# Patient Record
Sex: Female | Born: 1955
Health system: Southern US, Community
[De-identification: ages and names within clinical notes are randomized; demographics above are authoritative.]

## PROBLEM LIST (undated history)

## (undated) DIAGNOSIS — F32A Depression, unspecified: Secondary | ICD-10-CM

## (undated) DIAGNOSIS — K429 Umbilical hernia without obstruction or gangrene: Secondary | ICD-10-CM

## (undated) DIAGNOSIS — K219 Gastro-esophageal reflux disease without esophagitis: Secondary | ICD-10-CM

## (undated) DIAGNOSIS — C4491 Basal cell carcinoma of skin, unspecified: Secondary | ICD-10-CM

## (undated) DIAGNOSIS — J449 Chronic obstructive pulmonary disease, unspecified: Secondary | ICD-10-CM

## (undated) DIAGNOSIS — M1990 Unspecified osteoarthritis, unspecified site: Secondary | ICD-10-CM

## (undated) DIAGNOSIS — IMO0001 Reserved for inherently not codable concepts without codable children: Secondary | ICD-10-CM

## (undated) DIAGNOSIS — F419 Anxiety disorder, unspecified: Secondary | ICD-10-CM

## (undated) DIAGNOSIS — F329 Major depressive disorder, single episode, unspecified: Secondary | ICD-10-CM

## (undated) DIAGNOSIS — C50919 Malignant neoplasm of unspecified site of unspecified female breast: Secondary | ICD-10-CM

## (undated) HISTORY — PX: CHOLECYSTECTOMY: SHX55

## (undated) HISTORY — DX: Major depressive disorder, single episode, unspecified: F32.9

## (undated) HISTORY — DX: Basal cell carcinoma of skin, unspecified: C44.91

## (undated) HISTORY — PX: TUBAL LIGATION: SHX77

## (undated) HISTORY — DX: Chronic obstructive pulmonary disease, unspecified: J44.9

## (undated) HISTORY — DX: Umbilical hernia without obstruction or gangrene: K42.9

## (undated) HISTORY — DX: Malignant neoplasm of unspecified site of unspecified female breast: C50.919

## (undated) HISTORY — DX: Depression, unspecified: F32.A

---

## 2012-05-07 ENCOUNTER — Other Ambulatory Visit (HOSPITAL_COMMUNITY): Payer: Self-pay | Admitting: *Deleted

## 2012-05-22 ENCOUNTER — Encounter (HOSPITAL_COMMUNITY): Payer: Self-pay

## 2012-06-05 ENCOUNTER — Ambulatory Visit (HOSPITAL_COMMUNITY)
Admission: RE | Admit: 2012-06-05 | Discharge: 2012-06-05 | Disposition: A | Discharge status: Home / Self Care | Payer: Self-pay | Source: Ambulatory Visit | Attending: *Deleted | Admitting: *Deleted

## 2012-06-05 ENCOUNTER — Other Ambulatory Visit (HOSPITAL_COMMUNITY): Payer: Self-pay | Admitting: *Deleted

## 2012-06-05 DIAGNOSIS — N63 Unspecified lump in unspecified breast: Secondary | ICD-10-CM | POA: Insufficient documentation

## 2012-06-06 ENCOUNTER — Other Ambulatory Visit: Payer: Self-pay | Admitting: Obstetrics and Gynecology

## 2012-06-06 DIAGNOSIS — N6310 Unspecified lump in the right breast, unspecified quadrant: Secondary | ICD-10-CM

## 2012-06-11 ENCOUNTER — Encounter (HOSPITAL_COMMUNITY): Payer: Self-pay

## 2012-06-11 ENCOUNTER — Ambulatory Visit
Admission: RE | Admit: 2012-06-11 | Discharge: 2012-06-11 | Disposition: A | Discharge status: Home / Self Care | Payer: No Typology Code available for payment source | Source: Ambulatory Visit | Attending: Obstetrics and Gynecology | Admitting: Obstetrics and Gynecology

## 2012-06-11 ENCOUNTER — Ambulatory Visit (HOSPITAL_COMMUNITY)
Admission: RE | Admit: 2012-06-11 | Discharge: 2012-06-11 | Disposition: A | Discharge status: Home / Self Care | Payer: Self-pay | Source: Ambulatory Visit | Attending: Obstetrics and Gynecology | Admitting: Obstetrics and Gynecology

## 2012-06-11 VITALS — BP 118/76 | Ht 67.5 in | Wt 246.6 lb

## 2012-06-11 DIAGNOSIS — N6310 Unspecified lump in the right breast, unspecified quadrant: Secondary | ICD-10-CM

## 2012-06-11 DIAGNOSIS — Z1239 Encounter for other screening for malignant neoplasm of breast: Secondary | ICD-10-CM

## 2012-06-11 HISTORY — DX: Chronic obstructive pulmonary disease, unspecified: J44.9

## 2012-06-11 NOTE — Patient Instructions (Signed)
Taught patient how to perform BSE. Patient did not need a Pap smear today due to last Pap smear was 04/04/2012. Let her know [facility] will cover Pap smears every 3 years unless has a history of abnormal Pap smears. Referred patient to the [facility] for right breast biopsy per recommendation. Appointment scheduled today, Jun 11, 2012 at 1400. Patient aware of appointment and will be there. Patient verbalized understanding.

## 2012-06-11 NOTE — Progress Notes (Signed)
Complaints of right breast lump since December 2013 that patient stated has increased in size. Patient states it is painful at times and she rates the pain at a 2 out of 10. Patient had a diagnostic mammogram on 06/05/2012 at [facility].  Pap Smear:    Pap smear not completed today. Last Pap smear was 04/04/2012 and normal. Per patient no history of an abnormal Pap smear. Last Pap smear result is in EPIC.  Physical exam: Breasts Breasts symmetrical. No skin abnormalities left breast. Right breast dry and patchy red areas around breast. No nipple retraction bilateral breasts. No nipple discharge bilateral breasts. No lymphadenopathy. No lumps palpated left breast. Palpated lump within right breast at 12 o'clock 6 cm from the nipple. Patient complained of tenderness when palpated lump.  Referred patient to the [facility] for right breast biopsy per recommendation. Appointment scheduled today, Jun 11, 2012 at 1400.  Pelvic/Bimanual No Pap smear completed today since last Pap smear was 04/04/2012. Pap smear not indicated per [facility]CP guidelines.

## 2012-06-12 ENCOUNTER — Other Ambulatory Visit (INDEPENDENT_AMBULATORY_CARE_PROVIDER_SITE_OTHER): Payer: Self-pay | Admitting: General Surgery

## 2012-06-12 ENCOUNTER — Other Ambulatory Visit (INDEPENDENT_AMBULATORY_CARE_PROVIDER_SITE_OTHER): Payer: Self-pay | Admitting: Surgery

## 2012-06-12 DIAGNOSIS — C50911 Malignant neoplasm of unspecified site of right female breast: Secondary | ICD-10-CM

## 2012-06-13 ENCOUNTER — Other Ambulatory Visit: Payer: Self-pay | Admitting: *Deleted

## 2012-06-18 ENCOUNTER — Telehealth (INDEPENDENT_AMBULATORY_CARE_PROVIDER_SITE_OTHER): Payer: Self-pay | Admitting: General Surgery

## 2012-06-18 ENCOUNTER — Telehealth (INDEPENDENT_AMBULATORY_CARE_PROVIDER_SITE_OTHER): Payer: Self-pay

## 2012-06-18 ENCOUNTER — Ambulatory Visit (HOSPITAL_COMMUNITY): Payer: Self-pay

## 2012-06-18 ENCOUNTER — Ambulatory Visit
Admission: RE | Admit: 2012-06-18 | Discharge: 2012-06-18 | Disposition: A | Discharge status: Home / Self Care | Payer: No Typology Code available for payment source | Source: Ambulatory Visit | Attending: Surgery | Admitting: Surgery

## 2012-06-18 DIAGNOSIS — C50911 Malignant neoplasm of unspecified site of right female breast: Secondary | ICD-10-CM

## 2012-06-18 NOTE — Telephone Encounter (Signed)
Tracy Neal with [facility] states MRI could not be done on this patient.  # [phone]

## 2012-06-18 NOTE — Telephone Encounter (Signed)
Tracy Neal from [facility] called and stated that patient could do the MRI she started to feel sick and very nerves and Tracy Neal told to see her Family Dr to see if they will give her something for her nerves and if they do, she will re-schedule her MRI

## 2012-06-19 ENCOUNTER — Other Ambulatory Visit (HOSPITAL_BASED_OUTPATIENT_CLINIC_OR_DEPARTMENT_OTHER): Payer: Medicaid Other | Admitting: Lab

## 2012-06-19 ENCOUNTER — Ambulatory Visit
Admission: RE | Admit: 2012-06-19 | Discharge: 2012-06-19 | Disposition: A | Discharge status: Home / Self Care | Payer: No Typology Code available for payment source | Source: Ambulatory Visit | Attending: Radiation Oncology | Admitting: Radiation Oncology

## 2012-06-19 ENCOUNTER — Encounter: Payer: Self-pay | Admitting: *Deleted

## 2012-06-19 ENCOUNTER — Ambulatory Visit (HOSPITAL_BASED_OUTPATIENT_CLINIC_OR_DEPARTMENT_OTHER): Payer: Medicaid Other | Admitting: Surgery

## 2012-06-19 ENCOUNTER — Ambulatory Visit: Payer: No Typology Code available for payment source

## 2012-06-19 ENCOUNTER — Encounter (INDEPENDENT_AMBULATORY_CARE_PROVIDER_SITE_OTHER): Payer: Self-pay | Admitting: Surgery

## 2012-06-19 ENCOUNTER — Encounter: Payer: Self-pay | Admitting: Oncology

## 2012-06-19 ENCOUNTER — Ambulatory Visit: Payer: Medicaid Other | Attending: Surgery | Admitting: Physical Therapy

## 2012-06-19 ENCOUNTER — Ambulatory Visit (HOSPITAL_BASED_OUTPATIENT_CLINIC_OR_DEPARTMENT_OTHER): Payer: Medicaid Other | Admitting: Oncology

## 2012-06-19 VITALS — BP 115/73 | HR 80 | Temp 97.8°F | Resp 20 | Ht 67.5 in | Wt 246.7 lb

## 2012-06-19 DIAGNOSIS — C50119 Malignant neoplasm of central portion of unspecified female breast: Secondary | ICD-10-CM | POA: Insufficient documentation

## 2012-06-19 DIAGNOSIS — Z170 Estrogen receptor positive status [ER+]: Secondary | ICD-10-CM

## 2012-06-19 DIAGNOSIS — C50111 Malignant neoplasm of central portion of right female breast: Secondary | ICD-10-CM

## 2012-06-19 DIAGNOSIS — C50919 Malignant neoplasm of unspecified site of unspecified female breast: Secondary | ICD-10-CM

## 2012-06-19 DIAGNOSIS — G8929 Other chronic pain: Secondary | ICD-10-CM

## 2012-06-19 DIAGNOSIS — C50911 Malignant neoplasm of unspecified site of right female breast: Secondary | ICD-10-CM

## 2012-06-19 DIAGNOSIS — N959 Unspecified menopausal and perimenopausal disorder: Secondary | ICD-10-CM

## 2012-06-19 DIAGNOSIS — M6281 Muscle weakness (generalized): Secondary | ICD-10-CM | POA: Insufficient documentation

## 2012-06-19 DIAGNOSIS — R293 Abnormal posture: Secondary | ICD-10-CM | POA: Insufficient documentation

## 2012-06-19 DIAGNOSIS — IMO0001 Reserved for inherently not codable concepts without codable children: Secondary | ICD-10-CM | POA: Insufficient documentation

## 2012-06-19 LAB — CBC WITH DIFFERENTIAL/PLATELET
BASO%: 0.7 % (ref 0.0–2.0)
Eosinophils Absolute: 0.2 10*3/uL (ref 0.0–0.5)
HCT: 43.6 % (ref 34.8–46.6)
LYMPH%: 29.2 % (ref 14.0–49.7)
MCHC: 33.1 g/dL (ref 31.5–36.0)
MONO#: 1.2 10*3/uL — ABNORMAL HIGH (ref 0.1–0.9)
NEUT#: 5.6 10*3/uL (ref 1.5–6.5)
NEUT%: 55.9 % (ref 38.4–76.8)
Platelets: 256 10*3/uL (ref 145–400)
RBC: 4.94 10*6/uL (ref 3.70–5.45)
WBC: 10 10*3/uL (ref 3.9–10.3)
lymph#: 2.9 10*3/uL (ref 0.9–3.3)

## 2012-06-19 LAB — COMPREHENSIVE METABOLIC PANEL (CC13)
ALT: 17 U/L (ref 0–55)
CO2: 26 mEq/L (ref 22–29)
Calcium: 9.4 mg/dL (ref 8.4–10.4)
Chloride: 106 mEq/L (ref 98–107)
Glucose: 88 mg/dl (ref 70–99)
Sodium: 140 mEq/L (ref 136–145)
Total Bilirubin: 0.35 mg/dL (ref 0.20–1.20)
Total Protein: 7.1 g/dL (ref 6.4–8.3)

## 2012-06-19 NOTE — Progress Notes (Signed)
[facility] Radiation Oncology NEW PATIENT EVALUATION  Name: Tracy Neal MRN: [id number]  Date:   06/19/2012           DOB: 1955-05-08  Status: outpatient   CC: MUSE,ROCHELLE D., PA-C  Shelly Rubenstein, MD Dr. Lance Bosch   REFERRING PHYSICIAN: Shelly Rubenstein, MD   DIAGNOSIS:  Stage IIA (T2 N0 M0) invasive ductal carcinoma of the right breast  HISTORY OF PRESENT ILLNESS:  Tracy Neal is a 57 y.o. female who is seen today at the [facility] for the courtesy of Dr. Magnus Ivan for evaluation of a possible radiation therapy following conservative surgery in the management of her T2 N0 invasive ductal carcinoma of the right breast. She noted a breast mass at 12:00 approximately 6 months ago. She was referred by the [facility] to the Breast Center for mammography on 06/05/2012. She was found to have a 2.8 x 2.8 x 2.8 cm spiculated mass within the middle third of the breast at 12:00. There were no calcifications. On ultrasound the mass measured 2.7 x 1.4 cm. She underwent ultrasound guided core biopsy on 06/11/2012 and this was diagnostic for invasive ductal carcinoma which was ER positive at 90% and PR negative. Ki-67 was 15%. She was too claustrophobic to complete a MRI scan. She is without complaints today. She is seen today with Dr. Magnus Ivan and Dr. Darnelle Catalan.  PREVIOUS RADIATION THERAPY: No   PAST MEDICAL HISTORY:  has a past medical history of COPD (chronic obstructive pulmonary disease); Breast cancer; and Depression.     PAST SURGICAL HISTORY:  Past Surgical History  Procedure Laterality Date  . Cholecystectomy       FAMILY HISTORY: family history includes Diabetes in her mother. her mother died from complications of multiple sclerosis and a stroke at age 7. Her father died from complications of alcoholism at age 61. No family history of breast cancer.   SOCIAL HISTORY:  reports that she has been smoking Cigarettes.  She has a 40 pack-year  smoking history. She has never used smokeless tobacco. She reports that she does not drink alcohol or use illicit drugs. Divorced, 2 sons ages 11 and 45. 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 Currently unemployed.   ALLERGIES: Review of patient's allergies indicates no known allergies.   MEDICATIONS:  Current Outpatient Prescriptions  Medication Sig Dispense Refill  . acetaminophen (TYLENOL) 500 MG tablet Take 500 mg by mouth every 6 (six) hours as needed for pain.      Marland Kitchen albuterol-ipratropium (COMBIVENT) 18-103 MCG/ACT inhaler Inhale 2 puffs into the lungs every 6 (six) hours as needed for wheezing.      . famotidine (PEPCID) 10 MG tablet Take 10 mg by mouth 2 (two) times daily.       No current facility-administered medications for this encounter.     REVIEW OF SYSTEMS:  Pertinent items are noted in HPI.    PHYSICAL EXAM: Alert and oriented 57 year old white female appearing her stated age.  Wt Readings from Last 3 Encounters:  06/19/12 246 lb 11.2 oz (111.902 kg)   Temp Readings from Last 3 Encounters:  06/19/12 97.8 F (36.6 C) Oral   BP Readings from Last 3 Encounters:  06/19/12 115/73   Pulse Readings from Last 3 Encounters:  06/19/12 80   Head and neck examination: Grossly unremarkable except for a 1.8 x 1.2 cm basal cell carcinoma along the medial aspect of her right eye.. Nodes: Without palpable cervical,  supraclavicular, or axillary lymphadenopathy. Chest: Lungs clear. Back: Without spinal or CVA tenderness. Breasts: There is a 2.5 cm mass palpable at approximately 12:00 along the superior aspect of the right breast. There is a small area of ecchymosis related to her biopsy. No other masses are appreciated. Left breast without masses or lesions. Abdomen: Without hepatomegaly. Extremities: Without edema. Neurologic examination: Grossly nonfocal.   LABORATORY DATA:  Lab Results  Component Value Date   WBC 10.0 06/19/2012   HGB 14.4 06/19/2012   HCT 43.6  06/19/2012   MCV 88.2 06/19/2012   PLT 256 06/19/2012   Lab Results  Component Value Date   NA 140 06/19/2012   K 3.8 06/19/2012   CL 106 06/19/2012   CO2 26 06/19/2012   Lab Results  Component Value Date   ALT 17 06/19/2012   AST 17 06/19/2012   ALKPHOS 84 06/19/2012   BILITOT 0.35 06/19/2012      IMPRESSION: Stage IIA (T2 N0 M0) invasive ductal carcinoma of the right breast. I explained to the patient that her local treatment options include mastectomy versus partial mastectomy followed by radiation therapy. She desires breast preservation. We discussed the potential acute and late toxicities of radiation therapy. She lives in [place], and conserving have her radiation therapy closer to home. She may be a candidate for hypo-fractionated radiation therapy depending on her anatomy. Dr. Darnelle Catalan will order Oncotype DX testing. I can refer her to Dr. Lance Bosch in [place] following her definitive surgery and recommendations regarding systemic therapy. With respect to her basal cell carcinoma along medial aspect of her right eye, she could be a candidate for curative radiation therapy which would potentially offer her better cosmesis. This could also be performed in [place]. She'll visit the dermatology Department at [facility] where she has an appointment that has been postponed because of her visit to the multidisciplinary breast clinic today.   PLAN: As discussed above  I spent 40 minutes minutes face to face with the patient and more than 50% of that time was spent in counseling and/or coordination of care.

## 2012-06-19 NOTE — Progress Notes (Signed)
Patient ID: Tracy Neal, female   DOB: 1955/03/04, 57 y.o.   MRN: [id number]  Chief Complaint  Patient presents with  . Other    right breast cancer    HPI Tracy Neal is a 57 y.o. female.   HPI This is a pleasant female referred by Fonnie Mu NP from the health department for evaluation of a right breast mass. She has had a stereotactic biopsy confirming an invasive breast cancer. She reports that she could feel the mass more than 6 months ago. She denies any nipple discharge. She has no previous problems regarding her breast. She is otherwise without complaints Past Medical History  Diagnosis Date  . COPD (chronic obstructive pulmonary disease)   . Breast cancer   . Depression     Past Surgical History  Procedure Laterality Date  . Cholecystectomy      Family History  Problem Relation Age of Onset  . Diabetes Mother     Social History History  Substance Use Topics  . Smoking status: Current Every Day Smoker -- 1.00 packs/day for 40 years    Types: Cigarettes  . Smokeless tobacco: Never Used  . Alcohol Use: No    No Known Allergies  Current Outpatient Prescriptions  Medication Sig Dispense Refill  . acetaminophen (TYLENOL) 500 MG tablet Take 500 mg by mouth every 6 (six) hours as needed for pain.      Marland Kitchen albuterol-ipratropium (COMBIVENT) 18-103 MCG/ACT inhaler Inhale 2 puffs into the lungs every 6 (six) hours as needed for wheezing.      . famotidine (PEPCID) 10 MG tablet Take 10 mg by mouth 2 (two) times daily.       No current facility-administered medications for this visit.    Review of Systems Review of Systems  Constitutional: Negative for fever, chills and unexpected weight change.  HENT: Negative for hearing loss, congestion, sore throat, trouble swallowing and voice change.   Eyes: Negative for visual disturbance.  Respiratory: Positive for shortness of breath. Negative for cough and wheezing.   Cardiovascular: Negative for chest pain,  palpitations and leg swelling.  Gastrointestinal: Negative for nausea, vomiting, abdominal pain, diarrhea, constipation, blood in stool, abdominal distention and anal bleeding.  Genitourinary: Negative for hematuria, vaginal bleeding and difficulty urinating.  Musculoskeletal: Positive for back pain and arthralgias.  Skin: Negative for rash and wound.  Neurological: Negative for seizures, syncope and headaches.  Hematological: Negative for adenopathy. Does not bruise/bleed easily.  Psychiatric/Behavioral: Negative for confusion.    There were no vitals taken for this visit.  Physical Exam Physical Exam  Constitutional: She is oriented to person, place, and time. She appears well-developed and well-nourished. No distress.  HENT:  Head: Normocephalic and atraumatic.  Right Ear: External ear normal.  Left Ear: External ear normal.  Nose: Nose normal.  Mouth/Throat: Oropharynx is clear and moist. No oropharyngeal exudate.  Eyes: Conjunctivae are normal. Pupils are equal, round, and reactive to light. Right eye exhibits no discharge. Left eye exhibits no discharge. No scleral icterus.  Neck: Normal range of motion. Neck supple. No tracheal deviation present. No thyromegaly present.  Cardiovascular: Normal rate, regular rhythm, normal heart sounds and intact distal pulses.   No murmur heard. Pulmonary/Chest: Effort normal and breath sounds normal. No respiratory distress. She has no wheezes. She has no rales.  Abdominal: Soft. Bowel sounds are normal. She exhibits no distension. There is no tenderness. There is no rebound.  Musculoskeletal: Normal range of motion. She exhibits no edema and no tenderness.  Lymphadenopathy:    She has no cervical adenopathy.  Neurological: She is alert and oriented to person, place, and time.  Skin: Skin is warm and dry. No rash noted. No erythema.  Psychiatric: Her behavior is normal. Judgment normal.  Breast: There is a 2 cm mass which is mobile at the  12:00 position of the right breast. It is soft. There is ecchymosis from her biopsy. There are no palpable nodes on either side. There are no other breast masses in either breast  Data Reviewed Her mammograms demonstrates a mass at the 12:00 position. It is approximately 2.8 cm on mammogram and 2.7 cm on ultrasound. The final pathology revealed an ER positive PR negative invasive breast cancer  Assessment    Invasive right breast cancer     Plan    After discussion in the multidisciplinary breast conference, we are recommending a right breast lumpectomy and sentinel biopsy. I discussed this with her in detail. I discussed the surgical procedure as well as its risks. These include but are not limited to bleeding, infection, injury to [facility] structures, need for further surgery at the margins or nodes are positive, et Karie Soda.  She understands and wishes to proceed. Surgery will be scheduled.        Wafaa Deemer A 06/19/2012, 9:54 AM

## 2012-06-19 NOTE — Progress Notes (Signed)
The patient doesn't have a living will/POA. I checked in new patient with no financial issues. She didn't have the [facility] form.

## 2012-06-19 NOTE — Progress Notes (Signed)
ID: Tracy Neal OB: Apr 29, 1955  MR#: [id number]  CSN#:[id number]  PCP: MUSE,ROCHELLE D., PA-C GYN:   SU: Abigail Miyamoto OTHER MD: Chipper Herb, Baird Lyons   HISTORY OF PRESENT ILLNESS: "Tracy Neal" tells me she herself found a lump in her right breast December of 2013. She brought it to the attention of the [facility] health department and they set her up for mammography at the [facility] 06/05/2012. This found her breasts to be almost entirely fat replaced. There was a 2.8 cm spiculated mass in the middle third of the right breast, with no microcalcifications. This mass was palpable at 12:00 6 cm from the nipple. Ultrasound confirmed a 2.7 cm irregular mass at the area in question. Otherwise ultrasound of the right axilla was benign.  Biopsy of the right breast mass 06/11/2012 ([id number]) showed an invasive ductal carcinoma, grade 2, estrogen receptor 90% positive, progesterone receptor negative, HER-2 not amplified, with an MIB-1 of 15%. The patient was set up for breast MRI, but was unable to tolerate the procedure because of claustrophobia. Her subsequent history is as detailed below.  INTERVAL HISTORY: Tracy Neal was seen at the multidisciplinary breast cancer clinic 06/19/2012.  REVIEW OF SYSTEMS: There were no specific symptoms leading to the mammogram other than the mass itself. The patient does have chronic back pain and pain in her knees. This has been evaluated locally and she is waiting on a disability determination. She has more chills and hot flashes. She gets short of breath particularly when walking up stairs. She is forgetful and depressed. She has problems with heartburn. A detailed review of systems today was otherwise noncontributory  PAST MEDICAL HISTORY: Past Medical History  Diagnosis Date  . COPD (chronic obstructive pulmonary disease)   . Breast cancer   . Depression   . Umbilical hernia   . COPD, moderate   . Basal cell carcinoma     not biopsy confirmed; Right  bridge of nose    PAST SURGICAL HISTORY: Past Surgical History  Procedure Laterality Date  . Cholecystectomy      FAMILY HISTORY Family History  Problem Relation Age of Onset  . Diabetes Mother    the patient's father committed suicide at the age of 38. The patient's mother died when the patient was age 65. The patient was adopted by her uncle, who died at age 71. Her adopted mother died at age 72 with MS. The patient has one full sister, 2 half-sisters and 4 half-brothers. No one in the family has a history of cancer to the patient's knowledge  GYNECOLOGIC HISTORY:  Menarche age 9, first live birth age 68. She is GX P2 She stopped having periods about 1988. She never took hormone replacement. She never took birth control pills.  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 She is divorced. She lives alone, with no pets in an [place] "project." Her son [name] lives in [place] and works in Aeronautical engineer. The patient's other her son was put up for adoption by the patient's former husband (they had each been granted custody of one child). She tells me that he is in touch with him, his name is [name], and he lives in [place], where he works at detailing cars. The patient has 3 granddaughters. She attends a [employer]    ADVANCED DIRECTIVES: Not in place. The patient was not able  to give me a clear answer of whom she would like to make medical decisions for her if that became necessary. She will consider this further and let us know at the next visit.   HEALTH MAINTENANCE: History  Substance Use Topics  . Smoking status: Current Every Day Smoker -- 1.00 packs/day for 40 years    Types: Cigarettes  . Smokeless tobacco: Never Used  . Alcohol Use: No     Colonoscopy: Never  PAP: April 2014  Bone density: Never  Lipid  panel:  No Known Allergies  Current Outpatient Prescriptions  Medication Sig Dispense Refill  . acetaminophen (TYLENOL) 500 MG tablet Take 500 mg by mouth every 6 (six) hours as needed for pain.      Marland Kitchen albuterol-ipratropium (COMBIVENT) 18-103 MCG/ACT inhaler Inhale 2 puffs into the lungs every 6 (six) hours as needed for wheezing.      . famotidine (PEPCID) 10 MG tablet Take 10 mg by mouth 2 (two) times daily.       No current facility-administered medications for this visit.    OBJECTIVE: Middle-aged white woman in no acute distress Filed Vitals:   06/19/12 0841  BP: 115/73  Pulse: 80  Temp: 97.8 F (36.6 C)  Resp: 20     Body mass index is 38.05 kg/(m^2).    ECOG FS: 2  Sclerae unicteric Oropharynx clear No cervical or supraclavicular adenopathy Lungs no rales or rhonchi, no wheezes, fair excursion bilaterally Heart regular rate and rhythm, no murmur appreciated Abd obese, obvious umbilical hernia, no bowel sounds in hernia MSK no focal spinal tenderness Neuro: non-focal, well-oriented, friendly affect Breasts: The right breast is status post recent biopsy. There is a palpable mass which may be related to the recent procedure. I do not palpate any axillary adenopathy. The left breast is unremarkable   LAB RESULTS:  CMP     Component Value Date/Time   NA 140 06/19/2012 0830   K 3.8 06/19/2012 0830   CL 106 06/19/2012 0830   CO2 26 06/19/2012 0830   GLUCOSE 88 06/19/2012 0830   BUN 13.9 06/19/2012 0830   CREATININE 0.8 06/19/2012 0830   CALCIUM 9.4 06/19/2012 0830   PROT 7.1 06/19/2012 0830   ALBUMIN 3.5 06/19/2012 0830   AST 17 06/19/2012 0830   ALT 17 06/19/2012 0830   ALKPHOS 84 06/19/2012 0830   BILITOT 0.35 06/19/2012 0830    I No results found for this basename: SPEP,  UPEP,   kappa and lambda light chains    Lab Results  Component Value Date   WBC 10.0 06/19/2012   NEUTROABS 5.6 06/19/2012   HGB 14.4 06/19/2012   HCT 43.6 06/19/2012   MCV 88.2 06/19/2012   PLT 256 06/19/2012       Chemistry      Component Value Date/Time   NA 140 06/19/2012 0830   K 3.8 06/19/2012 0830   CL 106 06/19/2012 0830   CO2 26 06/19/2012 0830   BUN 13.9 06/19/2012 0830   CREATININE 0.8 06/19/2012 0830      Component Value Date/Time   CALCIUM 9.4 06/19/2012 0830   ALKPHOS 84 06/19/2012 0830   AST 17 06/19/2012 0830   ALT 17 06/19/2012 0830   BILITOT 0.35 06/19/2012 0830       No results found for this basename: LABCA2    No components found with this basename: LABCA125    No results found for this basename: INR,  in the last 168 hours  Urinalysis No results  found for this basename: colorurine,  appearanceur,  labspec,  phurine,  glucoseu,  hgbur,  bilirubinur,  ketonesur,  proteinur,  urobilinogen,  nitrite,  leukocytesur    STUDIES: US Breast Right  06/05/2012   *RADIOLOGY REPORT*  Clinical Data:  Palpable right breast mass.  DIGITAL DIAGNOSTIC BILATERAL MAMMOGRAM WITH CAD AND RIGHT BREAST ULTRASOUND:  Comparison:  None.  Findings:  ACR Breast Density Category 1: The breast tissue is almost entirely fatty.  There is a 2.8 x 2.8 x 2.8 cm spiculated mass in the middle third of the 12 o'clock region of the right breast.  There is no associated malignant-type microcalcifications.  The left breast is negative.  Mammographic images were processed with CAD.  On physical exam, I palpate thickening in the right breast at 12 o'clock 6 cm from the nipple.  Ultrasound is performed, showing there is a hypoechoic, irregular mass with shadowing in the right breast at 12 o'clock 6 cm from the nipple measuring 1.4 x 2.7 cm.  Sonographic evaluation of the right axilla fails to reveal any enlarged adenopathy.  IMPRESSION: Suspicious right breast mass.  Tissue sampling is recommended.  RECOMMENDATION: Ultrasound guided core biopsy of the right breast is recommended. The patient states that she needs to fill out financial paperwork before at the biopsy can be scheduled.  I have discussed the findings and recommendations with  the patient. Results were also provided in writing at the conclusion of the visit.  If applicable, a reminder letter will be sent to the patient regarding the next appointment.  BI-RADS CATEGORY 5:  Highly suggestive of malignancy - appropriate action should be taken.   Original Report Authenticated By: Baird Lyons, M.D.   Mm Digital Diagnostic Bilat  06/05/2012   *RADIOLOGY REPORT*  Clinical Data:  Palpable right breast mass.  DIGITAL DIAGNOSTIC BILATERAL MAMMOGRAM WITH CAD AND RIGHT BREAST ULTRASOUND:  Comparison:  None.  Findings:  ACR Breast Density Category 1: The breast tissue is almost entirely fatty.  There is a 2.8 x 2.8 x 2.8 cm spiculated mass in the middle third of the 12 o'clock region of the right breast.  There is no associated malignant-type microcalcifications.  The left breast is negative.  Mammographic images were processed with CAD.  On physical exam, I palpate thickening in the right breast at 12 o'clock 6 cm from the nipple.  Ultrasound is performed, showing there is a hypoechoic, irregular mass with shadowing in the right breast at 12 o'clock 6 cm from the nipple measuring 1.4 x 2.7 cm.  Sonographic evaluation of the right axilla fails to reveal any enlarged adenopathy.  IMPRESSION: Suspicious right breast mass.  Tissue sampling is recommended.  RECOMMENDATION: Ultrasound guided core biopsy of the right breast is recommended. The patient states that she needs to fill out financial paperwork before at the biopsy can be scheduled.  I have discussed the findings and recommendations with the patient. Results were also provided in writing at the conclusion of the visit.  If applicable, a reminder letter will be sent to the patient regarding the next appointment.  BI-RADS CATEGORY 5:  Highly suggestive of malignancy - appropriate action should be taken.   Original Report Authenticated By: Baird Lyons, M.D.   Mm Digital Diagnostic Unilat R  06/11/2012   *RADIOLOGY REPORT*  Clinical Data:  Status  post ultrasound-guided core biopsy of a right breast mass  DIGITAL DIAGNOSTIC RIGHT MAMMOGRAM  Comparison:  With priors  Findings:  Films are performed following ultrasound guided biopsy of  a mass in the 12 o'clock region of the right breast. Mammographic images showed there is a spiculated mass in the 12 o'clock region of the right breast.  There is a ribbon shaped clip at the lateral margin of the mass.  IMPRESSION: Status post ultrasound-guided core biopsy of the right breast with pathology pending.   Original Report Authenticated By: Baird Lyons, M.D.   [place] Rt Breast Bx W Loc Dev 1st Lesion Img Bx Spec US Guide  06/12/2012   **ADDENDUM** CREATED: 06/12/2012 11:18:31  The pathology associated with the right breast ultrasound guided core biopsy demonstrated invasive ductal carcinoma with DCIS.  This is concordant with imaging findings.  I have discussed the findings with the patient by telephone and answered her questions.  The patient states her biopsy site is clean and dry without hematoma formation.  There is mild tenderness.  The patient is scheduled for the breast cancer multidisciplinary clinic on 06/19/2012.  Breast MRI will be scheduled.  Post biopsy wound care instructions were reviewed with the patient.  The patient was encouraged to call the Breast Center for additional questions or concerns.  **END ADDENDUM** SIGNED BY: Rolla Plate, M.D.  06/11/2012   *RADIOLOGY REPORT*  Clinical Data:  Suspicious right breast mass  ULTRASOUND GUIDED VACUUM ASSISTED CORE BIOPSY OF THE RIGHT BREAST  Comparison: Previous exams.  I met with the patient and we discussed the procedure of ultrasound- guided biopsy, including benefits and alternatives.  We discussed the high likelihood of a successful procedure. We discussed the risks of the procedure including infection, bleeding, tissue injury, clip migration, and inadequate sampling.  Informed written consent was given.  Using sterile technique, 2% lidocaine  ultrasound guidance and a 12 gauge vacuum assisted needle biopsy was performed of a mass in the 12 o'clock region of the right breast using an inferior lateral approach.  At the conclusion of the procedure, a ribbon shaped tissue marker clip was deployed into the biopsy cavity.  Follow-up 2-view mammogram was performed and dictated separately.  IMPRESSION: Ultrasound-guided biopsy of the right breast.  No apparent complications.   Original Report Authenticated By: Baird Lyons, M.D.    ASSESSMENT: 57 y.o. [place], [place] woman status post right breast biopsy 06/11/2012 for a clinical T2 N0, stage IIA invasive ductal carcinoma, grade 2, estrogen receptor 90%, progesterone receptor negative, HER-2 not amplified, with an MIB-1 of 15%  PLAN: We spent the better part of today's hour-long visit discussing the biology of breast cancer in general and the specifics of Tracy Neal's owns situation. She understands she will benefit from surgery and radiation as well as anti-estrogens. The question is whether she would benefit sufficiently from chemotherapy and to help [place] make that decision we will send an Oncotype DX test from her surgical sample. I have made her return appointment with me for approximately 3 weeks after surgery so we can make a definitive chemotherapy decision although at least at this point Tracy Neal is strongly inclined against chemotherapy.  She has what looks to me like a basal cell tumor in the right bridge of her nose. I understand radiation oncology is considering treating this. She has also had films of her spine obtained at [facility] as part of her disability evaluation and we will try to obtain those for our records.  Otherwise she will see me again in approximately 3 weeks after her surgery to make a definitive chemotherapy dissection. Note that her radiation treatments can be most easily done in [place]. Tracy Neal has a  good understanding of all these plans and is very much in agreement. She knows to  call for any problems that may develop before her next visit here.    Lowella Dell, MD   06/19/2012 11:58 AM

## 2012-06-20 ENCOUNTER — Telehealth: Payer: Self-pay | Admitting: *Deleted

## 2012-06-20 NOTE — Telephone Encounter (Signed)
Lm gv appt d/t for 07/29/12 @ 2:30pm. i made her aware that i will mail a letter/cal as well...td

## 2012-06-24 ENCOUNTER — Telehealth: Payer: Self-pay | Admitting: *Deleted

## 2012-06-24 ENCOUNTER — Encounter: Payer: Self-pay | Admitting: Oncology

## 2012-06-24 NOTE — Progress Notes (Signed)
Per Leotis Shames, check on if minutes for phones are paid?? I called the patient and she is in process of filing [employer] medicaid. Martie Lee is bringing her some paperwork on Friday to fill out. I advised would check to see if we assist with minutes on Government phones and I would call her back. I verified her address.

## 2012-06-24 NOTE — Telephone Encounter (Signed)
I called patient in f/u to [facility] on 06/19/12 and to address questions she had regarding transportation assistance and help to add minutes to her government issued phone.  Patient reports that she is doing well at this time.  I gave her information on [facility] for assistance to come to her appointments.  I had earlier consulted the Fish farm manager regarding additional minutes for her phone.  Per her note, [name], Financial Advocate is investigating resources to assist patient with additional phone minutes.  We also discussed her plan of care and patient was able to teach back her current plan of care.  Patient denied any questions or concerns at this time.  I gave her my contact information and instructed her to call for any needs.  Patient verbalized understanding.

## 2012-06-26 ENCOUNTER — Other Ambulatory Visit (INDEPENDENT_AMBULATORY_CARE_PROVIDER_SITE_OTHER): Payer: Self-pay | Admitting: Surgery

## 2012-07-04 ENCOUNTER — Encounter (HOSPITAL_COMMUNITY): Payer: Self-pay | Admitting: Pharmacy Technician

## 2012-07-09 ENCOUNTER — Ambulatory Visit (HOSPITAL_COMMUNITY)
Admission: RE | Admit: 2012-07-09 | Discharge: 2012-07-09 | Disposition: A | Discharge status: Home / Self Care | Payer: Medicaid Other | Source: Ambulatory Visit | Attending: Anesthesiology | Admitting: Anesthesiology

## 2012-07-09 ENCOUNTER — Other Ambulatory Visit (HOSPITAL_COMMUNITY): Payer: Self-pay

## 2012-07-09 ENCOUNTER — Encounter (HOSPITAL_COMMUNITY): Payer: Self-pay

## 2012-07-09 ENCOUNTER — Encounter (HOSPITAL_COMMUNITY)
Admission: RE | Admit: 2012-07-09 | Discharge: 2012-07-09 | Disposition: A | Discharge status: Home / Self Care | Payer: Medicaid Other | Source: Ambulatory Visit | Attending: Surgery | Admitting: Surgery

## 2012-07-09 VITALS — BP 142/80 | HR 82 | Temp 97.7°F | Resp 20 | Ht 67.0 in | Wt 243.8 lb

## 2012-07-09 DIAGNOSIS — Z01818 Encounter for other preprocedural examination: Secondary | ICD-10-CM | POA: Insufficient documentation

## 2012-07-09 DIAGNOSIS — C50911 Malignant neoplasm of unspecified site of right female breast: Secondary | ICD-10-CM

## 2012-07-09 DIAGNOSIS — Z01812 Encounter for preprocedural laboratory examination: Secondary | ICD-10-CM | POA: Insufficient documentation

## 2012-07-09 DIAGNOSIS — J449 Chronic obstructive pulmonary disease, unspecified: Secondary | ICD-10-CM | POA: Insufficient documentation

## 2012-07-09 DIAGNOSIS — I517 Cardiomegaly: Secondary | ICD-10-CM | POA: Insufficient documentation

## 2012-07-09 DIAGNOSIS — J4489 Other specified chronic obstructive pulmonary disease: Secondary | ICD-10-CM | POA: Insufficient documentation

## 2012-07-09 DIAGNOSIS — Z01810 Encounter for preprocedural cardiovascular examination: Secondary | ICD-10-CM | POA: Insufficient documentation

## 2012-07-09 DIAGNOSIS — F17200 Nicotine dependence, unspecified, uncomplicated: Secondary | ICD-10-CM | POA: Insufficient documentation

## 2012-07-09 LAB — BASIC METABOLIC PANEL
BUN: 13 mg/dL (ref 6–23)
CO2: 28 mEq/L (ref 19–32)
Chloride: 101 mEq/L (ref 96–112)
GFR calc non Af Amer: 60 mL/min — ABNORMAL LOW (ref >90)
Glucose, Bld: 88 mg/dL (ref 70–99)
Potassium: 4.6 mEq/L (ref 3.5–5.1)
Sodium: 138 mEq/L (ref 135–145)

## 2012-07-09 LAB — CBC
HCT: 48.1 % — ABNORMAL HIGH (ref 36.0–46.0)
Hemoglobin: 16.5 g/dL — ABNORMAL HIGH (ref 12.0–15.0)
MCH: 30.2 pg (ref 26.0–34.0)
MCHC: 34.3 g/dL (ref 30.0–36.0)
MCV: 88.1 fL (ref 78.0–100.0)
RBC: 5.46 MIL/uL — ABNORMAL HIGH (ref 3.87–5.11)

## 2012-07-09 LAB — SURGICAL PCR SCREEN: MRSA, PCR: NEGATIVE

## 2012-07-09 NOTE — Pre-Procedure Instructions (Addendum)
Tracy Neal  07/09/2012   Your procedure is scheduled on:  July 15, 2012  Report to [facility] at 8 AM.  Call this number if you have problems the morning of surgery: [phone]   Remember:  Discontinue aspirin, coumadin, effient, plavix and herbal medications 5 days prior to surgery   Do not eat food or drink liquids after midnight.   Take these medicines the morning of surgery with A SIP OF WATER: tylenol as needed, inhaler as needed, pepcid as needed   Do not wear jewelry, make-up or nail polish.  Do not wear lotions, powders, or perfumes. You may wear deodorant.  Do not shave 48 hours prior to surgery. Men may shave face and neck.  Do not bring valuables to the hospital.  [facility] is not responsible                   for any belongings or valuables.  Contacts, dentures or bridgework may not be worn into surgery.  Leave suitcase in the car. After surgery it may be brought to your room.  For patients admitted to the hospital, checkout time is 11:00 AM the day of  discharge.   Patients discharged the day of surgery will not be allowed to drive  home.  Name and phone number of your driver: family/friend  Special Instructions: Shower using CHG 2 nights before surgery and the night before surgery.  If you shower the day of surgery use CHG.  Use special wash - you have one bottle of CHG for all showers.  You should use approximately 1/3 of the bottle for each shower.   Please read over the following fact sheets that you were given: Pain Booklet, Coughing and Deep Breathing and Surgical Site Infection Prevention

## 2012-07-10 NOTE — Progress Notes (Signed)
This is a 57 year old female who is scheduled for a right breast lumpectomy and sentinal node biopsy to be performed by Dr. Abigail Miyamoto on Monday 15 July 2012.   A chart review was requested due to an abnormal EKG dated 09 July 2012 which has been reviewed/confirmed by Dr. Laqueta Carina. It demonstrates normal sinus rhythm with a ventricular rate of 77 bpm, possible left atrial enlargement, borderline EKG. There are no old tracing for comparison and no other past cardiac work-up/evaluation has been performed.  This patient's past medical history is significant for breast cancer and COPD secondary to tobacco use/abuse, otherwise, there is no known history of coronary artery disease, valvular heart disease, hypertension or other significant medical disease. She did not voice any cardiac related symptoms, issues or complaints during her pre-admission evaluation.   CXR dated 09 July 2012 and compared to previous films dated 01 January 2011: unremarkable cardiopulmonary appearance with no new focal or acute abnormality seen.   May proceed with surgery as scheduled.    Kelton Pillar. Ileene Allie, PA-C

## 2012-07-11 ENCOUNTER — Telehealth: Payer: Self-pay | Admitting: *Deleted

## 2012-07-11 NOTE — Telephone Encounter (Signed)
I called patient to f/u and check in to see if she had questions or concerns prior to her surgery scheduled for 6/30.  Patient reports that she is doing well.  She has experienced some mild pain over the past 2 days at the biopsy site.  Patient was seen for preadmission testing and instruction on 6/25.  Patient reports that she has no questions regarding her instructions.  We reviewed her upcoming schedule of appointments.  Contact information given and patient was encouraged to call for any needs.

## 2012-07-12 NOTE — Progress Notes (Signed)
Notified patient of time change, she stated she could not come at 600am, but will try 700 am.  She has a friend from out of town bringing her.  Instructed patient to call dr. Eliberto Ivory office if she will not be able to come at earlier time.

## 2012-07-14 MED ORDER — CEFAZOLIN SODIUM-DEXTROSE 2-3 GM-% IV SOLR
2.0000 g | INTRAVENOUS | Status: AC
  Administered 2012-07-15: 2 g via INTRAVENOUS
  Filled 2012-07-14: qty 50

## 2012-07-14 NOTE — H&P (Signed)
Patient ID: Tracy Neal, female DOB: 1955/04/02, 57 y.o. MRN: [id number]  Chief Complaint   Patient presents with   .  Other     right breast cancer   HPI  Tracy Neal is a 57 y.o. female.  HPI  This is a pleasant female referred by Fonnie Mu NP from the health department for evaluation of a right breast mass. She has had a stereotactic biopsy confirming an invasive breast cancer. She reports that she could feel the mass more than 6 months ago. She denies any nipple discharge. She has no previous problems regarding her breast. She is otherwise without complaints  Past Medical History   Diagnosis  Date   .  COPD (chronic obstructive pulmonary disease)    .  Breast cancer    .  Depression     Past Surgical History   Procedure  Laterality  Date   .  Cholecystectomy      Family History   Problem  Relation  Age of Onset   .  Diabetes  Mother    Social History  History   Substance Use Topics   .  Smoking status:  Current Every Day Smoker -- 1.00 packs/day for 40 years     Types:  Cigarettes   .  Smokeless tobacco:  Never Used   .  Alcohol Use:  No   No Known Allergies  Current Outpatient Prescriptions   Medication  Sig  Dispense  Refill   .  acetaminophen (TYLENOL) 500 MG tablet  Take 500 mg by mouth every 6 (six) hours as needed for pain.     Marland Kitchen  albuterol-ipratropium (COMBIVENT) 18-103 MCG/ACT inhaler  Inhale 2 puffs into the lungs every 6 (six) hours as needed for wheezing.     .  famotidine (PEPCID) 10 MG tablet  Take 10 mg by mouth 2 (two) times daily.      No current facility-administered medications for this visit.   Review of Systems  Review of Systems  Constitutional: Negative for fever, chills and unexpected weight change.  HENT: Negative for hearing loss, congestion, sore throat, trouble swallowing and voice change.  Eyes: Negative for visual disturbance.  Respiratory: Positive for shortness of breath. Negative for cough and wheezing.  Cardiovascular:  Negative for chest pain, palpitations and leg swelling.  Gastrointestinal: Negative for nausea, vomiting, abdominal pain, diarrhea, constipation, blood in stool, abdominal distention and anal bleeding.  Genitourinary: Negative for hematuria, vaginal bleeding and difficulty urinating.  Musculoskeletal: Positive for back pain and arthralgias.  Skin: Negative for rash and wound.  Neurological: Negative for seizures, syncope and headaches.  Hematological: Negative for adenopathy. Does not bruise/bleed easily.  Psychiatric/Behavioral: Negative for confusion.  There were no vitals taken for this visit.  Physical Exam  Physical Exam  Constitutional: She is oriented to person, place, and time. She appears well-developed and well-nourished. No distress.  HENT:  Head: Normocephalic and atraumatic.  Right Ear: External ear normal.  Left Ear: External ear normal.  Nose: Nose normal.  Mouth/Throat: Oropharynx is clear and moist. No oropharyngeal exudate.  Eyes: Conjunctivae are normal. Pupils are equal, round, and reactive to light. Right eye exhibits no discharge. Left eye exhibits no discharge. No scleral icterus.  Neck: Normal range of motion. Neck supple. No tracheal deviation present. No thyromegaly present.  Cardiovascular: Normal rate, regular rhythm, normal heart sounds and intact distal pulses.  No murmur heard.  Pulmonary/Chest: Effort normal and breath sounds normal. No respiratory distress. She has no wheezes. She  has no rales.  Abdominal: Soft. Bowel sounds are normal. She exhibits no distension. There is no tenderness. There is no rebound.  Musculoskeletal: Normal range of motion. She exhibits no edema and no tenderness.  Lymphadenopathy:  She has no cervical adenopathy.  Neurological: She is alert and oriented to person, place, and time.  Skin: Skin is warm and dry. No rash noted. No erythema.  Psychiatric: Her behavior is normal. Judgment normal.  Breast: There is a 2 cm mass which  is mobile at the 12:00 position of the right breast. It is soft. There is ecchymosis from her biopsy. There are no palpable nodes on either side. There are no other breast masses in either breast   Data Reviewed  Her mammograms demonstrates a mass at the 12:00 position. It is approximately 2.8 cm on mammogram and 2.7 cm on ultrasound. The final pathology revealed an ER positive PR negative invasive breast cancer   Assessment  Invasive right breast cancer   Plan  After discussion in the multidisciplinary breast conference, we are recommending a right breast lumpectomy and sentinel biopsy. I discussed this with her in detail. I discussed the surgical procedure as well as its risks. These include but are not limited to bleeding, infection, injury to [facility] structures, need for further surgery at the margins or nodes are positive, et Karie Soda. She understands and wishes to proceed. Surgery will be scheduled.

## 2012-07-15 ENCOUNTER — Ambulatory Visit (HOSPITAL_COMMUNITY): Payer: Medicaid Other | Admitting: Anesthesiology

## 2012-07-15 ENCOUNTER — Ambulatory Visit (HOSPITAL_COMMUNITY)
Admission: RE | Admit: 2012-07-15 | Discharge: 2012-07-15 | Disposition: A | Discharge status: Home / Self Care | Payer: Medicaid Other | Source: Ambulatory Visit | Attending: Surgery | Admitting: Surgery

## 2012-07-15 ENCOUNTER — Encounter (HOSPITAL_COMMUNITY): Payer: Self-pay | Admitting: *Deleted

## 2012-07-15 ENCOUNTER — Encounter (HOSPITAL_COMMUNITY)
Admission: RE | Disposition: A | Discharge status: Home / Self Care | Payer: Self-pay | Source: Ambulatory Visit | Attending: Surgery

## 2012-07-15 ENCOUNTER — Encounter (HOSPITAL_COMMUNITY): Payer: Self-pay | Admitting: Anesthesiology

## 2012-07-15 DIAGNOSIS — C50911 Malignant neoplasm of unspecified site of right female breast: Secondary | ICD-10-CM

## 2012-07-15 DIAGNOSIS — F329 Major depressive disorder, single episode, unspecified: Secondary | ICD-10-CM | POA: Insufficient documentation

## 2012-07-15 DIAGNOSIS — C50919 Malignant neoplasm of unspecified site of unspecified female breast: Secondary | ICD-10-CM | POA: Insufficient documentation

## 2012-07-15 DIAGNOSIS — F3289 Other specified depressive episodes: Secondary | ICD-10-CM | POA: Insufficient documentation

## 2012-07-15 DIAGNOSIS — Z79899 Other long term (current) drug therapy: Secondary | ICD-10-CM | POA: Insufficient documentation

## 2012-07-15 DIAGNOSIS — Z170 Estrogen receptor positive status [ER+]: Secondary | ICD-10-CM | POA: Insufficient documentation

## 2012-07-15 DIAGNOSIS — F17200 Nicotine dependence, unspecified, uncomplicated: Secondary | ICD-10-CM | POA: Insufficient documentation

## 2012-07-15 DIAGNOSIS — J4489 Other specified chronic obstructive pulmonary disease: Secondary | ICD-10-CM | POA: Insufficient documentation

## 2012-07-15 DIAGNOSIS — J449 Chronic obstructive pulmonary disease, unspecified: Secondary | ICD-10-CM | POA: Insufficient documentation

## 2012-07-15 DIAGNOSIS — M549 Dorsalgia, unspecified: Secondary | ICD-10-CM | POA: Insufficient documentation

## 2012-07-15 HISTORY — PX: BREAST LUMPECTOMY: SHX2

## 2012-07-15 SURGERY — BREAST LUMPECTOMY WITH EXCISION OF SENTINEL NODE
Anesthesia: General | Site: Chest | Laterality: Right | Wound class: Clean

## 2012-07-15 MED ORDER — FENTANYL CITRATE 0.05 MG/ML IJ SOLN: 50.0000 ug | INTRAMUSCULAR | Status: DC | PRN

## 2012-07-15 MED ORDER — LACTATED RINGERS IV SOLN
INTRAVENOUS | Status: DC | PRN
  Administered 2012-07-15: 09:00:00 via INTRAVENOUS

## 2012-07-15 MED ORDER — METHYLENE BLUE 1 % INJ SOLN
INTRAMUSCULAR | Status: AC
  Filled 2012-07-15: qty 10

## 2012-07-15 MED ORDER — PROPOFOL 10 MG/ML IV BOLUS
INTRAVENOUS | Status: DC | PRN
  Administered 2012-07-15: 20 mg via INTRAVENOUS
  Administered 2012-07-15: 180 mg via INTRAVENOUS

## 2012-07-15 MED ORDER — OXYCODONE HCL 5 MG PO TABS
5.0000 mg | ORAL_TABLET | Freq: Once | ORAL | Status: AC | PRN
  Administered 2012-07-15: 5 mg via ORAL

## 2012-07-15 MED ORDER — SODIUM CHLORIDE 0.9 % IJ SOLN
INTRAMUSCULAR | Status: DC | PRN
  Administered 2012-07-15: 3 mL

## 2012-07-15 MED ORDER — MIDAZOLAM HCL 5 MG/5ML IJ SOLN
INTRAMUSCULAR | Status: DC | PRN
  Administered 2012-07-15 (×2): 1 mg via INTRAVENOUS

## 2012-07-15 MED ORDER — ONDANSETRON HCL 4 MG/2ML IJ SOLN
INTRAMUSCULAR | Status: DC | PRN
  Administered 2012-07-15: 4 mg via INTRAVENOUS

## 2012-07-15 MED ORDER — FENTANYL CITRATE 0.05 MG/ML IJ SOLN
INTRAMUSCULAR | Status: AC
  Filled 2012-07-15: qty 2

## 2012-07-15 MED ORDER — TECHNETIUM TC 99M SULFUR COLLOID FILTERED
1.0000 | Freq: Once | INTRAVENOUS | Status: AC | PRN
  Administered 2012-07-15: 1 via INTRADERMAL

## 2012-07-15 MED ORDER — MUPIROCIN 2 % EX OINT
TOPICAL_OINTMENT | CUTANEOUS | Status: AC
  Administered 2012-07-15: 1 via NASAL
  Filled 2012-07-15: qty 22

## 2012-07-15 MED ORDER — 0.9 % SODIUM CHLORIDE (POUR BTL) OPTIME
TOPICAL | Status: DC | PRN
  Administered 2012-07-15: 1000 mL

## 2012-07-15 MED ORDER — OXYCODONE HCL 5 MG PO TABS
ORAL_TABLET | ORAL | Status: AC
  Filled 2012-07-15: qty 1

## 2012-07-15 MED ORDER — MIDAZOLAM HCL 2 MG/2ML IJ SOLN: 1.0000 mg | INTRAMUSCULAR | Status: DC | PRN

## 2012-07-15 MED ORDER — BUPIVACAINE-EPINEPHRINE PF 0.25-1:200000 % IJ SOLN
INTRAMUSCULAR | Status: AC
  Filled 2012-07-15: qty 30

## 2012-07-15 MED ORDER — BUPIVACAINE-EPINEPHRINE 0.25% -1:200000 IJ SOLN
INTRAMUSCULAR | Status: DC | PRN
  Administered 2012-07-15: 20 mL

## 2012-07-15 MED ORDER — ONDANSETRON HCL 4 MG/2ML IJ SOLN
4.0000 mg | Freq: Four times a day (QID) | INTRAMUSCULAR | Status: AC | PRN
  Administered 2012-07-15: 4 mg via INTRAVENOUS

## 2012-07-15 MED ORDER — ARTIFICIAL TEARS OP OINT
TOPICAL_OINTMENT | OPHTHALMIC | Status: DC | PRN
  Administered 2012-07-15: 1 via OPHTHALMIC

## 2012-07-15 MED ORDER — OXYCODONE HCL 5 MG/5ML PO SOLN: 5.0000 mg | Freq: Once | ORAL | Status: AC | PRN

## 2012-07-15 MED ORDER — FENTANYL CITRATE 0.05 MG/ML IJ SOLN
25.0000 ug | INTRAMUSCULAR | Status: DC | PRN
  Administered 2012-07-15 (×2): 25 ug via INTRAVENOUS
  Administered 2012-07-15: 50 ug via INTRAVENOUS
  Administered 2012-07-15: 25 ug via INTRAVENOUS

## 2012-07-15 MED ORDER — LIDOCAINE HCL (CARDIAC) 20 MG/ML IV SOLN
INTRAVENOUS | Status: DC | PRN
  Administered 2012-07-15: 80 mg via INTRAVENOUS

## 2012-07-15 MED ORDER — METHYLENE BLUE 1 % INJ SOLN
INTRAMUSCULAR | Status: DC | PRN
  Administered 2012-07-15: 2 mL

## 2012-07-15 MED ORDER — HYDROCODONE-ACETAMINOPHEN 5-325 MG PO TABS: 1.0000 | ORAL_TABLET | ORAL | Status: DC | PRN

## 2012-07-15 MED ORDER — FENTANYL CITRATE 0.05 MG/ML IJ SOLN
INTRAMUSCULAR | Status: DC | PRN
  Administered 2012-07-15: 100 ug via INTRAVENOUS
  Administered 2012-07-15 (×3): 50 ug via INTRAVENOUS

## 2012-07-15 MED ORDER — ONDANSETRON HCL 4 MG/2ML IJ SOLN
INTRAMUSCULAR | Status: AC
  Filled 2012-07-15: qty 2

## 2012-07-15 SURGICAL SUPPLY — 49 items
APPLIER CLIP 9.375 MED OPEN (MISCELLANEOUS) ×2
BENZOIN TINCTURE PRP APPL 2/3 (GAUZE/BANDAGES/DRESSINGS) ×2 IMPLANT
BINDER BREAST LRG (GAUZE/BANDAGES/DRESSINGS) IMPLANT
BINDER BREAST XLRG (GAUZE/BANDAGES/DRESSINGS) IMPLANT
BLADE SURG 15 STRL LF DISP TIS (BLADE) ×1 IMPLANT
BLADE SURG 15 STRL SS (BLADE) ×1
CANISTER SUCTION 2500CC (MISCELLANEOUS) ×2 IMPLANT
CHLORAPREP W/TINT 26ML (MISCELLANEOUS) ×2 IMPLANT
CLIP APPLIE 9.375 MED OPEN (MISCELLANEOUS) ×1 IMPLANT
CLOTH BEACON ORANGE TIMEOUT ST (SAFETY) ×2 IMPLANT
CONT SPEC 4OZ CLIKSEAL STRL BL (MISCELLANEOUS) ×4 IMPLANT
COVER PROBE W GEL 5X96 (DRAPES) ×2 IMPLANT
COVER SURGICAL LIGHT HANDLE (MISCELLANEOUS) ×2 IMPLANT
DEVICE DUBIN SPECIMEN MAMMOGRA (MISCELLANEOUS) IMPLANT
DRAPE LAPAROSCOPIC ABDOMINAL (DRAPES) ×2 IMPLANT
ELECT CAUTERY BLADE 6.4 (BLADE) ×2 IMPLANT
ELECT REM PT RETURN 9FT ADLT (ELECTROSURGICAL) ×2
ELECTRODE REM PT RTRN 9FT ADLT (ELECTROSURGICAL) ×1 IMPLANT
GLOVE BIO SURGEON STRL SZ7.5 (GLOVE) ×2 IMPLANT
GLOVE BIOGEL PI IND STRL 7.5 (GLOVE) ×1 IMPLANT
GLOVE BIOGEL PI INDICATOR 7.5 (GLOVE) ×1
GLOVE ECLIPSE 7.0 STRL STRAW (GLOVE) ×2 IMPLANT
GLOVE SURG SIGNA 7.5 PF LTX (GLOVE) ×2 IMPLANT
GOWN STRL NON-REIN LRG LVL3 (GOWN DISPOSABLE) ×2 IMPLANT
GOWN STRL REIN XL XLG (GOWN DISPOSABLE) ×2 IMPLANT
KIT BASIN OR (CUSTOM PROCEDURE TRAY) ×2 IMPLANT
KIT MARKER MARGIN INK (KITS) ×2 IMPLANT
KIT ROOM TURNOVER OR (KITS) ×2 IMPLANT
NEEDLE 18GX1X1/2 (RX/OR ONLY) (NEEDLE) ×2 IMPLANT
NEEDLE HYPO 25GX1X1/2 BEV (NEEDLE) ×4 IMPLANT
NS IRRIG 1000ML POUR BTL (IV SOLUTION) ×2 IMPLANT
PACK SURGICAL SETUP 50X90 (CUSTOM PROCEDURE TRAY) ×2 IMPLANT
PAD ARMBOARD 7.5X6 YLW CONV (MISCELLANEOUS) ×2 IMPLANT
PENCIL BUTTON HOLSTER BLD 10FT (ELECTRODE) ×2 IMPLANT
SPONGE GAUZE 4X4 12PLY (GAUZE/BANDAGES/DRESSINGS) ×2 IMPLANT
SPONGE LAP 18X18 X RAY DECT (DISPOSABLE) ×2 IMPLANT
SPONGE LAP 4X18 X RAY DECT (DISPOSABLE) ×2 IMPLANT
STRIP CLOSURE SKIN 1/2X4 (GAUZE/BANDAGES/DRESSINGS) ×2 IMPLANT
SUT MON AB 4-0 PC3 18 (SUTURE) ×4 IMPLANT
SUT SILK 2 0 SH (SUTURE) ×2 IMPLANT
SUT VIC AB 3-0 SH 27 (SUTURE) ×1
SUT VIC AB 3-0 SH 27XBRD (SUTURE) ×1 IMPLANT
SYR BULB 3OZ (MISCELLANEOUS) ×2 IMPLANT
SYR CONTROL 10ML LL (SYRINGE) ×4 IMPLANT
TOWEL OR 17X24 6PK STRL BLUE (TOWEL DISPOSABLE) ×2 IMPLANT
TOWEL OR 17X26 10 PK STRL BLUE (TOWEL DISPOSABLE) ×2 IMPLANT
TUBE CONNECTING 12X1/4 (SUCTIONS) ×2 IMPLANT
WATER STERILE IRR 1000ML POUR (IV SOLUTION) IMPLANT
YANKAUER SUCT BULB TIP NO VENT (SUCTIONS) ×2 IMPLANT

## 2012-07-15 NOTE — Op Note (Signed)
BREAST LUMPECTOMY WITH AXILLARY LYMPH NODE BIOPSY  Procedure Note  Tracy Neal 07/15/2012   Pre-op Diagnosis: right breast cancer      Post-op Diagnosis: same  Procedure(s): RIGHT BREAST PARTIAL MASTECTOMY  WITH AXILLARY LYMPH NODE BIOPSY INJECTION OF BLUE DYE  Surgeon(s): Shelly Rubenstein, MD  Anesthesia: General  Staff:  Circulator: Wilhelmenia Blase, RN Relief Circulator: Tanda Rockers, RN Scrub Person: Maureen Ralphs, RN  Estimated Blood Loss: Minimal               Specimens: breast and nodes x 2          Tracy Neal   Date: 07/15/2012  Time: 10:03 AM

## 2012-07-15 NOTE — Transfer of Care (Signed)
Immediate Anesthesia Transfer of Care Note  Patient: Tracy Neal  Procedure(s) Performed: Procedure(s) with comments: BREAST LUMPECTOMY WITH AXILLARY LYMPH NODE BIOPSY (Right) - Nuclear medicine injection 9:30   Patient Location: PACU  Anesthesia Type:General  Level of Consciousness: awake, alert , oriented and sedated  Airway & Oxygen Therapy: Patient Spontanous Breathing and Patient connected to nasal cannula oxygen  Post-op Assessment: Report given to PACU RN, Post -op Vital signs reviewed and stable and Patient moving all extremities  Post vital signs: Reviewed and stable  Complications: No apparent anesthesia complications

## 2012-07-15 NOTE — Interval H&P Note (Signed)
History and Physical Interval Note: no change in H and P  07/15/2012 7:23 AM  Tracy Neal  has presented today for surgery, with the diagnosis of right breast cancer   The various methods of treatment have been discussed with the patient and family. After consideration of risks, benefits and other options for treatment, the patient has consented to  Procedure(s) with comments: BREAST LUMPECTOMY WITH AXILLARY LYMPH NODE BIOPSY (Right) - Nuclear medicine injection 9:30  as a surgical intervention .  The patient's history has been reviewed, patient examined, no change in status, stable for surgery.  I have reviewed the patient's chart and labs.  Questions were answered to the patient's satisfaction.     [name]

## 2012-07-15 NOTE — Op Note (Signed)
[name], [name]                ACCOUNT NO.:  [id number]  MEDICAL RECORD NO.:  [id number]  LOCATION:  [facility]                          FACILITY:  [facility]  PHYSICIAN:  Abigail Miyamoto, M.D. DATE OF BIRTH:  09-Aug-1955  DATE OF PROCEDURE:  07/15/2012 DATE OF DISCHARGE:                              OPERATIVE REPORT   PREOPERATIVE DIAGNOSIS:  Right breast cancer.  POSTOPERATIVE DIAGNOSIS:  Right breast cancer.  PROCEDURES: 1. Right breast partial mastectomy with right axillary sentinel lymph     node biopsy. 2. Injection of blue dye.  SURGEON:  Abigail Miyamoto, M.D.  ANESTHESIA:  General and 0.5% Marcaine.  ESTIMATED BLOOD LOSS:  Minimal.  INDICATIONS:  This is a 57 year old female who was found to have a spiculated mass in the right breast at 12 o'clock position.  Biopsy confirmed invasive cancer.  Decision was made to proceed with partial mastectomy and sentinel node biopsy.  FINDINGS:  The patient was found to have two sentinel lymph nodes identified in the right axilla.  PROCEDURE IN DETAIL:  The patient was brought to the operating room and identified as Tracy Neal.  She was placed supine on the operating room table and general anesthesia was induced.  Her right breast and axilla were then prepped and draped in usual sterile fashion.  The mass was located at 12 o'clock position of the breast.  I made a transverse incision on the breast at the 12 o'clock position with the scalpel.  I took this down to the breast tissue with electrocautery.  I then did a wide partial mastectomy incorporating all the tissue at the 12 o'clock area going all way down to the chest wall.  Wide margins around the palpable mass appeared to be achieved.  Once the mass was completely excised, I marked all quadrants with marker pen and sent the mass to Pathology for evaluation.  Hemostasis was then achieved with cautery. The Neoprobe was then brought to the field and I identified an area  of increased uptake in the axilla.  After induction of general anesthesia, I had injected blue dye underneath the areola and had massaged the breast thoroughly.  I made an incision in the axilla after anesthetizing with the lidocaine and took this down to the breast tissue with electrocautery.  With the use of the blue dye as well as the Neoprobe, I was able to identify two lymph nodes in the axilla, one had uptake of blue dye and the other did not, but both had uptake of radioisotope. Once these nodes were removed, the nodal basin showed no further increased uptake and there were no palpable lymph nodes.  I then anesthetized the wound further with Marcaine.  I placed surgical clips in the biopsy site on the chest wall.  I then irrigated the wound with saline.  Subcutaneous tissue was then closed with interrupted 3-0 Vicryl sutures and skin was closed with running 4-0 Monocryl.  Steri-Strips, gauze, and Tegaderm were then applied.  The patient tolerated the procedure well.  All the counts were correct at the end of the procedure.  The patient was then extubated in the operating room and taken in stable condition to recovery room.  Abigail Miyamoto, M.D.     DB/MEDQ  D:  07/15/2012  T:  07/15/2012  Job:  [id number]

## 2012-07-15 NOTE — Anesthesia Postprocedure Evaluation (Signed)
Anesthesia Post Note  Patient: Tracy Neal  Procedure(s) Performed: Procedure(s) (LRB): BREAST LUMPECTOMY WITH AXILLARY LYMPH NODE BIOPSY (Right)  Anesthesia type: General  Patient location: PACU  Post pain: Pain level controlled and Adequate analgesia  Post assessment: Post-op Vital signs reviewed, Patient's Cardiovascular Status Stable, Respiratory Function Stable, Patent Airway and Pain level controlled  Last Vitals:  Filed Vitals:   07/15/12 1048  BP: 116/67  Pulse: 71  Temp:   Resp: 18    Post vital signs: Reviewed and stable  Level of consciousness: awake, alert  and oriented  Complications: No apparent anesthesia complications

## 2012-07-15 NOTE — Preoperative (Signed)
Beta Blockers   Reason not to administer Beta Blockers:Not Applicable 

## 2012-07-15 NOTE — Anesthesia Preprocedure Evaluation (Signed)
Anesthesia Evaluation  Patient identified by MRN, date of birth, ID band Patient awake    Reviewed: Allergy & Precautions, H&P , NPO status , Patient's Chart, lab work & pertinent test results  Airway Mallampati: II  Neck ROM: full    Dental   Pulmonary COPDCurrent Smoker,          Cardiovascular     Neuro/Psych Depression    GI/Hepatic   Endo/Other  Morbid obesity  Renal/GU      Musculoskeletal   Abdominal   Peds  Hematology   Anesthesia Other Findings   Reproductive/Obstetrics                           Anesthesia Physical Anesthesia Plan  ASA: III  Anesthesia Plan: General   Post-op Pain Management:    Induction: Intravenous  Airway Management Planned: LMA  Additional Equipment:   Intra-op Plan:   Post-operative Plan:   Informed Consent: I have reviewed the patients History and Physical, chart, labs and discussed the procedure including the risks, benefits and alternatives for the proposed anesthesia with the patient or authorized representative who has indicated his/her understanding and acceptance.     Plan Discussed with: CRNA, Anesthesiologist and Surgeon  Anesthesia Plan Comments:         Anesthesia Quick Evaluation

## 2012-07-16 ENCOUNTER — Telehealth (INDEPENDENT_AMBULATORY_CARE_PROVIDER_SITE_OTHER): Payer: Self-pay | Admitting: General Surgery

## 2012-07-16 ENCOUNTER — Encounter: Payer: Self-pay | Admitting: Oncology

## 2012-07-16 NOTE — Telephone Encounter (Signed)
Patient is aware of po f/u  appt on 07/23/12 at  950am

## 2012-07-16 NOTE — Progress Notes (Signed)
Patient left a message about medicaid status. I called and left her a message to contact Sabrina and see if she can give her a status update.

## 2012-07-17 ENCOUNTER — Encounter (HOSPITAL_COMMUNITY): Payer: Self-pay | Admitting: Surgery

## 2012-07-17 ENCOUNTER — Telehealth (HOSPITAL_COMMUNITY): Payer: Self-pay | Admitting: *Deleted

## 2012-07-17 NOTE — Telephone Encounter (Signed)
Telephoned patient at home #. Spoke with patient. Patient's medicaid has been approved. Gave patient number for [employer] transportation [phone] to assist with transportation. [facility] will not travel to [facility]. Patient will call today to set up for July 8 and July 15 appointments.

## 2012-07-23 ENCOUNTER — Encounter (INDEPENDENT_AMBULATORY_CARE_PROVIDER_SITE_OTHER): Payer: Self-pay | Admitting: Surgery

## 2012-07-23 ENCOUNTER — Encounter: Payer: Self-pay | Admitting: *Deleted

## 2012-07-23 NOTE — Progress Notes (Signed)
Oncotype Dx order sent to pathology.  Received confirmation from Tammy fax was received.

## 2012-07-29 ENCOUNTER — Telehealth: Payer: Self-pay | Admitting: *Deleted

## 2012-07-29 ENCOUNTER — Ambulatory Visit (HOSPITAL_BASED_OUTPATIENT_CLINIC_OR_DEPARTMENT_OTHER): Payer: Medicaid Other | Admitting: Oncology

## 2012-07-29 VITALS — BP 119/84 | HR 76 | Temp 98.2°F | Resp 20 | Ht 67.0 in | Wt 243.9 lb

## 2012-07-29 DIAGNOSIS — C50111 Malignant neoplasm of central portion of right female breast: Secondary | ICD-10-CM

## 2012-07-29 DIAGNOSIS — C50911 Malignant neoplasm of unspecified site of right female breast: Secondary | ICD-10-CM

## 2012-07-29 DIAGNOSIS — C50119 Malignant neoplasm of central portion of unspecified female breast: Secondary | ICD-10-CM

## 2012-07-29 DIAGNOSIS — N63 Unspecified lump in unspecified breast: Secondary | ICD-10-CM

## 2012-07-29 DIAGNOSIS — C50919 Malignant neoplasm of unspecified site of unspecified female breast: Secondary | ICD-10-CM

## 2012-07-29 MED ORDER — CEPHALEXIN 500 MG PO CAPS: 500.0000 mg | ORAL_CAPSULE | Freq: Three times a day (TID) | ORAL | Status: DC

## 2012-07-29 MED ORDER — HYDROCODONE-ACETAMINOPHEN 5-325 MG PO TABS: 1.0000 | ORAL_TABLET | ORAL | Status: DC | PRN

## 2012-07-29 NOTE — Progress Notes (Signed)
ID: Tracy Neal OB: 03-01-1955  MR#: [id number]  CSN#:[id number]  PCP: MUSE,ROCHELLE D., PA-C GYN:   SU: Abigail Miyamoto OTHER MD: Chipper Herb, Baird Lyons   HISTORY OF PRESENT ILLNESS: "Tracy Neal" tells me she herself found a lump in her right breast December of 2013. She brought it to the attention of the [facility] health department and they set her up for mammography at the [facility] 06/05/2012. This found her breasts to be almost entirely fat replaced. There was a 2.8 cm spiculated mass in the middle third of the right breast, with no microcalcifications. This mass was palpable at 12:00 6 cm from the nipple. Ultrasound confirmed a 2.7 cm irregular mass at the area in question. Otherwise ultrasound of the right axilla was benign.  Biopsy of the right breast mass 06/11/2012 ([id number]) showed an invasive ductal carcinoma, grade 2, estrogen receptor 90% positive, progesterone receptor negative, HER-2 not amplified, with an MIB-1 of 15%. The patient was set up for breast MRI, but was unable to tolerate the procedure because of claustrophobia. Her subsequent history is as detailed below.  INTERVAL HISTORY: Tracy Neal returns for followup of her breast cancer. Since her last visit here she underwent right lumpectomy and sentinel lymph node sampling (07/15/2012). Margins were negative though close. Oncotype has been requested.  REVIEW OF SYSTEMS: Her right breast feels heavy and swollen. She denies fever or bleeding problems, but she still having pain, all of that is slightly improved. She describes herself is moderately fatigued. She has had no unusual headaches, visual changes, cough, phlegm production, or pleurisy. There've been no change in bowel or bladder habits. A detailed review of systems today was otherwise noncontributory  PAST MEDICAL HISTORY: Past Medical History  Diagnosis Date   COPD (chronic obstructive pulmonary disease)    Breast cancer    Depression    Umbilical hernia     COPD, moderate    Basal cell carcinoma     not biopsy confirmed; Right bridge of nose    PAST SURGICAL HISTORY: Past Surgical History  Procedure Laterality Date   Cholecystectomy     Tubal ligation     Breast lumpectomy Right 07/15/2012    Procedure: BREAST LUMPECTOMY WITH AXILLARY LYMPH NODE BIOPSY;  Surgeon: Shelly Rubenstein, MD;  Location: [facility] OR;  Service: General;  Laterality: Right;  Nuclear medicine injection 9:30     FAMILY HISTORY Family History  Problem Relation Age of Onset   Diabetes Mother    the patient's father committed suicide at the age of 54. The patient's mother died when the patient was age 2. The patient was adopted by her uncle, who died at age 110. Her adopted mother died at age 44 with MS. The patient has one full sister, 2 half-sisters and 4 half-brothers. No one in the family has a history of cancer to the patient's knowledge  GYNECOLOGIC HISTORY:  Menarche age 43, first live birth age 42. She is GX P2 She stopped having periods about 1988. She never took hormone replacement. She never took birth control pills.  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 She is divorced. She lives alone, with no pets in an [place] "project." Her son Tracy Moynahan. Neal lives in [place] and works in Aeronautical engineer. The patient's other her son was put up for adoption by the patient's former husband (they had each been granted custody of  one child). She tells me that he is in touch with him, his name is Tracy Neal, and he lives in [place], where he works at detailing cars. The patient has 3 granddaughters. She attends a [employer]    ADVANCED DIRECTIVES: Not in place. The patient was not able to give me a clear answer about whom she would like to make medical decisions for her if that became necessary.   HEALTH  MAINTENANCE: History  Substance Use Topics   Smoking status: Current Every Day Smoker -- 1.00 packs/day for 40 years    Types: Cigarettes   Smokeless tobacco: Never Used   Alcohol Use: No     Colonoscopy: Never  PAP: April 2014  Bone density: Never  Lipid panel:  No Known Allergies  Current Outpatient Prescriptions  Medication Sig Dispense Refill   acetaminophen (TYLENOL) 500 MG tablet Take 500 mg by mouth every 6 (six) hours as needed for pain.       albuterol-ipratropium (COMBIVENT) 18-103 MCG/ACT inhaler Inhale 2 puffs into the lungs every 6 (six) hours as needed for wheezing.       famotidine-calcium carbonate-magnesium hydroxide (PEPCID COMPLETE) 10-800-165 MG CHEW Chew 1 tablet by mouth 2 (two) times daily as needed. For heartburn       HYDROcodone-acetaminophen (NORCO) 5-325 MG per tablet Take 1-2 tablets by mouth every 4 (four) hours as needed for pain.  30 tablet  1   ibuprofen (ADVIL,MOTRIN) 200 MG tablet Take 200 mg by mouth every 6 (six) hours as needed for pain.       No current facility-administered medications for this visit.    OBJECTIVE: Middle-aged white woman in no acute distress Filed Vitals:   07/29/12 1439  BP: 119/84  Pulse: 76  Temp: 98.2 F (36.8 C)  Resp: 20     Body mass index is 38.19 kg/(m^2).    ECOG FS: 2  Sclerae unicteric Oropharynx clear No cervical or supraclavicular adenopathy Lungs no rales or rhonchi, fair excursion bilaterally Heart regular rate and rhythm, no murmur appreciated Abd obese, obvious umbilical hernia, no bowel sounds in hernia, otherwise no tenderness or other masses palpated MSK no focal spinal tenderness Neuro: non-focal, well-oriented, friendly affect Breasts: The right breast is status post lumpectomy. It is minimally erythematous but does appear swollen as compared to the left breast. There is no nipple change and the right axilla is benign. The left breast is unremarkable   LAB RESULTS:  CMP      Component Value Date/Time   NA 138 07/09/2012 1612   NA 140 06/19/2012 0830   K 4.6 07/09/2012 1612   K 3.8 06/19/2012 0830   CL 101 07/09/2012 1612   CL 106 06/19/2012 0830   CO2 28 07/09/2012 1612   CO2 26 06/19/2012 0830   GLUCOSE 88 07/09/2012 1612   GLUCOSE 88 06/19/2012 0830   BUN 13 07/09/2012 1612   BUN 13.9 06/19/2012 0830   CREATININE 1.01 07/09/2012 1612   CREATININE 0.8 06/19/2012 0830   CALCIUM 9.6 07/09/2012 1612   CALCIUM 9.4 06/19/2012 0830   PROT 7.1 06/19/2012 0830   ALBUMIN 3.5 06/19/2012 0830   AST 17 06/19/2012 0830   ALT 17 06/19/2012 0830   ALKPHOS 84 06/19/2012 0830   BILITOT 0.35 06/19/2012 0830   GFRNONAA 60* 07/09/2012 1612   GFRAA 70* 07/09/2012 1612    I No results found for this basename: SPEP,  UPEP,   kappa and lambda light chains    Lab  Results  Component Value Date   WBC 14.4* 07/09/2012   NEUTROABS 5.6 06/19/2012   HGB 16.5* 07/09/2012   HCT 48.1* 07/09/2012   MCV 88.1 07/09/2012   PLT 302 07/09/2012      Chemistry      Component Value Date/Time   NA 138 07/09/2012 1612   NA 140 06/19/2012 0830   K 4.6 07/09/2012 1612   K 3.8 06/19/2012 0830   CL 101 07/09/2012 1612   CL 106 06/19/2012 0830   CO2 28 07/09/2012 1612   CO2 26 06/19/2012 0830   BUN 13 07/09/2012 1612   BUN 13.9 06/19/2012 0830   CREATININE 1.01 07/09/2012 1612   CREATININE 0.8 06/19/2012 0830      Component Value Date/Time   CALCIUM 9.6 07/09/2012 1612   CALCIUM 9.4 06/19/2012 0830   ALKPHOS 84 06/19/2012 0830   AST 17 06/19/2012 0830   ALT 17 06/19/2012 0830   BILITOT 0.35 06/19/2012 0830       No results found for this basename: LABCA2    No components found with this basename: LABCA125    No results found for this basename: INR,  in the last 168 hours  Urinalysis No results found for this basename: colorurine,  appearanceur,  labspec,  phurine,  glucoseu,  hgbur,  bilirubinur,  ketonesur,  proteinur,  urobilinogen,  nitrite,  leukocytesur    STUDIES: Dg Chest 2 View  07/09/2012   *RADIOLOGY REPORT*   Clinical Data: Preoperative evaluation for right breast surgery. History of COPD.  Smoking history one pack per day  CHEST - 2 VIEW  Comparison: 01/01/2011  Findings: Heart and mediastinal contours are within normal limits. The lung fields appear clear with no signs of focal infiltrate or congestive failure.  No pleural fluid or significant peribronchial cuffing is identified.  Bony structures demonstrate mild degenerative change of the mid thoracic spine and are otherwise intact.  IMPRESSION: Unremarkable cardiopulmonary appearance with no new focal or acute abnormality seen.   Original Report Authenticated By: Rhodia Albright, M.D.   Nm Sentinel Node Inj-no Rpt (breast)  07/15/2012   CLINICAL DATA: right breast cancer   Sulfur colloid was injected intradermally by the nuclear medicine  technologist for breast cancer sentinel node localization.   Patient: REE, ALCALDE Collected: 07/15/2012 Client:[facility] Accession: [id number] Received: 07/15/2012 Abigail Miyamoto, MD    DOB: March 21, 1955 Age: 29 Gender: F Reported: 07/17/2012 [address] Patient Ph: [phone] MRN #: [id number] [place], [place] [address] Visit #: [id number] Chart #: Phone:  Fax: CC: Marianne Sofia, RN Baird Lyons, MD  REPORT OF SURGICAL PATHOLOGY ADDITIONAL INFORMATION: 1. CHROMOGENIC IN-SITU HYBRIDIZATION Results: HER-2/NEU BY CISH - NO AMPLIFICATION OF HER-2 DETECTED. RESULT RATIO OF HER2: CEP 17 SIGNALS 1.03 AVERAGE HER2 COPY NUMBER PER CELL 1.50 REFERENCE RANGE NEGATIVE HER2/Chr17 Ratio <2.0 and Average HER2 copy number <4.0 EQUIVOCAL HER2/Chr17 Ratio <2.0 and Average HER2 copy number 4.0 and <6.0 POSITIVE HER2/Chr17 Ratio >=2.0 and/or Average HER2 copy number >=6.0 Pecola Leisure MD Pathologist, Electronic Signature ( Signed 07/18/2012) 1. E cadherin immunohistochemistry is performed and shows a diffuse, strong positivity consistent with ductal differentiation. (JDP:caf 07/17/12) This is NOT signed  out FINAL DIAGNOSIS Diagnosis 1. Breast, lumpectomy, Right - INVASIVE DUCTAL CARCINOMA, 3.2 CM. - MARGINS NOT INVOLVED. 1 of 4 Duplicate copy FINAL for Rapaport, Jeaninne [phone]) Diagnosis(continued) - CLOSEST MARGIN SUPERIOR AT 0.1 CM. 2. Lymph node, sentinel, biopsy, Right axilla #1 - ONE BENIGN LYMPH NODE (0/1). 3. Lymph node, sentinel, biopsy, Right  axilla #2 - ONE BENIGN LYMPH NODE (0/1). Microscopic Comment 1. BREAST, INVASIVE TUMOR, WITH LYMPH NODE SAMPLING Specimen, including laterality: Right breast and sentinel lymph nodes Procedure: Lumpectomy and sentinel lymph node excision Grade: II Tubule formation: III Nuclear pleomorphism: II Mitotic: I Tumor size (gross measurement): 3.2 cm Margins: Free of tumor Invasive, distance to closest margin: 0.1 cm from superior In-situ, distance to closest margin: N/A If margin positive, focally or broadly: N/A Lymphovascular invasion: Not identified Ductal carcinoma in situ: No Grade: N/A Extensive intraductal component: N/A Lobular neoplasia: No Tumor focality: Unifocal Treatment effect: No If present, treatment effect in breast tissue, lymph nodes or both: N/A Extent of tumor: Skin: N/A Nipple: N/A Skeletal muscle: N/A Lymph nodes: # examined: 2 Lymph nodes with metastasis: 0 Isolated tumor cells (< 0.2 mm): 0 Micrometastasis: (> 0.2 mm and < 2.0 mm): 0 Macrometastasis: (> 2.0 mm): 0 Extracapsular extension: N/A Breast prognostic profile: [id number] Estrogen receptor: 100%, strong staining Progesterone receptor: 0% Her 2 neu: No amplification, ratio 0.88. Will be repeated on current specimen Ki-67: 18% Non-neoplastic breast: Fibrocystic changes TNM: pT2, pN0, pMX (JDP:caf 07/16/12) Jimmy Picket MD Pathologist, Electronic Signature (Case signed 07/17/2012) 2 of ASSESSMENT: 57 y.o. [place], [place] woman status post right breast lumpectomy and sentinel lymph node sampling 07/15/2012 for a pT2 pN0, stage IIA invasive ductal  carcinoma, grade 2, estrogen receptor 90% positive, progesterone receptor negative, HER-2 not amplified, with an MIB-1 of 15%  (1) Oncotype DX pending  PLAN: Today we discussed at length the issue whether Ralyn should receive chemotherapy. The adjuvant! Program would quote her a risk of relapse with local treatment only of 44%. This can be reduced with optimal hormonal therapy by 21%, leaving her with a residual risk of 22%. Chemotherapy would lower this risk an additional 7%. Our standard recommendation is to proceed with chemotherapy for this degree of benefit, but we did discuss the possibility of obtaining an Oncotype, though my feeling is this is likely to put her in the intermediate risk group. Gavin Pound was interested in obtaining this test, so it is being ordered and I have scheduled her to meet with me again July 30 to discuss those results.  Since most likely she will need chemotherapy and certainly afterwards will need radiation treatments, it would be much easier for her to receive these treatments in [place], since the cancer center is within walking distance of her home. Accordingly I have made her an appointment there for 08/19/2012 at noon. In the meantime I gave her an additional 30 Percocet to take care of her breast pain and explained that it is safe for her to take Advil or Aleve for the pain as needed.  She knows to call for any problems that may develop before her next visit here    Lowella Dell, MD   07/29/2012 2:51 PM

## 2012-07-29 NOTE — Telephone Encounter (Signed)
appts made and printed...td 

## 2012-07-30 ENCOUNTER — Encounter: Payer: Self-pay | Admitting: *Deleted

## 2012-07-30 NOTE — Progress Notes (Signed)
Mailed after appt letter to pt. 

## 2012-08-01 ENCOUNTER — Encounter: Payer: Self-pay | Admitting: *Deleted

## 2012-08-01 NOTE — Progress Notes (Signed)
Received Oncotype Dx results of 28.  Emailed MD w/ Results and placed a copy in his box.  Took a copy to Med Rec to scan.

## 2012-08-06 ENCOUNTER — Telehealth (INDEPENDENT_AMBULATORY_CARE_PROVIDER_SITE_OTHER): Payer: Self-pay

## 2012-08-06 ENCOUNTER — Other Ambulatory Visit: Payer: Self-pay | Admitting: Oncology

## 2012-08-06 ENCOUNTER — Telehealth (INDEPENDENT_AMBULATORY_CARE_PROVIDER_SITE_OTHER): Payer: Self-pay | Admitting: *Deleted

## 2012-08-06 NOTE — Telephone Encounter (Signed)
Error

## 2012-08-06 NOTE — Telephone Encounter (Signed)
Patient called back and unable to find transportation for tomorrow but Alben Spittle can bring patient on Thursday so patient's appt changed to Thursday.

## 2012-08-06 NOTE — Telephone Encounter (Signed)
Patient calls back in today to state that she has not gotten a response from Dr. Darrall Dears office.  Patient is concerned due to continued swelling in her right breast along with redness, warmth, and itching at her incision site.  Patient had a right lumpectomy performed on 07/15/12 and is due to come into see Dr. Magnus Ivan for her 1st PO 08/12/12.  Patient has finished the Keflex prescription and Norco prescription that she was given by Dr. Darnelle Catalan for these same symptoms on 07/29/12.  Patient states she lives in [place] and has no transportation to come into urgent office, she states she uses [employer] transportation and they require a 48 hour notice.  Dr. Magnus Ivan paged at this time to get advice as to what to do.  Dr. Magnus Ivan just called back and stated to reorder the Keflex 500mg  for 7 days and for Norco #30.  Dr. Magnus Ivan also wants patient to be seen in urgent office this week.  Appt made for tomorrow 08/07/12 @ 315p.  Patient is trying to find a ride and will call back if she is unable too.  Prescriptions: Keflex 500mg  TID #21 for 7 days no refill and Norco 5/325mg  1 tablet every 4-6 hours as needed for pain #30 no refill called into [facility] [phone].

## 2012-08-06 NOTE — Telephone Encounter (Signed)
Pt called stating she is almost out of her antibiotic and pain medication that Dr Darnelle Catalan placed her on. Pt wanted to know if Dr Darnelle Catalan is who she should call for refills or our office. Pt advised since she missed appt here and Dr Darnelle Catalan has seen wound and placed her on these meds she should call his office. Pt encouraged to make and deep appt with Dr Magnus Ivan. Pt states she will keep appt for 08-12-12 with Dr Magnus Ivan and call Dr Darnelle Catalan re: refill of pain med.

## 2012-08-07 ENCOUNTER — Encounter (INDEPENDENT_AMBULATORY_CARE_PROVIDER_SITE_OTHER): Payer: Medicaid Other | Admitting: General Surgery

## 2012-08-08 ENCOUNTER — Ambulatory Visit (INDEPENDENT_AMBULATORY_CARE_PROVIDER_SITE_OTHER): Payer: Medicaid Other | Admitting: General Surgery

## 2012-08-08 ENCOUNTER — Encounter (INDEPENDENT_AMBULATORY_CARE_PROVIDER_SITE_OTHER): Payer: Self-pay | Admitting: General Surgery

## 2012-08-08 VITALS — BP 120/80 | HR 66 | Temp 97.0°F | Resp 18 | Ht 67.5 in | Wt 242.0 lb

## 2012-08-08 DIAGNOSIS — L539 Erythematous condition, unspecified: Secondary | ICD-10-CM

## 2012-08-08 DIAGNOSIS — L538 Other specified erythematous conditions: Secondary | ICD-10-CM

## 2012-08-08 NOTE — Patient Instructions (Signed)
Keep your scheduled apt with Dr Magnus Ivan on Mon.

## 2012-08-08 NOTE — Progress Notes (Signed)
Chief Complaint  Patient presents with  . Wound Check    HISTORY:  Tracy Neal is a 57 y.o. female who presents to office approximately 1 mo after lumpectomy.   For the past couple weeks she has had some erythema and swelling of the right breast. She was placed on Keflex. She states that it has gotten somewhat better but has not completely resolved. Dr. Rayburn Ma has renewed her Keflex prescription. She states that her symptoms today has been relatively unchanged for the past week. She denies any purulence from her incision. She has some occasional sharp pain just superior to the incision that occurs when she bends over.    Past Medical History  Diagnosis Date  . COPD (chronic obstructive pulmonary disease)   . Breast cancer   . Depression   . Umbilical hernia   . COPD, moderate   . Basal cell carcinoma     not biopsy confirmed; Right bridge of nose       Past Surgical History  Procedure Laterality Date  . Cholecystectomy    . Tubal ligation    . Breast lumpectomy Right 07/15/2012    Procedure: BREAST LUMPECTOMY WITH AXILLARY LYMPH NODE BIOPSY;  Surgeon: Shelly Rubenstein, MD;  Location: [facility] OR;  Service: General;  Laterality: Right;  Nuclear medicine injection 9:30       Current Outpatient Prescriptions  Medication Sig Dispense Refill  . acetaminophen (TYLENOL) 500 MG tablet Take 500 mg by mouth every 6 (six) hours as needed for pain.      Marland Kitchen albuterol-ipratropium (COMBIVENT) 18-103 MCG/ACT inhaler Inhale 2 puffs into the lungs every 6 (six) hours as needed for wheezing.      . cephALEXin (KEFLEX) 500 MG capsule Take 1 capsule (500 mg total) by mouth 3 (three) times daily.  21 capsule  0  . famotidine-calcium carbonate-magnesium hydroxide (PEPCID COMPLETE) 10-800-165 MG CHEW Chew 1 tablet by mouth 2 (two) times daily as needed. For heartburn      . HYDROcodone-acetaminophen (NORCO/VICODIN) 5-325 MG per tablet TAKE ONE TABLET BY MOUTH EVERY 4 HOURS AS NEEDED FOR PAIN  30 tablet  0  .  ibuprofen (ADVIL,MOTRIN) 200 MG tablet Take 200 mg by mouth every 6 (six) hours as needed for pain.       No current facility-administered medications for this visit.     No Known Allergies    Family History  Problem Relation Age of Onset  . Diabetes Mother       History   Social History  . Marital Status: Single    Spouse Name: N/A    Number of Children: N/A  . Years of Education: N/A   Social History Main Topics  . Smoking status: Current Every Day Smoker -- 1.00 packs/day for 40 years    Types: Cigarettes  . Smokeless tobacco: Never Used  . Alcohol Use: No  . Drug Use: No  . Sexually Active: Yes    Birth Control/ Protection: None   Other Topics Concern  . None   Social History Narrative  . None       REVIEW OF SYSTEMS - PERTINENT POSITIVES ONLY: Review of Systems - General ROS: negative for - chills or fever Breast ROS: positive for - swelling, erythema negative for - nipple discharge or incisional discharge Respiratory ROS: no cough, shortness of breath, or wheezing Cardiovascular ROS: no chest pain or dyspnea on exertion  EXAM: Filed Vitals:   08/08/12 1511  BP: 120/80  Pulse: 66  Temp: 97  F (36.1 C)  Resp: 18    General appearance: alert and cooperative Resp: clear to auscultation bilaterally Cardio: regular rate and rhythm GI: soft, non-tender; bowel sounds normal; no masses,  no organomegaly R breast with erythema, no fluctuant masses, resection cavity palpated.     ASSESSMENT AND PLAN: Tracy Neal is a 57 year old female who is approximately 4 weeks status post a right breast lumpectomy.  On exam her right breast is erythematous. I can palpate no fluctuant masses except for the lumpectomy cavity. It is unclear to me whether this is cellulitis or infection of a seroma. She has an appointment to see Dr. Magnus Ivan on Monday. I've asked her to continue her Keflex for now since things are not getting any worse. Then she and Dr. Magnus Ivan can decide  the next course of action.   [name], MD Colon and Rectal Surgery / General Surgery [facility]      Visit Diagnoses: 1. Erythema of breast     Primary Care Physician: MUSE,ROCHELLE D., PA-C

## 2012-08-12 ENCOUNTER — Ambulatory Visit (INDEPENDENT_AMBULATORY_CARE_PROVIDER_SITE_OTHER): Payer: Medicaid Other | Admitting: Surgery

## 2012-08-12 ENCOUNTER — Encounter (INDEPENDENT_AMBULATORY_CARE_PROVIDER_SITE_OTHER): Payer: Self-pay | Admitting: Surgery

## 2012-08-12 VITALS — BP 138/88 | HR 80 | Temp 98.2°F | Resp 16 | Ht 67.0 in | Wt 246.8 lb

## 2012-08-12 DIAGNOSIS — Z09 Encounter for follow-up examination after completed treatment for conditions other than malignant neoplasm: Secondary | ICD-10-CM

## 2012-08-12 NOTE — Progress Notes (Signed)
Subjective:     Patient ID: Tracy Neal, female   DOB: [birth year], 57 y.o.   MRN: [id number]  HPI She is here for another postop visit. She is still having erythema of the breast despite the antibiotics. She also has some mild discomfort. Her surgery was 4 weeks ago  Review of Systems     Objective:   Physical Exam On exam, there is still erythema mostly around the areola. There is no large seroma. The axillary incision is well-healed    Assessment:      patient stable postop     Plan:     I believe this is still erythema from the injections of the radioisotope  From the sentinel lymph node biopsy. Hopefully this will resolve. I will see her back in 3 weeks to reevaluate the area. I did give her Percocet for her discomfort

## 2012-08-13 ENCOUNTER — Telehealth (INDEPENDENT_AMBULATORY_CARE_PROVIDER_SITE_OTHER): Payer: Self-pay | Admitting: *Deleted

## 2012-08-13 NOTE — Telephone Encounter (Signed)
Patient called to state that she tried to Percocet she received yesterday at office visit last night and it caused her to break out in a sweat, feel like things were crawling on her, and GI upset.  Patient states she can not take the Percocet any more.  Patient states she only has 4 tablets of the Norco left.  Patient asking what she should do and states if she goes back to the Norco she needs a refill.  Explained to patient that a message will be sent to Dr. Magnus Ivan and we will let her know as soon as we have a plan.  Patient states understanding and agreeable at this time.

## 2012-08-13 NOTE — Telephone Encounter (Signed)
Yes, we can renew the Norco.  Actually, call in 7.5/325 of hydrocodone #40 with no refills

## 2012-08-13 NOTE — Telephone Encounter (Signed)
Prescription called in at this time for Norco 7.5/325mg  Take 1-2 every 4 hours as needed for pain #40 no refills.  [facility] [phone].  Patient updated at this time.

## 2012-08-13 NOTE — Telephone Encounter (Signed)
Robert with [facility] calling to make sure we new patient got percocet RX yesterday. I made him aware that we were switching her medication due to her negative reaction. He states he will override the insurance and call with any additional questions.

## 2012-08-14 ENCOUNTER — Encounter: Payer: Self-pay | Admitting: Physician Assistant

## 2012-08-14 ENCOUNTER — Ambulatory Visit (HOSPITAL_COMMUNITY)
Admission: RE | Admit: 2012-08-14 | Discharge: 2012-08-14 | Disposition: A | Discharge status: Home / Self Care | Payer: Medicaid Other | Source: Ambulatory Visit | Attending: Physician Assistant | Admitting: Physician Assistant

## 2012-08-14 ENCOUNTER — Ambulatory Visit: Payer: Medicaid Other | Admitting: Physician Assistant

## 2012-08-14 ENCOUNTER — Encounter: Payer: Self-pay | Admitting: Oncology

## 2012-08-14 ENCOUNTER — Other Ambulatory Visit (HOSPITAL_BASED_OUTPATIENT_CLINIC_OR_DEPARTMENT_OTHER): Payer: Medicaid Other | Admitting: Lab

## 2012-08-14 VITALS — BP 119/73 | HR 73 | Temp 97.4°F | Resp 18 | Ht 67.0 in | Wt 243.0 lb

## 2012-08-14 DIAGNOSIS — M25519 Pain in unspecified shoulder: Secondary | ICD-10-CM | POA: Insufficient documentation

## 2012-08-14 DIAGNOSIS — C50111 Malignant neoplasm of central portion of right female breast: Secondary | ICD-10-CM

## 2012-08-14 DIAGNOSIS — C44311 Basal cell carcinoma of skin of nose: Secondary | ICD-10-CM

## 2012-08-14 DIAGNOSIS — C50119 Malignant neoplasm of central portion of unspecified female breast: Secondary | ICD-10-CM | POA: Insufficient documentation

## 2012-08-14 DIAGNOSIS — M545 Low back pain, unspecified: Secondary | ICD-10-CM | POA: Insufficient documentation

## 2012-08-14 DIAGNOSIS — M25512 Pain in left shoulder: Secondary | ICD-10-CM

## 2012-08-14 DIAGNOSIS — C50919 Malignant neoplasm of unspecified site of unspecified female breast: Secondary | ICD-10-CM

## 2012-08-14 DIAGNOSIS — M51379 Other intervertebral disc degeneration, lumbosacral region without mention of lumbar back pain or lower extremity pain: Secondary | ICD-10-CM | POA: Insufficient documentation

## 2012-08-14 DIAGNOSIS — C50911 Malignant neoplasm of unspecified site of right female breast: Secondary | ICD-10-CM

## 2012-08-14 DIAGNOSIS — M5137 Other intervertebral disc degeneration, lumbosacral region: Secondary | ICD-10-CM | POA: Insufficient documentation

## 2012-08-14 DIAGNOSIS — J449 Chronic obstructive pulmonary disease, unspecified: Secondary | ICD-10-CM | POA: Insufficient documentation

## 2012-08-14 LAB — COMPREHENSIVE METABOLIC PANEL (CC13)
AST: 47 U/L — ABNORMAL HIGH (ref 5–34)
Alkaline Phosphatase: 91 U/L (ref 40–150)
BUN: 14.9 mg/dL (ref 7.0–26.0)
Creatinine: 1 mg/dL (ref 0.6–1.1)

## 2012-08-14 LAB — CBC WITH DIFFERENTIAL/PLATELET
Basophils Absolute: 0.1 10*3/uL (ref 0.0–0.1)
EOS%: 2.4 % (ref 0.0–7.0)
HCT: 45.9 % (ref 34.8–46.6)
HGB: 15.5 g/dL (ref 11.6–15.9)
LYMPH%: 35.6 % (ref 14.0–49.7)
MCH: 29.9 pg (ref 25.1–34.0)
MCV: 88.5 fL (ref 79.5–101.0)
MONO%: 9.4 % (ref 0.0–14.0)
NEUT%: 51.8 % (ref 38.4–76.8)

## 2012-08-14 MED ORDER — IPRATROPIUM-ALBUTEROL 18-103 MCG/ACT IN AERO: 2.0000 | INHALATION_SPRAY | Freq: Four times a day (QID) | RESPIRATORY_TRACT | Status: DC | PRN

## 2012-08-14 NOTE — Progress Notes (Signed)
ID: Tracy Neal OB: 06-30-1955  MR#: [id number]  CSN#:[id number]  PCP: Tracy D., PA-C GYN:   SU: Tracy Neal OTHER MD: Tracy Neal, Tracy Neal   HISTORY OF PRESENT ILLNESS: "Tracy Neal" tells me she herself found a lump in her right breast December of 2013. She brought it to the attention of the [facility] health department and they set her up for mammography at the [facility] 06/05/2012. This found her breasts to be almost entirely fat replaced. There was a 2.8 cm spiculated mass in the middle third of the right breast, with no microcalcifications. This mass was palpable at 12:00 6 cm from the nipple. Ultrasound confirmed a 2.7 cm irregular mass at the area in question. Otherwise ultrasound of the right axilla was benign.  Biopsy of the right breast mass 06/11/2012 ([id number]) showed an invasive ductal carcinoma, grade 2, estrogen receptor 90% positive, progesterone receptor negative, HER-2 not amplified, with an MIB-1 of 15%. The patient was set up for breast MRI, but was unable to tolerate the procedure because of claustrophobia. Her subsequent history is as detailed below.  INTERVAL HISTORY: Tracy Neal returns for followup of her right breast cancer. Since her last visit here, we obtained an Oncotype DX which showed her to be in the intermediate range with a score of 28. She is here today to discuss adjuvant therapy, and the likelihood of chemotherapy.  With regards to her breast cancer, Tracy Neal is healing well from her recent surgery. She did have multiple other concerns today, however. She has chronic left shoulder pain which has been bothering her. She has lower back pain on and off, and this has increased over the past week. She has a known basal cell carcinoma lesion on the right side of her nose. She was apparently scheduled to see a dermatologist at [facility], but this was scheduled for the same day as her breast surgery in the appointment had to be canceled. (She would prefer  to see a physician closer to home.)   REVIEW OF SYSTEMS: Tracy Neal has had no fevers or chills. She has occasional hot flashes. She denies any rashes or skin changes other than the lesion on her nose as noted above. She's had no signs of abnormal bleeding. She is extremely tired and fatigued, and feels weak. She has a lot of joint pain attributed to arthritis and this limits her mobility. She has an umbilical hernia which is slightly uncomfortable. She feels a little dizzy at times and worries about fallen. Fortunately, this has not yet occurred. She is eating well denies any nausea or change in bowel habits. She has a dry cough and continues to have shortness of breath associated with her COPD. This is stable. She denies any chest pain or palpitations. She's had no abnormal headaches or change in vision. She currently denies any peripheral swelling.  A detailed review of systems is otherwise stable.  PAST MEDICAL HISTORY: Past Medical History  Diagnosis Date  . COPD (chronic obstructive pulmonary disease)   . Breast cancer   . Depression   . Umbilical hernia   . COPD, moderate   . Basal cell carcinoma     not biopsy confirmed; Right bridge of nose    PAST SURGICAL HISTORY: Past Surgical History  Procedure Laterality Date  . Cholecystectomy    . Tubal ligation    . Breast lumpectomy Right 07/15/2012    Procedure: BREAST LUMPECTOMY WITH AXILLARY LYMPH NODE BIOPSY;  Surgeon: Tracy Rubenstein, MD;  Location: [facility] OR;  Service: General;  Laterality: Right;  Nuclear medicine injection 9:30     FAMILY HISTORY Family History  Problem Relation Age of Onset  . Diabetes Mother    the patient's father committed suicide at the age of 72. The patient's mother died when the patient was age 57. The patient was adopted by her uncle, who died at age 45. Her adopted mother died at age 63 with MS. The patient has one full sister, 2 half-sisters and 4 half-brothers. No one in the family has a history of  cancer to the patient's knowledge  GYNECOLOGIC HISTORY:  Menarche age 48, first live birth age 59. She is GX P2 She stopped having periods about 1988. She never took hormone replacement. She never took birth control pills.  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 She is divorced. She lives alone, with no pets in an [place] "project." Her son [name] lives in [place] and works in Aeronautical engineer. The patient's other her son was put up for adoption by the patient's former husband (they had each been granted custody of one child). She tells me that he is in touch with him, his name is [name], and he lives in [place], where he works at detailing cars. The patient has 3 granddaughters. She attends a [employer]    ADVANCED DIRECTIVES: Not in place. The patient was not able to give me a clear answer about whom she would like to make medical decisions for her if that became necessary.   HEALTH MAINTENANCE: History  Substance Use Topics  . Smoking status: Current Every Day Smoker -- 1.00 packs/day for 40 years    Types: Cigarettes  . Smokeless tobacco: Never Used  . Alcohol Use: No     Colonoscopy: Never  PAP: April 2014  Bone density: Never  Lipid panel:  Allergies  Allergen Reactions  . Percocet (Oxycodone-Acetaminophen) Itching    And nausea    Current Outpatient Prescriptions  Medication Sig Dispense Refill  . albuterol-ipratropium (COMBIVENT) 18-103 MCG/ACT inhaler Inhale 2 puffs into the lungs every 6 (six) hours as needed for wheezing.  14.7 g  0  . famotidine-calcium carbonate-magnesium hydroxide (PEPCID COMPLETE) 10-800-165 MG CHEW Chew 1 tablet by mouth 2 (two) times daily as needed. For heartburn      . HYDROcodone-acetaminophen (NORCO/VICODIN) 5-325 MG per tablet TAKE ONE TABLET BY MOUTH EVERY 4 HOURS AS NEEDED FOR  PAIN  30 tablet  0  . ibuprofen (ADVIL,MOTRIN) 200 MG tablet Take 200 mg by mouth every 6 (six) hours as needed for pain.      Marland Kitchen acetaminophen (TYLENOL) 500 MG tablet Take 500 mg by mouth every 6 (six) hours as needed for pain.      . cephALEXin (KEFLEX) 500 MG capsule Take 1 capsule (500 mg total) by mouth 3 (three) times daily.  21 capsule  0   No current facility-administered medications for this visit.    OBJECTIVE: Middle-aged white woman in no acute distress Filed Vitals:   08/14/12 1345  BP: 119/73  Pulse: 73  Temp: 97.4 F (36.3 C)  Resp: 18     Body mass index is 38.05 kg/(m^2).    ECOG FS: 2 Filed Weights   08/14/12 1345  Weight: 243 lb (110.224 kg)   There is a dry "scabby" appearing lesion measuring approximately 1 cm in diameter on  the right side of the patient's nose. Sclerae unicteric Oropharynx clear No cervical or supraclavicular adenopathy Lungs no rales or rhonchi, fair excursion bilaterally, although breath sounds are slightly diminished bilaterally in the bases Heart regular rate and rhythm, no murmur appreciated Abdomen obese, obvious umbilical hernia, no bowel sounds in hernia, otherwise no tenderness to palpation. No additional masses are palpated. Bowel sounds are otherwise normal.  MSK no focal spinal tenderness Neuro: non-focal, well-oriented, friendly affect Breasts: The right breast is status post lumpectomy. It is well healed, with no evidence of disease recurrence. Left breast is unremarkable. Axillae are benign bilaterally, no palpable adenopathy noted.   minimally erythematous but does appear swollen as compared to the left breast. There is no nipple change and the right axilla is benign. The left breast is unremarkable   LAB RESULTS:  Lab Results  Component Value Date   WBC 8.4 08/14/2012   NEUTROABS 4.4 08/14/2012   HGB 15.5 08/14/2012   HCT 45.9 08/14/2012   MCV 88.5 08/14/2012   PLT 267 08/14/2012      Chemistry      Component Value  Date/Time   NA 139 08/14/2012 1317   NA 138 07/09/2012 1612   K 3.9 08/14/2012 1317   K 4.6 07/09/2012 1612   CL 101 07/09/2012 1612   CL 106 06/19/2012 0830   CO2 24 08/14/2012 1317   CO2 28 07/09/2012 1612   BUN 14.9 08/14/2012 1317   BUN 13 07/09/2012 1612   CREATININE 1.0 08/14/2012 1317   CREATININE 1.01 07/09/2012 1612      Component Value Date/Time   CALCIUM 9.7 08/14/2012 1317   CALCIUM 9.6 07/09/2012 1612   ALKPHOS 91 08/14/2012 1317   AST 47* 08/14/2012 1317   ALT 51 08/14/2012 1317   BILITOT 0.35 08/14/2012 1317      STUDIES:   Dg Chest 2 View  07/09/2012   *RADIOLOGY REPORT*  Clinical Data: Preoperative evaluation for right breast surgery. History of COPD.  Smoking history one pack per day  CHEST - 2 VIEW  Comparison: 01/01/2011  Findings: Heart and mediastinal contours are within normal limits. The lung fields appear clear with no signs of focal infiltrate or congestive failure.  No pleural fluid or significant peribronchial cuffing is identified.  Bony structures demonstrate mild degenerative change of the mid thoracic spine and are otherwise intact.  IMPRESSION: Unremarkable cardiopulmonary appearance with no new focal or acute abnormality seen.   Original Report Authenticated By: Rhodia Albright, M.D.     ASSESSMENT: 57 y.o. [place], [place] woman   (1)  status post right breast lumpectomy and sentinel lymph node sampling 07/15/2012 for a pT2 pN0, stage IIA invasive ductal carcinoma, grade 2, estrogen receptor 90% positive, progesterone receptor negative, HER-2 not amplified, with an MIB-1 of 15%  (2) Oncotype DX with a score of 28, intermediate range  (3)  patient prefers to be treated at [facility] which is closer to her home, and she is being referred for adjuvant therapy which will include chemotherapy, radiation therapy, and antiestrogen therapy.  (4)  no basal cell carcinoma on the right side of her nose, being referred to dermatology for removal  (5)  chronic lower back pain and  left shoulder pain   PLAN: Dr. Darnelle Catalan and I both spoke to the patient together today, and over half of our 45 minute appointment today was spent reviewing her treatment options, and coordinating care.  With an Oncotype DX score of 28, and an age of 57,  Dr. Darnelle Catalan feels that Atoya would benefit from receiving chemotherapy. She lives in [place], but would like to be treated in [place] at any can, so we will try to place that referral and get her in as soon as possible to initiate adjuvant therapy. She will receive chemotherapy, followed by radiation therapy, and of course that'll be followed by antiestrogen therapy as well. Although I have not scheduled her a followup appointment here at this time, she knows that she will continue to be our patient is well, and we will likely follow up with her once her adjuvant therapy has been completed through [facility].   With regards to her other concerns, I am sending Mahli for plain film x-rays of the lower back and left shoulder today. If necessary, we will refer her for additional orthopedic evaluation. We're also placing a referral to a dermatologist in [place] for removal of the node basal cell carcinoma on the right side of her nose. I have given her an order today for a shower chair due to weakness and fall risk. I have also refilled her an inhaler. She will also need to be seen by a general surgeon for further evaluation of her umbilical hernia, but this will need to be done after she completes her chemotherapy and radiation.   This was all reviewed in detail with Gavin Pound today. She was given a copy of her Oncotype DX results. She voices understanding and agreement with our plan, and knows to call with any changes or problems prior to her next appointment.   Alcus Bradly, PA-C   08/14/2012 4:41 PM

## 2012-08-14 NOTE — Progress Notes (Signed)
Called and left the patient a message to call and let me know what should be paid as far as his [employer] bill??

## 2012-08-15 ENCOUNTER — Other Ambulatory Visit: Payer: Self-pay | Admitting: Nurse Practitioner

## 2012-08-15 DIAGNOSIS — J449 Chronic obstructive pulmonary disease, unspecified: Secondary | ICD-10-CM

## 2012-08-15 MED ORDER — IPRATROPIUM-ALBUTEROL 18-103 MCG/ACT IN AERO: 1.0000 | INHALATION_SPRAY | Freq: Four times a day (QID) | RESPIRATORY_TRACT | Status: DC

## 2012-08-17 ENCOUNTER — Encounter: Payer: Self-pay | Admitting: Oncology

## 2012-08-20 ENCOUNTER — Telehealth: Payer: Self-pay | Admitting: Oncology

## 2012-08-20 ENCOUNTER — Telehealth: Payer: Self-pay | Admitting: *Deleted

## 2012-08-20 ENCOUNTER — Telehealth (INDEPENDENT_AMBULATORY_CARE_PROVIDER_SITE_OTHER): Payer: Self-pay

## 2012-08-20 NOTE — Telephone Encounter (Signed)
The pt called requesting more Hydrocodone 7.5/325.  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  She had a right lumpectomy on 07/15/2012.  She will run out of medicine on Saturday and will be out of town.  She states she has a little bit of decreased appetite and she can't sleep good.  She uses the [facility] in [place].  I told her Dr Magnus Ivan is unavailable but I can ask another md if she can have a refill.  Dr Jamey Ripa is in the urgent clinic today and I will ask him.

## 2012-08-20 NOTE — Telephone Encounter (Signed)
I called and let the pt know that Dr Jamey Ripa wrote her a prescription.  She wants it called in because she has no ride.  I called in Norco 7.5/325 po #20 one po q 4hrs prn no refill to [facility] [phone].

## 2012-08-20 NOTE — Telephone Encounter (Signed)
Pt called to this RN with multiple concerns - She states she is unable to get radiation in [place] under Dr Margo Aye due to " he doesn't accept medicaid ". Tracy Neal will be returning to [facility] for therapy.  Discussed xray and [name] states she is wondering what else can be done for " back spasms " she is using vicodin with good relief " but I need a refill by next week ".  She also inquired about a referral letter Amy was to give to her.  Return call number given as [phone].  This note will be given to Ab/PA for review.

## 2012-08-22 ENCOUNTER — Other Ambulatory Visit: Payer: Self-pay | Admitting: Physician Assistant

## 2012-08-22 ENCOUNTER — Telehealth: Payer: Self-pay | Admitting: *Deleted

## 2012-08-22 DIAGNOSIS — C50111 Malignant neoplasm of central portion of right female breast: Secondary | ICD-10-CM

## 2012-08-22 DIAGNOSIS — M545 Low back pain: Secondary | ICD-10-CM

## 2012-08-22 DIAGNOSIS — M25512 Pain in left shoulder: Secondary | ICD-10-CM

## 2012-08-22 MED ORDER — TRAMADOL HCL 50 MG PO TABS: 50.0000 mg | ORAL_TABLET | Freq: Four times a day (QID) | ORAL | Status: DC | PRN

## 2012-08-22 NOTE — Telephone Encounter (Signed)
I called patient to clarify and answer any questions about her appointments and plan of care.  She was confused that the referral to Dr. Margo Aye, who is not accepting Medicaid patients, was for Radiation Therapy.  I explained to her that he was a dermatologist.  We discussed other dermatologists in [place] and she suggested Dr. Orvan Falconer.  We also discussed a consult with an orthopedist for her back pain and she had no preference.  We clarified that she prefers to have her Radiation treatments at [facility] rather than [place].  She also requested a prescription for back pain.  I spoke to Dr. Darrall Dears RN who will make referrals to dermatologist, orthopedist and [facility] Radiation Oncologist.  I spoke to Zollie Scale, PA who will call in prescription for Tramadol.  We also reviewed her upcoming appointment for f/u with Dr. Magnus Ivan.  Patient verbalized understanding and denied any other questions.  I instructed patient to call me if she has any needs or concerns.

## 2012-08-22 NOTE — Progress Notes (Signed)
Clelia Schaumann, RN spoke with the patient today.  She continues to have back pain and shoulder pain. We are in the process of getting her a referral to an orthopedist, and in the meanwhile I'm giving her a prescription for tramadol, 50 mg up to 4 times daily as needed for pain. This was called into her pharmacy, [facility] in [place].  Zollie Scale, PA-C 08/22/2012

## 2012-08-26 ENCOUNTER — Telehealth: Payer: Self-pay | Admitting: *Deleted

## 2012-08-26 NOTE — Telephone Encounter (Signed)
Patient called me because she received a letter in the mail with an appointment with the Cancer Center at [facility] and she preferred to have her radiation therapy treatments at [facility].  I followed up with Val, Dr. Darrall Dears nurse who will follow up with [facility] and schedule appointment with Radiation Oncologist at [facility].  I called patient and left a voicemail message with update.

## 2012-08-29 ENCOUNTER — Telehealth: Payer: Self-pay | Admitting: *Deleted

## 2012-08-29 NOTE — Telephone Encounter (Signed)
Patient called and left a voicemail message with concern that she has not had a bowel movement in 3 days.  She reports that she has taken some generic stool softeners and laxatives without results.  I consulted Zollie Scale, PA and called patient back and instructed her to try Colace and Miralax.  I instructed her to call me if she does not get relief or if she has any other needs or concerns.  Patient verbalized understanding.

## 2012-08-30 ENCOUNTER — Other Ambulatory Visit: Payer: Self-pay | Admitting: *Deleted

## 2012-09-02 ENCOUNTER — Telehealth: Payer: Self-pay | Admitting: *Deleted

## 2012-09-02 NOTE — Telephone Encounter (Signed)
sw pt made her aware that Dr. Christain Sacramento is not taking any new medicaid pts at the moment. i gve her the number to make an appt w/ [facility]ist. i also emailed VD making her aware of what was stated by [facility] office...Tracy KitchenMarland KitchenMarland Neal

## 2012-09-03 ENCOUNTER — Telehealth: Payer: Self-pay | Admitting: *Deleted

## 2012-09-03 ENCOUNTER — Encounter (INDEPENDENT_AMBULATORY_CARE_PROVIDER_SITE_OTHER): Payer: Medicaid Other | Admitting: Surgery

## 2012-09-03 ENCOUNTER — Other Ambulatory Visit: Payer: Self-pay | Admitting: Physician Assistant

## 2012-09-03 DIAGNOSIS — C50111 Malignant neoplasm of central portion of right female breast: Secondary | ICD-10-CM

## 2012-09-03 NOTE — Telephone Encounter (Signed)
Patient called to request that her prescription for Tramadol be renewed. I spoke with Zollie Scale, PA who will call in another prescription.  We also discussed where she prefers to go for her radiation therapy treatments and she prefers to have them in [place] but prefers to continue her medical oncology care at [facility] in [place]. We reviewed what symptoms she might experience while undergoing radiation therapy.  Patient denied any other questions or concerns at this time.  I encouraged her to call for any other needs.

## 2012-09-03 NOTE — Telephone Encounter (Signed)
Patient was called to schedule appointments and appeared to be confused about her treatment plan.  I called patient to review plan using teach back.  Patient will have "new patient" appointment at [facility] 09/12/12 for consult to begin chemotherapy treatment.  We discussed that she would probably receive 5 - 6 cycles of treatment occurring every 21 days and that she may need to have labwork done weekly.  We discussed that after chemotherapy is complete, she will need radiation therapy which will be completed at [facility] in [place] and that these treatments may include several weeks of daily treatments.  We also discussed that when her radiation treatments are completed, she will follow up with her medical oncologist at [facility] and will start treatment with anti-estrogen oral therapy.  We also reviewed her upcoming appointment schedule.  Patient was able to teach back her plan of care.  She also confirmed that she was comfortable with receiving her chemotherapy and medical oncology care at [facility] and Radiation Therapy at [facility]  in [place].  Patient verbalized understanding and denied any other questions at this time.

## 2012-09-04 ENCOUNTER — Other Ambulatory Visit: Payer: Self-pay | Admitting: Physician Assistant

## 2012-09-04 ENCOUNTER — Telehealth: Payer: Self-pay | Admitting: *Deleted

## 2012-09-04 DIAGNOSIS — M545 Low back pain: Secondary | ICD-10-CM

## 2012-09-04 DIAGNOSIS — C50111 Malignant neoplasm of central portion of right female breast: Secondary | ICD-10-CM

## 2012-09-04 DIAGNOSIS — M25512 Pain in left shoulder: Secondary | ICD-10-CM

## 2012-09-04 MED ORDER — TRAMADOL HCL 50 MG PO TABS: 50.0000 mg | ORAL_TABLET | Freq: Four times a day (QID) | ORAL | Status: DC | PRN

## 2012-09-04 NOTE — Telephone Encounter (Signed)
Patient called me regarding her request for a renewal of her Tramadol prescription.  She also said that the [facility] was going to make a referral to an orthopedist and needed copies of her recent xrays.  After discussing with Zollie Scale, PA and [facility] Department, I notified her that her prescription had been called to [facility] and that the Health Department needs to fax a request and release form to the [facility] HIM Department to obtain the xrays.  I gave her the phone and fax numbers to [facility] Dept.  I also informed her that her appointment with Amy Allyson Sabal will be cancelled on 09/06/12 since she will be seen at [facility] on 09/12/12.  In f/u to Dr. Orvan Falconer, Dermatologist, being unable to accept her as a patient, I instructed her to let me know what dermatologist in [place] is accepting new patients and that we will assist with making the referral.  Patient was able to teach back the instructions and denied any other questions or concerns at this time.

## 2012-09-06 ENCOUNTER — Ambulatory Visit: Payer: Medicaid Other | Admitting: Physician Assistant

## 2012-09-06 ENCOUNTER — Other Ambulatory Visit: Payer: Medicaid Other | Admitting: Lab

## 2012-09-06 ENCOUNTER — Other Ambulatory Visit: Payer: Self-pay | Admitting: Physician Assistant

## 2012-09-10 ENCOUNTER — Encounter (INDEPENDENT_AMBULATORY_CARE_PROVIDER_SITE_OTHER): Payer: Medicaid Other | Admitting: Surgery

## 2012-09-12 ENCOUNTER — Encounter (HOSPITAL_COMMUNITY): Payer: Self-pay

## 2012-09-12 ENCOUNTER — Telehealth (HOSPITAL_COMMUNITY): Payer: Self-pay | Admitting: *Deleted

## 2012-09-12 ENCOUNTER — Other Ambulatory Visit (HOSPITAL_COMMUNITY): Payer: Self-pay | Admitting: Hematology and Oncology

## 2012-09-12 ENCOUNTER — Encounter (HOSPITAL_COMMUNITY): Payer: Medicaid Other | Attending: Hematology and Oncology

## 2012-09-12 VITALS — BP 123/76 | HR 73 | Temp 97.5°F | Resp 20 | Ht 68.0 in | Wt 244.5 lb

## 2012-09-12 DIAGNOSIS — C4491 Basal cell carcinoma of skin, unspecified: Secondary | ICD-10-CM

## 2012-09-12 DIAGNOSIS — C50919 Malignant neoplasm of unspecified site of unspecified female breast: Secondary | ICD-10-CM

## 2012-09-12 DIAGNOSIS — F17200 Nicotine dependence, unspecified, uncomplicated: Secondary | ICD-10-CM

## 2012-09-12 DIAGNOSIS — C50911 Malignant neoplasm of unspecified site of right female breast: Secondary | ICD-10-CM

## 2012-09-12 DIAGNOSIS — L989 Disorder of the skin and subcutaneous tissue, unspecified: Secondary | ICD-10-CM

## 2012-09-12 DIAGNOSIS — Z170 Estrogen receptor positive status [ER+]: Secondary | ICD-10-CM

## 2012-09-12 NOTE — Patient Instructions (Addendum)
[facility] Discharge Instructions  RECOMMENDATIONS MADE BY THE CONSULTANT AND ANY TEST RESULTS WILL BE SENT TO YOUR REFERRING PHYSICIAN.  EXAM FINDINGS BY THE PHYSICIAN TODAY AND SIGNS OR SYMPTOMS TO REPORT TO CLINIC OR PRIMARY PHYSICIAN:   You will be receiving Taxotere and Cytoxan every 21 days x 4 cycles. These 4 cycles will take approximately 3.5-4 months to complete. You will also get a shot called Neulasta the day after chemo.   We will do chemo teaching next week. Everything you need to know about your drugs and treatment will be discussed at this visit.   Please call [facility] or [facility] regarding a free clinic for dermatology to have this basal cell carcinoma removed.   We will contact Dr. Magnus Ivan regarding a port consult.   [facility] Department of Dermatology is [phone] Depatment of Dermatology [phone]   Thank you for choosing [facility] to provide your oncology and hematology care.  To afford each patient quality time with our providers, please arrive at least 15 minutes before your scheduled appointment time.  With your help, our goal is to use those 15 minutes to complete the necessary work-up to ensure our physicians have the information they need to help with your evaluation and healthcare recommendations.    Effective January 1st, 2014, we ask that you re-schedule your appointment with our physicians should you arrive 10 or more minutes late for your appointment.  We strive to give you quality time with our providers, and arriving late affects you and other patients whose appointments are after yours.    Again, thank you for choosing [facility].  Our hope is that these requests will decrease the amount of time that you wait before being seen by our physicians.       _____________________________________________________________  Should you have questions after your visit to [facility] : [facility], please contact our office at [phone] between the hours of 8:30 a.m. and 5:00 p.m.  Voicemails left after 4:30 p.m. will not be returned until the following business day.  For prescription refill requests, have your pharmacy contact our office with your prescription refill request.    Docetaxel injection What is this medicine? DOCETAXEL (doe se TAX el) is a chemotherapy drug. It targets fast dividing cells, like cancer cells, and causes these cells to die. This medicine is used to treat many types of cancers like breast cancer, certain stomach cancers, head and neck cancer, lung cancer, and prostate cancer. This medicine may be used for other purposes; ask your health care provider or pharmacist if you have questions. What should I tell my health care provider before I take this medicine? They need to know if you have any of these conditions: -infection (especially a virus infection such as chickenpox, cold sores, or herpes) -liver disease -low blood counts, like low white cell, platelet, or red cell counts -an unusual or allergic reaction to docetaxel, polysorbate 80, other chemotherapy agents, other medicines, foods, dyes, or preservatives -pregnant or trying to get pregnant -breast-feeding How should I use this medicine? This drug is given as an infusion into a vein. It is administered in a hospital or clinic by a specially trained health care professional. Talk to your pediatrician regarding the use of this medicine in children. Special care may be needed. Overdosage: If you think you have taken too much of this medicine contact a poison control center or emergency room at once. NOTE: This  medicine is only for you. Do not share this medicine with others. What if I miss a dose? It is important not to miss your dose. Call your doctor or health care professional if you are unable to keep an appointment. What may interact with this  medicine? -cyclosporine -erythromycin -ketoconazole -medicines to increase blood counts like filgrastim, pegfilgrastim, sargramostim -vaccines Talk to your doctor or health care professional before taking any of these medicines: -acetaminophen -aspirin -ibuprofen -ketoprofen -naproxen This list may not describe all possible interactions. Give your health care provider a list of all the medicines, herbs, non-prescription drugs, or dietary supplements you use. Also tell them if you smoke, drink alcohol, or use illegal drugs. Some items may interact with your medicine. What should I watch for while using this medicine? Your condition will be monitored carefully while you are receiving this medicine. You will need important blood work done while you are taking this medicine. This drug may make you feel generally unwell. This is not uncommon, as chemotherapy can affect healthy cells as well as cancer cells. Report any side effects. Continue your course of treatment even though you feel ill unless your doctor tells you to stop. In some cases, you may be given additional medicines to help with side effects. Follow all directions for their use. Call your doctor or health care professional for advice if you get a fever, chills or sore throat, or other symptoms of a cold or flu. Do not treat yourself. This drug decreases your body's ability to fight infections. Try to avoid being around people who are sick. This medicine may increase your risk to bruise or bleed. Call your doctor or health care professional if you notice any unusual bleeding. Be careful brushing and flossing your teeth or using a toothpick because you may get an infection or bleed more easily. If you have any dental work done, tell your dentist you are receiving this medicine. Avoid taking products that contain aspirin, acetaminophen, ibuprofen, naproxen, or ketoprofen unless instructed by your doctor. These medicines may hide a fever. Do  not become pregnant while taking this medicine. Women should inform their doctor if they wish to become pregnant or think they might be pregnant. There is a potential for serious side effects to an unborn child. Talk to your health care professional or pharmacist for more information. Do not breast-feed an infant while taking this medicine. What side effects may I notice from receiving this medicine? Side effects that you should report to your doctor or health care professional as soon as possible: -allergic reactions like skin rash, itching or hives, swelling of the face, lips, or tongue -low blood counts - This drug may decrease the number of white blood cells, red blood cells and platelets. You may be at increased risk for infections and bleeding. -signs of infection - fever or chills, cough, sore throat, pain or difficulty passing urine -signs of decreased platelets or bleeding - bruising, pinpoint red spots on the skin, black, tarry stools, nosebleeds -signs of decreased red blood cells - unusually weak or tired, fainting spells, lightheadedness -breathing problems -fast or irregular heartbeat -low blood pressure -mouth sores -nausea and vomiting -pain, swelling, redness or irritation at the injection site -pain, tingling, numbness in the hands or feet -swelling of the ankle, feet, hands -weight gain Side effects that usually do not require medical attention (report to your prescriber or health care professional if they continue or are bothersome): -bone pain -complete hair loss including hair on your  head, underarms, pubic hair, eyebrows, and eyelashes -diarrhea -excessive tearing -changes in the color of fingernails -loosening of the fingernails -nausea -muscle pain -red flush to skin -sweating -weak or tired This list may not describe all possible side effects. Call your doctor for medical advice about side effects. You may report side effects to FDA at 1-800-FDA-1088. Where  should I keep my medicine? This drug is given in a hospital or clinic and will not be stored at home. NOTE: This sheet is a summary. It may not cover all possible information. If you have questions about this medicine, talk to your doctor, pharmacist, or health care provider.  2013, [employer] Standard. (12/16/2007 11:52:10 AM) Cyclophosphamide injection What is this medicine? CYCLOPHOSPHAMIDE (sye kloe FOSS fa mide) is a chemotherapy drug. It slows the growth of cancer cells. This medicine is used to treat many types of cancer like lymphoma, myeloma, leukemia, breast cancer, and ovarian cancer, to name a few. It is also used to treat nephrotic syndrome in children. This medicine may be used for other purposes; ask your health care provider or pharmacist if you have questions. What should I tell my health care provider before I take this medicine? They need to know if you have any of these conditions: -blood disorders -history of other chemotherapy -history of radiation therapy -infection -kidney disease -liver disease -tumors in the bone marrow -an unusual or allergic reaction to cyclophosphamide, other chemotherapy, other medicines, foods, dyes, or preservatives -pregnant or trying to get pregnant -breast-feeding How should I use this medicine? This drug is usually given as an injection into a vein or muscle or by infusion into a vein. It is administered in a hospital or clinic by a specially trained health care professional. Talk to your pediatrician regarding the use of this medicine in children. While this drug may be prescribed for selected conditions, precautions do apply. Overdosage: If you think you have taken too much of this medicine contact a poison control center or emergency room at once. NOTE: This medicine is only for you. Do not share this medicine with others. What if I miss a dose? It is important not to miss your dose. Call your doctor or health care professional if  you are unable to keep an appointment. What may interact with this medicine? Do not take this medicine with any of the following medications: -mibefradil -nalidixic acid This medicine may also interact with the following medications: -doxorubicin -etanercept -medicines to increase blood counts like filgrastim, pegfilgrastim, sargramostim -medicines that block muscle or nerve pain -St. John's Wort -phenobarbital -succinylcholine chloride -trastuzumab -vaccines Talk to your doctor or health care professional before taking any of these medicines: -acetaminophen -aspirin -ibuprofen -ketoprofen -naproxen This list may not describe all possible interactions. Give your health care provider a list of all the medicines, herbs, non-prescription drugs, or dietary supplements you use. Also tell them if you smoke, drink alcohol, or use illegal drugs. Some items may interact with your medicine. What should I watch for while using this medicine? Visit your doctor for checks on your progress. This drug may make you feel generally unwell. This is not uncommon, as chemotherapy can affect healthy cells as well as cancer cells. Report any side effects. Continue your course of treatment even though you feel ill unless your doctor tells you to stop. Drink water or other fluids as directed. Urinate often, even at night. In some cases, you may be given additional medicines to help with side effects. Follow all directions for their  use. Call your doctor or health care professional for advice if you get a fever, chills or sore throat, or other symptoms of a cold or flu. Do not treat yourself. This drug decreases your body's ability to fight infections. Try to avoid being around people who are sick. This medicine may increase your risk to bruise or bleed. Call your doctor or health care professional if you notice any unusual bleeding. Be careful brushing and flossing your teeth or using a toothpick because you may  get an infection or bleed more easily. If you have any dental work done, tell your dentist you are receiving this medicine. Avoid taking products that contain aspirin, acetaminophen, ibuprofen, naproxen, or ketoprofen unless instructed by your doctor. These medicines may hide a fever. Do not become pregnant while taking this medicine. Women should inform their doctor if they wish to become pregnant or think they might be pregnant. There is a potential for serious side effects to an unborn child. Talk to your health care professional or pharmacist for more information. Do not breast-feed an infant while taking this medicine. Men should inform their doctor if they wish to father a child. This medicine may lower sperm counts. If you are going to have surgery, tell your doctor or health care professional that you have taken this medicine. What side effects may I notice from receiving this medicine? Side effects that you should report to your doctor or health care professional as soon as possible: -allergic reactions like skin rash, itching or hives, swelling of the face, lips, or tongue -low blood counts - this medicine may decrease the number of white blood cells, red blood cells and platelets. You may be at increased risk for infections and bleeding. -signs of infection - fever or chills, cough, sore throat, pain or difficulty passing urine -signs of decreased platelets or bleeding - bruising, pinpoint red spots on the skin, black, tarry stools, blood in the urine -signs of decreased red blood cells - unusually weak or tired, fainting spells, lightheadedness -breathing problems -dark urine -mouth sores -pain, swelling, redness at site where injected -swelling of the ankles, feet, hands -trouble passing urine or change in the amount of urine -weight gain -yellowing of the eyes or skin Side effects that usually do not require medical attention (report to your doctor or health care professional if  they continue or are bothersome): -changes in nail or skin color -diarrhea -hair loss -loss of appetite -missed menstrual periods -nausea, vomiting -stomach pain This list may not describe all possible side effects. Call your doctor for medical advice about side effects. You may report side effects to FDA at 1-800-FDA-1088. Where should I keep my medicine? This drug is given in a hospital or clinic and will not be stored at home. NOTE: This sheet is a summary. It may not cover all possible information. If you have questions about this medicine, talk to your doctor, pharmacist, or health care provider.  2013, [employer]/Gold Standard. (04/09/2007 2:32:25 PM) Pegfilgrastim injection What is this medicine? PEGFILGRASTIM (peg fil GRA stim) helps the body make more white blood cells. It is used to prevent infection in people with low amounts of white blood cells following cancer treatment. This medicine may be used for other purposes; ask your health care provider or pharmacist if you have questions. What should I tell my health care provider before I take this medicine? They need to know if you have any of these conditions: -sickle cell disease -an unusual or allergic reaction  to pegfilgrastim, filgrastim, E.coli protein, other medicines, foods, dyes, or preservatives -pregnant or trying to get pregnant -breast-feeding How should I use this medicine? This medicine is for injection under the skin. It is usually given by a health care professional in a hospital or clinic setting. If you get this medicine at home, you will be taught how to prepare and give this medicine. Do not shake this medicine. Use exactly as directed. Take your medicine at regular intervals. Do not take your medicine more often than directed. It is important that you put your used needles and syringes in a special sharps container. Do not put them in a trash can. If you do not have a sharps container, call your pharmacist or  healthcare provider to get one. Talk to your pediatrician regarding the use of this medicine in children. While this drug may be prescribed for children who weigh more than 45 kg for selected conditions, precautions do apply Overdosage: If you think you have taken too much of this medicine contact a poison control center or emergency room at once. NOTE: This medicine is only for you. Do not share this medicine with others. What if I miss a dose? If you miss a dose, take it as soon as you can. If it is almost time for your next dose, take only that dose. Do not take double or extra doses. What may interact with this medicine? -lithium -medicines for growth therapy This list may not describe all possible interactions. Give your health care provider a list of all the medicines, herbs, non-prescription drugs, or dietary supplements you use. Also tell them if you smoke, drink alcohol, or use illegal drugs. Some items may interact with your medicine. What should I watch for while using this medicine? Visit your doctor for regular check ups. You will need important blood work done while you are taking this medicine. What side effects may I notice from receiving this medicine? Side effects that you should report to your doctor or health care professional as soon as possible: -allergic reactions like skin rash, itching or hives, swelling of the face, lips, or tongue -breathing problems -fever -pain, redness, or swelling where injected -shoulder pain -stomach or side pain Side effects that usually do not require medical attention (report to your doctor or health care professional if they continue or are bothersome): -aches, pains -headache -loss of appetite -nausea, vomiting -unusually tired This list may not describe all possible side effects. Call your doctor for medical advice about side effects. You may report side effects to FDA at 1-800-FDA-1088. Where should I keep my medicine? Keep out of  the reach of children. Store in a refrigerator between 2 and 8 degrees C (36 and 46 degrees F). Do not freeze. Keep in carton to protect from light. Throw away this medicine if it is left out of the refrigerator for more than 48 hours. Throw away any unused medicine after the expiration date. NOTE: This sheet is a summary. It may not cover all possible information. If you have questions about this medicine, talk to your doctor, pharmacist, or health care provider.  2013, [employer]/Gold Standard. (08/05/2007 3:41:44 PM) Implanted Port Instructions An implanted port is a central line that has a round shape and is placed under the skin. It is used for long-term IV (intravenous) access for:  Medicine.  Fluids.  Liquid nutrition, such as TPN (total parenteral nutrition).  Blood samples. Ports can be placed:  In the chest area just below the collarbone (  this is the most common place.)  In the arms.  In the belly (abdomen) area.  In the legs. PARTS OF THE PORT A port has 2 main parts:  The reservoir. The reservoir is round, disc-shaped, and will be a small, raised area under your skin.  The reservoir is the part where a needle is inserted (accessed) to either give medicines or to draw blood.  The catheter. The catheter is a long, slender tube that extends from the reservoir. The catheter is placed into a large vein.  Medicine that is inserted into the reservoir goes into the catheter and then into the vein. INSERTION OF THE PORT  The port is surgically placed in either an operating room or in a procedural area (interventional radiology).  Medicine may be given to help you relax during the procedure.  The skin where the port will be inserted is numbed (local anesthetic).  1 or 2 small cuts (incisions) will be made in the skin to insert the port.  The port can be used after it has been inserted. INCISION SITE CARE  The incision site may have small adhesive strips on it. This helps  keep the incision site closed. Sometimes, no adhesive strips are placed. Instead of adhesive strips, a special kind of surgical glue is used to keep the incision closed.  If adhesive strips were placed on the incision sites, do not take them off. They will fall off on their own.  The incision site may be sore for 1 to 2 days. Pain medicine can help.  Do not get the incision site wet. Bathe or shower as directed by your caregiver.  The incision site should heal in 5 to 7 days. A small scar may form after the incision has healed. ACCESSING THE PORT Special steps must be taken to access the port:  Before the port is accessed, a numbing cream can be placed on the skin. This helps numb the skin over the port site.  A sterile technique is used to access the port.  The port is accessed with a needle. Only "non-coring" port needles should be used to access the port. Once the port is accessed, a blood return should be checked. This helps ensure the port is in the vein and is not clogged (clotted).  If your caregiver believes your port should remain accessed, a clear (transparent) bandage will be placed over the needle site. The bandage and needle will need to be changed every week or as directed by your caregiver.  Keep the bandage covering the needle clean and dry. Do not get it wet. Follow your caregiver's instructions on how to take a shower or bath when the port is accessed.  If your port does not need to stay accessed, no bandage is needed over the port. FLUSHING THE PORT Flushing the port keeps it from getting clogged. How often the port is flushed depends on:  If a constant infusion is running. If a constant infusion is running, the port may not need to be flushed.  If intermittent medicines are given.  If the port is not being used. For intermittent medicines:  The port will need to be flushed:  After medicines have been given.  After blood has been drawn.  As part of routine  maintenance.  A port is normally flushed with:  Normal saline.  Heparin.  Follow your caregiver's advice on how often, how much, and the type of flush to use on your port. IMPORTANT PORT INFORMATION  Tell your caregiver if you are allergic to heparin.  After your port is placed, you will get a manufacturer's information card. The card has information about your port. Keep this card with you at all times.  There are many types of ports available. Know what kind of port you have.  In case of an emergency, it may be helpful to wear a medical alert bracelet. This can help alert health care workers that you have a port.  The port can stay in for as long as your caregiver believes it is necessary.  When it is time for the port to come out, surgery will be done to remove it. The surgery will be similar to how the port was put in.  If you are in the hospital or clinic:  Your port will be taken care of and flushed by a nurse.  If you are at home:  A home health care nurse may give medicines and take care of the port.  You or a family member can get special training and directions for giving medicine and taking care of the port at home. SEEK IMMEDIATE MEDICAL CARE IF:   Your port does not flush or you are unable to get a blood return.  New drainage or pus is coming from the incision.  A bad smell is coming from the incision site.  You develop swelling or increased redness at the incision site.  You develop increased swelling or pain at the port site.  You develop swelling or pain in the surrounding skin near the port.  You have an oral temperature above 102 F (38.9 C), not controlled by medicine. MAKE SURE YOU:   Understand these instructions.  Will watch your condition.  Will get help right away if you are not doing well or get worse. Document Released: 01/02/2005 Document Revised: 03/27/2011 Document Reviewed: 03/26/2008 [facility] Patient Information 2014 [place],  [place].

## 2012-09-12 NOTE — Telephone Encounter (Signed)
I spoke with receptionist at Dr. Eliberto Ivory office regarding adding a port consult to the post op recheck on Sept 2 visit. She noted it in the appt notes.

## 2012-09-12 NOTE — Progress Notes (Signed)
Patient History and Physical   Tracy Neal [id number] [birth year] 57 y.o. 09/12/2012  Referring MD: Rosilyn Mings.  Chief Complaint: Right Breast cancer.   HPI:  I have reviewed records in the Epic electronic health care system. Tracy Neal is a 57 year old postmenopausal woman who confirms to me that she felt a right breast lump in December of 2013.  Reportedly she had mammogram 06/05/2012 at [facility] department and that this had shown a 2.8 cm spiculated mass in the middle third of the right breast.  Apparently this mass was palpable close to the nipple.  On 06/11/2012 she underwent core biopsy at 12:00 position of the right breast.  Pathology report showed invasive ductal carcinoma which was ER +100% PR negative and HER-2/neu negative.  Ki-67 was 18%.  On 07/15/2012 patient underwent lumpectomy with axillary lymph node biopsy, pathology report is as follows; 1. Breast, lumpectomy, Right - INVASIVE DUCTAL CARCINOMA, 3.2 CM. - MARGINS NOT INVOLVED. - CLOSEST MARGIN SUPERIOR AT 0.1 CM. 2. Lymph node, sentinel, biopsy, Right axilla #1 - ONE BENIGN LYMPH NODE (0/1). 3. Lymph node, sentinel, biopsy, Right axilla #2 - ONE BENIGN LYMPH NODE (0/1). Pathologic stage was; pT2, pN0, MX   Subsequently patient had Oncotype DX genomic showing that the patient's recurrence score of 28 with an average rate of distant recurrence of 19% . As a result of this patient is being considered for adjuvant chemotherapy and she had chosen this facility because of proximity to her home.  She feels well and tells me that she has no new problems.  Her wounds are nicely healed.  She has chronic back pain and shortness of breath which are unchanged.  There is no reported weight loss or bone pain.  Of note is that the patient has had a lesion between the nose and the medical lower part of the right eye which is suspected to be basal cell for several years.  She has not had medical treatment reportedly  due to her Medicaid insurance and not being able to find the dermatologist that would take her Medicaid.   PMH: Past Medical History  Diagnosis Date  . COPD (chronic obstructive pulmonary disease)   . Breast cancer   . Depression   . Umbilical hernia   . COPD, moderate   . Basal cell carcinoma     not biopsy confirmed; Right bridge of nose    Past Surgical History  Procedure Laterality Date  . Cholecystectomy    . Tubal ligation    . Breast lumpectomy Right 07/15/2012    Procedure: BREAST LUMPECTOMY WITH AXILLARY LYMPH NODE BIOPSY;  Surgeon: Shelly Rubenstein, MD;  Location: [facility] OR;  Service: General;  Laterality: Right;  Nuclear medicine injection 9:30     Allergies: Allergies  Allergen Reactions  . Percocet [Oxycodone-Acetaminophen] Itching    And nausea    Medications: Current outpatient prescriptions:acetaminophen (TYLENOL) 500 MG tablet, Take 500 mg by mouth every 6 (six) hours as needed for pain., Disp: , Rfl: ;  albuterol-ipratropium (COMBIVENT) 18-103 MCG/ACT inhaler, Inhale 1 puff into the lungs 4 (four) times daily., Disp: 14.7 g, Rfl: 0;  famotidine-calcium carbonate-magnesium hydroxide (PEPCID COMPLETE) 10-800-165 MG CHEW, Chew 1 tablet by mouth 2 (two) times daily as needed. For heartburn, Disp: , Rfl:  traMADol (ULTRAM) 50 MG tablet, Take 1 tablet (50 mg total) by mouth every 6 (six) hours as needed for pain., Disp: 60 tablet, Rfl: 0   Social History:   reports that she has been  smoking Cigarettes.  She has a 40 pack-year smoking history. She has never used smokeless tobacco. She reports that she does not drink alcohol or use illicit drugs. Smokes 1ppd. Lives alone. Single. Son lives 10 minutes drive away. Friends live close [employer]. Worked [employer] pl[employer]. Worked last about 10 years ago.  Family History: Family History  Problem Relation Age of Onset  . Diabetes Mother   . Stroke Mother   . Multiple sclerosis Mother    . Cancer Paternal Aunt    Gyncological History: Menarche [age], menopause late 30's, Gravida 2 +2 2 alive. Age of 1st pregnancy 61. Hx of OCP use many years ago about 2 years.   Review of Systems: 14 point review of system is as in the history above otherwise negative.   Physical Exam: Blood pressure 123/76, pulse 73, temperature 97.5 F (36.4 C), temperature source Oral, resp. rate 20, height 5\' 8"  (1.727 m), weight 244 lb 8 oz (110.904 kg). GENERAL: No distress,severely obese. SKIN:  No rashes or significant lesions , no ecchymosis or petechia or rash. HEAD: Normocephalic, No masses, lesions, tenderness or abnormalities  EYES: Conjunctiva are pink , non-injected and no jaundice.  ENT: External ears normal ,lips, buccal mucosa, and tongue normal and mucous membranes are moist . LYMPH: No palpable lymphadenopathy,  In the neck supraclavicular area or axilla. BREAST:well-healed lumpectomy scar in the right breast and axillary scar, slight erythema around the scar is but no evidence of infection or discharge.  No palpable masses in both breasts. LUNGS: decreased breath sounds bilaterally , no crackles or wheezes HEART: regular rate & rhythm, no murmurs, no gallops, S1 normal and S2 normal  ABDOMEN: Abdomen soft, non-tender, normal bowel sounds, no masses or organomegaly and no hepatosplenomegaly palpable.  EXTREMITIES: No edema, no skin discoloration or tenderness. NEURO: Alert & oriented , no focal motor deficits.     Lab Results: Lab Results  Component Value Date   WBC 8.4 08/14/2012   HGB 15.5 08/14/2012   HCT 45.9 08/14/2012   MCV 88.5 08/14/2012   PLT 267 08/14/2012     Chemistry      Component Value Date/Time   NA 139 08/14/2012 1317   NA 138 07/09/2012 1612   K 3.9 08/14/2012 1317   K 4.6 07/09/2012 1612   CL 101 07/09/2012 1612   CL 106 06/19/2012 0830   CO2 24 08/14/2012 1317   CO2 28 07/09/2012 1612   BUN 14.9 08/14/2012 1317   BUN 13 07/09/2012 1612   CREATININE 1.0  08/14/2012 1317   CREATININE 1.01 07/09/2012 1612      Component Value Date/Time   CALCIUM 9.7 08/14/2012 1317   CALCIUM 9.6 07/09/2012 1612   ALKPHOS 91 08/14/2012 1317   AST 47* 08/14/2012 1317   ALT 51 08/14/2012 1317   BILITOT 0.35 08/14/2012 1317         Radiological Studies: Dg Lumbar Spine Complete  08/14/2012   *RADIOLOGY REPORT*  Clinical Data: Lower back pain.  LUMBAR SPINE - COMPLETE 4+ VIEW  Comparison: None.  Findings: No fracture or spondylolisthesis is noted.  Moderate degenerative disc disease is noted at L4-5.  Mild anterior osteophyte formation is noted at L1-2, L2-3 and L3-4.  Posterior facet joints appear normal.  IMPRESSION: Moderate degenerative disc disease is noted at L4-5.  No acute abnormality seen in lumbar spine.   Original Report Authenticated By: Lupita Raider.,  M.D.   Dg Shoulder Left  Impression: Patient has  T2, N0, M0 invasive ductal carcinoma of the right breast ER positive  PR- and Her-2- neu not amplified , status post surgical resection with clear margins and high intermediate Oncotype DX score of 28.  Given her relatively young age I think patient stand to benefit from adjuvant chemotherapy in addition to hormonal therapy.  Also given lumpectomy status, breast irradiation will be necessary to decrease local recurrence. Based and on the adjuvant online model her absolute 10 year mortality benefits from chemotherapy his approximately 9% with hormonal therapy benefit of 6.4% and combined benefit of 13%. Patient is also a known heavy smoker.  She has a right  facial lesion which is suspicious for basal cell carcinoma.  Back pain is most likely from DJD involving the spine is noted on recent x-ray. . Recommendations: 1.  Given her relatively low-risk cancer I recommend 4 cycles of adjuvant TC chemotherapy to be administered every 3 weeks. A handout on day to begin next week but the patient prefers to start in 2 weeks time.  She was made aware that continued  delay to begin adjuvant chemotherapy may increase her risk of recurrence and the potentially inferior outcome. 2.  Patient has an upcoming appointment to see radiation oncology. I anticipate that radiation would occur once chemotherapy is completed. 3.  Given the relatively short course of chemotherapy , PICC line would be a good choice. However I will leave this choice of  PICC line versus Mediport to the discretion of the patient. 4. She'll return to clinic one week after chemotherapy to evaluate for toxicity. 5. Once chemotherapy is completed,  patient can begin adjuvant aromatase inhibitor therapy which I would recommend for 5 years.  She would need a baseline DEXA scan at that time. 6.  I counseled on smoking cessation. 7. I advised her to seek help for her facial lesion at any of the local tertiary institutions where they may have an resident's/Fellows clinic that could take her Medicaid insurance. I made her aware that this can be locally invasive with potential destruction of vital organs such as the right eye and her nose.  All questions were satisfactorily answered.She knows to call if she has any concern.  I spent 50% of the time was spent counseling the patient face to face. The total time spent in the appointment was 60 minutes.   Sherral Hammers, MD FACP. Hematology/Oncology.     Marland Kitchen

## 2012-09-13 ENCOUNTER — Telehealth (HOSPITAL_COMMUNITY): Payer: Self-pay | Admitting: Oncology

## 2012-09-13 ENCOUNTER — Encounter: Payer: Self-pay | Admitting: *Deleted

## 2012-09-13 ENCOUNTER — Telehealth: Payer: Self-pay | Admitting: *Deleted

## 2012-09-13 ENCOUNTER — Other Ambulatory Visit (HOSPITAL_COMMUNITY): Payer: Self-pay | Admitting: Oncology

## 2012-09-13 DIAGNOSIS — J449 Chronic obstructive pulmonary disease, unspecified: Secondary | ICD-10-CM

## 2012-09-13 MED ORDER — IPRATROPIUM-ALBUTEROL 18-103 MCG/ACT IN AERO: 1.0000 | INHALATION_SPRAY | Freq: Four times a day (QID) | RESPIRATORY_TRACT | Status: DC

## 2012-09-13 NOTE — Telephone Encounter (Signed)
Patient called to request that an order be phoned to the pharmacy for her inhaler which she reports is not working this morning.  I verified with patient that the inhaler is Combivent as listed in her current medication list.  Patient reports that she had her consult at the [facility] yesterday and that she will be having a port placed and will have 4 cycles of chemotherapy.  Patient denied any questions at this time.  Patient instructed to call when needed.  I phoned the Cancer Center at [facility] and spoke with Marcelino Duster who will consult Dr. Lanny Cramp about ordering patient's inhaler.

## 2012-09-13 NOTE — Telephone Encounter (Signed)
Patient had a scheduled appointment today at [facility].  Patient did not know who the appointment was with and there was no documentation in her chart about an appointment.  When patient went to the appointment, she discovered it was with the Medical Oncologist.  Patient called me because her plan had been to receive her chemotherapy at [facility] and she had been there for a consult 09/12/12.  She expressed confusion because she liked [facility] and her treatment plan but [facility] is very close to her home and would eliminate transportation concerns.  Following her appointment, I talked with her and we discussed that she needs to begin her chemotherapy treatment as soon as possible and that it is important that she be comfortable with her treatment plan and medical team and that she needs to decide where she is going to be the most comfortable.  Patient has an appointment with Dr. Magnus Ivan 09/17/12 to discuss port placement and she said she would decide after seeing him. We agreed that we will talk after her appointment and she will have decided where she will be receiving her treatments.  We will then discuss next steps.

## 2012-09-13 NOTE — Progress Notes (Signed)
Faxed records to [facility] where pt is waiting to be seen.  Informed Beth w/ [facility] that I was faxing.

## 2012-09-17 ENCOUNTER — Ambulatory Visit (INDEPENDENT_AMBULATORY_CARE_PROVIDER_SITE_OTHER): Payer: Medicaid Other | Admitting: Surgery

## 2012-09-17 ENCOUNTER — Telehealth: Payer: Self-pay | Admitting: *Deleted

## 2012-09-17 ENCOUNTER — Telehealth (HOSPITAL_COMMUNITY): Payer: Self-pay | Admitting: *Deleted

## 2012-09-17 ENCOUNTER — Encounter (INDEPENDENT_AMBULATORY_CARE_PROVIDER_SITE_OTHER): Payer: Self-pay | Admitting: Surgery

## 2012-09-17 ENCOUNTER — Encounter (INDEPENDENT_AMBULATORY_CARE_PROVIDER_SITE_OTHER): Payer: Medicaid Other | Admitting: Surgery

## 2012-09-17 VITALS — BP 132/84 | HR 76 | Resp 16 | Ht 68.0 in | Wt 246.0 lb

## 2012-09-17 DIAGNOSIS — Z09 Encounter for follow-up examination after completed treatment for conditions other than malignant neoplasm: Secondary | ICD-10-CM

## 2012-09-17 MED ORDER — DEXAMETHASONE 4 MG PO TABS: ORAL_TABLET | ORAL | Status: DC

## 2012-09-17 MED ORDER — ONDANSETRON HCL 8 MG PO TABS: ORAL_TABLET | ORAL | Status: DC

## 2012-09-17 MED ORDER — LIDOCAINE-PRILOCAINE 2.5-2.5 % EX CREA: TOPICAL_CREAM | CUTANEOUS | Status: DC

## 2012-09-17 NOTE — Telephone Encounter (Signed)
Patient notified of appointment date and time with [facility] given to me by Marylene Land. Sept 23 @ 2. Patient asked "why so late". I gave her their # and asked her to call them regarding getter an appt sooner. She said ok and that she was going to do that.

## 2012-09-17 NOTE — Addendum Note (Signed)
Addended by: Oda Kilts on: 09/17/2012 02:39 PM   Modules accepted: Orders

## 2012-09-17 NOTE — Telephone Encounter (Signed)
Patient called to say she has decided to have her chemotherapy treatments at [facility] due to her concerns regarding transportation.  Patient has also decided to seek port placement at [facility].  She and I discussed how decreasing her stress and anxiety is also important during her treatment and not having to be concerned about how she will be able to get to her treatments may be helpful.  She agreed.  We will discuss next steps after her f/u visit with Dr. Magnus Ivan today.

## 2012-09-17 NOTE — Telephone Encounter (Signed)
Patient states that she is going to have her care at [facility]. She only lives 5 minutes from there. I have called and cancelled our medications that we called in for her to [facility] which included Dex, Zofran, EMLA cream. I spoke with [place] at [facility]. I also made a phone call to [facility] and let them know that patient did in fact want to go to them for her chemo treatment.

## 2012-09-17 NOTE — Patient Instructions (Addendum)
[facility]   CHEMOTHERAPY INSTRUCTIONS  Cytoxan is a chemotherapy drug. Cytoxan can cause nausea/vomiting and hair loss. It can also cause hemorrhagic cystitis (bloody urine) because the chemo irritates your bladder. You need to push fluids when on this chemo, preferably water(64 oz a day). Do not hold your urine. Urinate before you go to bed and if you wake up during the night go to the bathroom.   Taxotere - bone marrow suppression (lowers white blood cells (fight infection), lowers red blood cells (make up your blood), lowers platelets (help blood to clot). This chemo can cause fluid retention. You will be responsible for taking a steroid called Dexamethasone at home prior to and after Taxotere. This steroid will keep you from having fluid retention. Take it whether you think you need it or not. Can cause hair loss, skin/nail changes (darkening of the nail beds, pain where the nail bed meets the skin, loosening of the nail beds, dry skin, palms of hands and soles of feet may darken or get sensitive, nausea/vomiting, paresthesia (numbness or tingling) in extremities - we need to know if this develops, mucositis (inflammation of any mucosal membrane - the mouth, throat), mouth sores, neurotoxicity (loss of memory, headaches, trouble sleeping, etc.), can also cause excessive tear production. Please let us know if any side effect develops.  Neulasta - this medication is not chemo but being given because you have had chemo. It is usually given 20-24 hours after the completion of chemotherapy. This medication works by boosting your bone marrow's supply of white blood cells. White blood cells  protect our bodies against infection. The medication is given in the form of a subcutaneous injection. It is given in the fatty tissue of your abdomen. It is a short needle. The major side effect of this medication is bone or muscle pain. The drug of choice to relieve or lessen the pain  is Aleve or Ibuprofen. If a physician has ever told you not to take Aleve or Ibuprofen - then don't take it. You should then take Tylenol/acetaminophen instead. Take either medication as the bottle directs you to.  The level of pain you experience as a result of this injection can range from none, to mild or moderate, or severe. Please let us know if you develop moderate or severe bone pain.    Before chemo you will be given pre-meds through your IV called Dexamethasone/Zofran. A common side effect of Dexamethasone is feeling energized, generally feeling better, trouble sleeping, irritability, moody, etc. Dexamethasone can also make you flushed in the face, neck, shoulders. This flushing is often accompanied by feeling warm in those areas that you are reddened/flushed. This will pass in a couple of days.   POTENTIAL SIDE EFFECTS OF TREATMENT: Increased Susceptibility to Infection, Nausea/Vomiting, Constipation/Diarrhea, Hair Thinning/Loss, Changes in Character of Skin and Nails (brittleness, dryness,etc.), Pigment Changes (darkening of nail beds, palms of hands, soles of feet, etc.), Bone Marrow Suppression, Abdominal Cramping, Blood in Urine, Painful Urination, Sun Sensitivity and Mouth Sores   SELF IMAGE NEEDS AND REFERRALS MADE: Obtain hair accessories as soon as possible (wigs, scarves, turbans,caps,etc.) and Referral to Look Good, Feel Better consultant   EDUCATIONAL MATERIALS GIVEN AND REVIEWED: Chemotherapy and You and Specific Instructions Sheets: Taxotere, Cytoxan, Neulasta, Dexamethasone, Zofran, EMLA cream, Port-a-cath   SELF CARE ACTIVITIES WHILE ON CHEMOTHERAPY: Increase your fluid intake 48 hours prior to treatment and drink at least 2 quarts per day after treatment., No alcohol intake., No aspirin  or other medications unless approved by your oncologist., Eat foods that are light and easy to digest., Eat foods at cold or room temperature., No fried, fatty, or spicy foods immediately  before or after treatment., Have teeth cleaned professionally before starting treatment. Keep dentures and partial plates clean., Use soft toothbrush and do not use mouthwashes that contain alcohol. Biotene is a good mouthwash that is available at most pharmacies or may be ordered by calling [phone]., Use warm salt water gargles (1 teaspoon salt per 1 quart warm water) before and after meals and at bedtime. Or you may rinse with 2 tablespoons of three -percent hydrogen peroxide mixed in eight ounces of water., Always use sunscreen with SPF (Sun Protection Factor) of 30 or higher., Use your nausea medication as directed to prevent nausea., Use your stool softener or laxative as directed to prevent constipation. and Use your anti-diarrheal medication as directed to stop diarrhea.  Please wash your hands for at least 30 seconds using warm soapy water. Handwashing is the #1 way to prevent the spread of germs. Stay away from sick people or people who are getting over a cold. If you develop respiratory systems such as green/yellow mucus production or productive cough or persistent cough let us know and we will see if you need an antibiotic. It is a good idea to keep a pair of gloves on when going into grocery stores/[employer] to decrease your risk of coming into contact with germs on the carts, etc. Carry alcohol hand gel with you at all times and use it frequently if out in public. All foods need to be cooked thoroughly. No raw foods. No medium or undercooked meats, eggs. If your food is cooked medium well, it does not need to be hot pink or saturated with bloody liquid at all. Vegetables and fruits need to be washed/rinsed under the faucet with a dish detergent before being consumed. You can eat raw fruits and vegetables unless we tell you otherwise but it would be best if you cooked them or bought frozen. Do not eat off of salad bars or hot bars unless you really trust the cleanliness of the restaurant. If you  need dental work, please let us know before you go for your appointment so that we can coordinate the best possible time for you in regards to your chemo regimen. You need to also let your dentist know that you are actively taking chemo. We may need to do labs prior to your dental appointment. We also want your bowels moving at least every other day. If this is not happening, we need to know so that we can get you on a bowel regimen to help you go.       MEDICATIONS: You have been given prescriptions for the following medications:  Dexamethasone 4mg  tablet. The day before Taxotere chemo, take 2 tablets in the am and 2 tablets in the pm. Then starting the day after chemo, take 2 tablets in the am and 2 tablets in the pm for 3 days. Then Stop. Take with food.   Zofran/Ondansetron 8mg  tablet. Starting the day after chemo, take 1 tab in the am and 1 tab in the pm for 3 days. Then may take 1 tablet every 8 hours if needed for nausea/vomiting. (this can constipate you!)  EMLA cream. Apply a quarter size amount to port site 1 hour prior to chemo. Do not rub in. Cover with plastic wrap.   Over-the-Counter Meds:  Colace - this is  a stool softener. Take 100mg  capsule 2-6 times a day as needed. If you have to take more than 6 capsules of Colace a day call the Cancer Center.  Senna - this is a mild laxative used to treat mild constipation. May take 2 tabs by mouth daily or up to twice a day as needed for mild constipation.  Milk of Magnesia - this is a laxative used to treat moderate to severe constipation. May take 2-4 tablespoons every 8 hours as needed. May increase to 8 tablespoons x 1 dose and if no bowel movement call the Cancer Center.  Imodium - this is for diarrhea. Take 2 tabs after 1st loose stool and then 1 tab after each loose stool until you go a total of 12 hours without a loose stool. Call Cancer Center if loose stools continue.   SYMPTOMS TO REPORT AS SOON AS POSSIBLE AFTER  TREATMENT:  FEVER GREATER THAN 100.5 F  CHILLS WITH OR WITHOUT FEVER  NAUSEA AND VOMITING THAT IS NOT CONTROLLED WITH YOUR NAUSEA MEDICATION  UNUSUAL SHORTNESS OF BREATH  UNUSUAL BRUISING OR BLEEDING  TENDERNESS IN MOUTH AND THROAT WITH OR WITHOUT PRESENCE OF ULCERS  URINARY PROBLEMS  BOWEL PROBLEMS  UNUSUAL RASH    Wear comfortable clothing and clothing appropriate for easy access to any Portacath or PICC line. Let us know if there is anything that we can do to make your therapy better!      I have been informed and understand all of the instructions given to me and have received a copy. I have been instructed to call the clinic [phone] or my family physician as soon as possible for continued medical care, if indicated. I do not have any more questions at this time but understand that I may call the Cancer Center or the Patient Navigator at [phone] during office hours should I have questions or need assistance in obtaining follow-up care.      _________________________________________      _______________     __________ Signature of Patient or Authorized Representative        Date                            Time      _________________________________________ Nurse's Signature      Docetaxel injection What is this medicine? DOCETAXEL (doe se TAX el) is a chemotherapy drug. It targets fast dividing cells, like cancer cells, and causes these cells to die. This medicine is used to treat many types of cancers like breast cancer, certain stomach cancers, head and neck cancer, lung cancer, and prostate cancer. This medicine may be used for other purposes; ask your health care provider or pharmacist if you have questions. What should I tell my health care provider before I take this medicine? They need to know if you have any of these conditions: -infection (especially a virus infection such as chickenpox, cold sores, or herpes) -liver disease -low blood  counts, like low white cell, platelet, or red cell counts -an unusual or allergic reaction to docetaxel, polysorbate 80, other chemotherapy agents, other medicines, foods, dyes, or preservatives -pregnant or trying to get pregnant -breast-feeding How should I use this medicine? This drug is given as an infusion into a vein. It is administered in a hospital or clinic by a specially trained health care professional. Talk to your pediatrician regarding the use of this medicine in children. Special care may be needed.  Overdosage: If you think you have taken too much of this medicine contact a poison control center or emergency room at once. NOTE: This medicine is only for you. Do not share this medicine with others. What if I miss a dose? It is important not to miss your dose. Call your doctor or health care professional if you are unable to keep an appointment. What may interact with this medicine? -cyclosporine -erythromycin -ketoconazole -medicines to increase blood counts like filgrastim, pegfilgrastim, sargramostim -vaccines Talk to your doctor or health care professional before taking any of these medicines: -acetaminophen -aspirin -ibuprofen -ketoprofen -naproxen This list may not describe all possible interactions. Give your health care provider a list of all the medicines, herbs, non-prescription drugs, or dietary supplements you use. Also tell them if you smoke, drink alcohol, or use illegal drugs. Some items may interact with your medicine. What should I watch for while using this medicine? Your condition will be monitored carefully while you are receiving this medicine. You will need important blood work done while you are taking this medicine. This drug may make you feel generally unwell. This is not uncommon, as chemotherapy can affect healthy cells as well as cancer cells. Report any side effects. Continue your course of treatment even though you feel ill unless your doctor tells  you to stop. In some cases, you may be given additional medicines to help with side effects. Follow all directions for their use. Call your doctor or health care professional for advice if you get a fever, chills or sore throat, or other symptoms of a cold or flu. Do not treat yourself. This drug decreases your body's ability to fight infections. Try to avoid being around people who are sick. This medicine may increase your risk to bruise or bleed. Call your doctor or health care professional if you notice any unusual bleeding. Be careful brushing and flossing your teeth or using a toothpick because you may get an infection or bleed more easily. If you have any dental work done, tell your dentist you are receiving this medicine. Avoid taking products that contain aspirin, acetaminophen, ibuprofen, naproxen, or ketoprofen unless instructed by your doctor. These medicines may hide a fever. Do not become pregnant while taking this medicine. Women should inform their doctor if they wish to become pregnant or think they might be pregnant. There is a potential for serious side effects to an unborn child. Talk to your health care professional or pharmacist for more information. Do not breast-feed an infant while taking this medicine. What side effects may I notice from receiving this medicine? Side effects that you should report to your doctor or health care professional as soon as possible: -allergic reactions like skin rash, itching or hives, swelling of the face, lips, or tongue -low blood counts - This drug may decrease the number of white blood cells, red blood cells and platelets. You may be at increased risk for infections and bleeding. -signs of infection - fever or chills, cough, sore throat, pain or difficulty passing urine -signs of decreased platelets or bleeding - bruising, pinpoint red spots on the skin, black, tarry stools, nosebleeds -signs of decreased red blood cells - unusually weak or  tired, fainting spells, lightheadedness -breathing problems -fast or irregular heartbeat -low blood pressure -mouth sores -nausea and vomiting -pain, swelling, redness or irritation at the injection site -pain, tingling, numbness in the hands or feet -swelling of the ankle, feet, hands -weight gain Side effects that usually do not require medical  attention (report to your prescriber or health care professional if they continue or are bothersome): -bone pain -complete hair loss including hair on your head, underarms, pubic hair, eyebrows, and eyelashes -diarrhea -excessive tearing -changes in the color of fingernails -loosening of the fingernails -nausea -muscle pain -red flush to skin -sweating -weak or tired This list may not describe all possible side effects. Call your doctor for medical advice about side effects. You may report side effects to FDA at 1-800-FDA-1088. Where should I keep my medicine? This drug is given in a hospital or clinic and will not be stored at home. NOTE: This sheet is a summary. It may not cover all possible information. If you have questions about this medicine, talk to your doctor, pharmacist, or health care provider.  2013, [employer]. (12/16/2007 11:52:10 AM) Cyclophosphamide injection What is this medicine? CYCLOPHOSPHAMIDE (sye kloe FOSS fa mide) is a chemotherapy drug. It slows the growth of cancer cells. This medicine is used to treat many types of cancer like lymphoma, myeloma, leukemia, breast cancer, and ovarian cancer, to name a few. It is also used to treat nephrotic syndrome in children. This medicine may be used for other purposes; ask your health care provider or pharmacist if you have questions. What should I tell my health care provider before I take this medicine? They need to know if you have any of these conditions: -blood disorders -history of other chemotherapy -history of radiation therapy -infection -kidney  disease -liver disease -tumors in the bone marrow -an unusual or allergic reaction to cyclophosphamide, other chemotherapy, other medicines, foods, dyes, or preservatives -pregnant or trying to get pregnant -breast-feeding How should I use this medicine? This drug is usually given as an injection into a vein or muscle or by infusion into a vein. It is administered in a hospital or clinic by a specially trained health care professional. Talk to your pediatrician regarding the use of this medicine in children. While this drug may be prescribed for selected conditions, precautions do apply. Overdosage: If you think you have taken too much of this medicine contact a poison control center or emergency room at once. NOTE: This medicine is only for you. Do not share this medicine with others. What if I miss a dose? It is important not to miss your dose. Call your doctor or health care professional if you are unable to keep an appointment. What may interact with this medicine? Do not take this medicine with any of the following medications: -mibefradil -nalidixic acid This medicine may also interact with the following medications: -doxorubicin -etanercept -medicines to increase blood counts like filgrastim, pegfilgrastim, sargramostim -medicines that block muscle or nerve pain -St. John's Wort -phenobarbital -succinylcholine chloride -trastuzumab -vaccines Talk to your doctor or health care professional before taking any of these medicines: -acetaminophen -aspirin -ibuprofen -ketoprofen -naproxen This list may not describe all possible interactions. Give your health care provider a list of all the medicines, herbs, non-prescription drugs, or dietary supplements you use. Also tell them if you smoke, drink alcohol, or use illegal drugs. Some items may interact with your medicine. What should I watch for while using this medicine? Visit your doctor for checks on your progress. This drug may  make you feel generally unwell. This is not uncommon, as chemotherapy can affect healthy cells as well as cancer cells. Report any side effects. Continue your course of treatment even though you feel ill unless your doctor tells you to stop. Drink water or other fluids as directed.  Urinate often, even at night. In some cases, you may be given additional medicines to help with side effects. Follow all directions for their use. Call your doctor or health care professional for advice if you get a fever, chills or sore throat, or other symptoms of a cold or flu. Do not treat yourself. This drug decreases your body's ability to fight infections. Try to avoid being around people who are sick. This medicine may increase your risk to bruise or bleed. Call your doctor or health care professional if you notice any unusual bleeding. Be careful brushing and flossing your teeth or using a toothpick because you may get an infection or bleed more easily. If you have any dental work done, tell your dentist you are receiving this medicine. Avoid taking products that contain aspirin, acetaminophen, ibuprofen, naproxen, or ketoprofen unless instructed by your doctor. These medicines may hide a fever. Do not become pregnant while taking this medicine. Women should inform their doctor if they wish to become pregnant or think they might be pregnant. There is a potential for serious side effects to an unborn child. Talk to your health care professional or pharmacist for more information. Do not breast-feed an infant while taking this medicine. Men should inform their doctor if they wish to father a child. This medicine may lower sperm counts. If you are going to have surgery, tell your doctor or health care professional that you have taken this medicine. What side effects may I notice from receiving this medicine? Side effects that you should report to your doctor or health care professional as soon as possible: -allergic  reactions like skin rash, itching or hives, swelling of the face, lips, or tongue -low blood counts - this medicine may decrease the number of white blood cells, red blood cells and platelets. You may be at increased risk for infections and bleeding. -signs of infection - fever or chills, cough, sore throat, pain or difficulty passing urine -signs of decreased platelets or bleeding - bruising, pinpoint red spots on the skin, black, tarry stools, blood in the urine -signs of decreased red blood cells - unusually weak or tired, fainting spells, lightheadedness -breathing problems -dark urine -mouth sores -pain, swelling, redness at site where injected -swelling of the ankles, feet, hands -trouble passing urine or change in the amount of urine -weight gain -yellowing of the eyes or skin Side effects that usually do not require medical attention (report to your doctor or health care professional if they continue or are bothersome): -changes in nail or skin color -diarrhea -hair loss -loss of appetite -missed menstrual periods -nausea, vomiting -stomach pain This list may not describe all possible side effects. Call your doctor for medical advice about side effects. You may report side effects to FDA at 1-800-FDA-1088. Where should I keep my medicine? This drug is given in a hospital or clinic and will not be stored at home. NOTE: This sheet is a summary. It may not cover all possible information. If you have questions about this medicine, talk to your doctor, pharmacist, or health care provider.  2013, [employer] Standard. (04/09/2007 2:32:25 PM) Pegfilgrastim injection What is this medicine? PEGFILGRASTIM (peg fil GRA stim) helps the body make more white blood cells. It is used to prevent infection in people with low amounts of white blood cells following cancer treatment. This medicine may be used for other purposes; ask your health care provider or pharmacist if you have  questions. What should I tell my health care provider  before I take this medicine? They need to know if you have any of these conditions: -sickle cell disease -an unusual or allergic reaction to pegfilgrastim, filgrastim, E.coli protein, other medicines, foods, dyes, or preservatives -pregnant or trying to get pregnant -breast-feeding How should I use this medicine? This medicine is for injection under the skin. It is usually given by a health care professional in a hospital or clinic setting. If you get this medicine at home, you will be taught how to prepare and give this medicine. Do not shake this medicine. Use exactly as directed. Take your medicine at regular intervals. Do not take your medicine more often than directed. It is important that you put your used needles and syringes in a special sharps container. Do not put them in a trash can. If you do not have a sharps container, call your pharmacist or healthcare provider to get one. Talk to your pediatrician regarding the use of this medicine in children. While this drug may be prescribed for children who weigh more than 45 kg for selected conditions, precautions do apply Overdosage: If you think you have taken too much of this medicine contact a poison control center or emergency room at once. NOTE: This medicine is only for you. Do not share this medicine with others. What if I miss a dose? If you miss a dose, take it as soon as you can. If it is almost time for your next dose, take only that dose. Do not take double or extra doses. What may interact with this medicine? -lithium -medicines for growth therapy This list may not describe all possible interactions. Give your health care provider a list of all the medicines, herbs, non-prescription drugs, or dietary supplements you use. Also tell them if you smoke, drink alcohol, or use illegal drugs. Some items may interact with your medicine. What should I watch for while using this  medicine? Visit your doctor for regular check ups. You will need important blood work done while you are taking this medicine. What side effects may I notice from receiving this medicine? Side effects that you should report to your doctor or health care professional as soon as possible: -allergic reactions like skin rash, itching or hives, swelling of the face, lips, or tongue -breathing problems -fever -pain, redness, or swelling where injected -shoulder pain -stomach or side pain Side effects that usually do not require medical attention (report to your doctor or health care professional if they continue or are bothersome): -aches, pains -headache -loss of appetite -nausea, vomiting -unusually tired This list may not describe all possible side effects. Call your doctor for medical advice about side effects. You may report side effects to FDA at 1-800-FDA-1088. Where should I keep my medicine? Keep out of the reach of children. Store in a refrigerator between 2 and 8 degrees C (36 and 46 degrees F). Do not freeze. Keep in carton to protect from light. Throw away this medicine if it is left out of the refrigerator for more than 48 hours. Throw away any unused medicine after the expiration date. NOTE: This sheet is a summary. It may not cover all possible information. If you have questions about this medicine, talk to your doctor, pharmacist, or health care provider.  2013, [employer]. (08/05/2007 3:41:44 PM) Implanted Port Instructions An implanted port is a central line that has a round shape and is placed under the skin. It is used for long-term IV (intravenous) access for:  Medicine.  Fluids.  Liquid  nutrition, such as TPN (total parenteral nutrition).  Blood samples. Ports can be placed:  In the chest area just below the collarbone (this is the most common place.)  In the arms.  In the belly (abdomen) area.  In the legs. PARTS OF THE PORT A port has 2 main  parts:  The reservoir. The reservoir is round, disc-shaped, and will be a small, raised area under your skin.  The reservoir is the part where a needle is inserted (accessed) to either give medicines or to draw blood.  The catheter. The catheter is a long, slender tube that extends from the reservoir. The catheter is placed into a large vein.  Medicine that is inserted into the reservoir goes into the catheter and then into the vein. INSERTION OF THE PORT  The port is surgically placed in either an operating room or in a procedural area (interventional radiology).  Medicine may be given to help you relax during the procedure.  The skin where the port will be inserted is numbed (local anesthetic).  1 or 2 small cuts (incisions) will be made in the skin to insert the port.  The port can be used after it has been inserted. INCISION SITE CARE  The incision site may have small adhesive strips on it. This helps keep the incision site closed. Sometimes, no adhesive strips are placed. Instead of adhesive strips, a special kind of surgical glue is used to keep the incision closed.  If adhesive strips were placed on the incision sites, do not take them off. They will fall off on their own.  The incision site may be sore for 1 to 2 days. Pain medicine can help.  Do not get the incision site wet. Bathe or shower as directed by your caregiver.  The incision site should heal in 5 to 7 days. A small scar may form after the incision has healed. ACCESSING THE PORT Special steps must be taken to access the port:  Before the port is accessed, a numbing cream can be placed on the skin. This helps numb the skin over the port site.  A sterile technique is used to access the port.  The port is accessed with a needle. Only "non-coring" port needles should be used to access the port. Once the port is accessed, a blood return should be checked. This helps ensure the port is in the vein and is not clogged  (clotted).  If your caregiver believes your port should remain accessed, a clear (transparent) bandage will be placed over the needle site. The bandage and needle will need to be changed every week or as directed by your caregiver.  Keep the bandage covering the needle clean and dry. Do not get it wet. Follow your caregiver's instructions on how to take a shower or bath when the port is accessed.  If your port does not need to stay accessed, no bandage is needed over the port. FLUSHING THE PORT Flushing the port keeps it from getting clogged. How often the port is flushed depends on:  If a constant infusion is running. If a constant infusion is running, the port may not need to be flushed.  If intermittent medicines are given.  If the port is not being used. For intermittent medicines:  The port will need to be flushed:  After medicines have been given.  After blood has been drawn.  As part of routine maintenance.  A port is normally flushed with:  Normal saline.  Heparin.  Follow your caregiver's advice on how often, how much, and the type of flush to use on your port. IMPORTANT PORT INFORMATION  Tell your caregiver if you are allergic to heparin.  After your port is placed, you will get a manufacturer's information card. The card has information about your port. Keep this card with you at all times.  There are many types of ports available. Know what kind of port you have.  In case of an emergency, it may be helpful to wear a medical alert bracelet. This can help alert health care workers that you have a port.  The port can stay in for as long as your caregiver believes it is necessary.  When it is time for the port to come out, surgery will be done to remove it. The surgery will be similar to how the port was put in.  If you are in the hospital or clinic:  Your port will be taken care of and flushed by a nurse.  If you are at home:  A home health care nurse may  give medicines and take care of the port.  You or a family member can get special training and directions for giving medicine and taking care of the port at home. SEEK IMMEDIATE MEDICAL CARE IF:   Your port does not flush or you are unable to get a blood return.  New drainage or pus is coming from the incision.  A bad smell is coming from the incision site.  You develop swelling or increased redness at the incision site.  You develop increased swelling or pain at the port site.  You develop swelling or pain in the surrounding skin near the port.  You have an oral temperature above 102 F (38.9 C), not controlled by medicine. MAKE SURE YOU:   Understand these instructions.  Will watch your condition.  Will get help right away if you are not doing well or get worse. Document Released: 01/02/2005 Document Revised: 03/27/2011 Document Reviewed: 03/26/2008 [facility] Patient Information 2014 [place], [place]. Ondansetron injection What is this medicine? ONDANSETRON (on DAN se tron) is used to treat nausea and vomiting caused by chemotherapy. It is also used to prevent or treat nausea and vomiting after surgery. This medicine may be used for other purposes; ask your health care provider or pharmacist if you have questions. What should I tell my health care provider before I take this medicine? They need to know if you have any of these conditions: -heart disease -history of irregular heartbeat -liver disease -low levels of magnesium or potassium in the blood -an unusual or allergic reaction to ondansetron, granisetron, other medicines, foods, dyes, or preservatives -pregnant or trying to get pregnant -breast-feeding How should I use this medicine? This medicine is for infusion into a vein. It is given by a health care professional in a hospital or clinic setting. Talk to your pediatrician regarding the use of this medicine in children. Special care may be needed. Overdosage: If you  think you have taken too much of this medicine contact a poison control center or emergency room at once. NOTE: This medicine is only for you. Do not share this medicine with others. What if I miss a dose? This does not apply. What may interact with this medicine? Do not take this medicine with any of the following medications: -apomorphine  This medicine may also interact with the following medications: -carbamazepine -phenytoin -rifampicin -tramadol This list may not describe all possible interactions. Give your health  care provider a list of all the medicines, herbs, non-prescription drugs, or dietary supplements you use. Also tell them if you smoke, drink alcohol, or use illegal drugs. Some items may interact with your medicine. What should I watch for while using this medicine? Your condition will be monitored carefully while you are receiving this medicine. What side effects may I notice from receiving this medicine? Side effects that you should report to your doctor or health care professional as soon as possible: -allergic reactions like skin rash, itching or hives, swelling of the face, lips, or tongue -breathing problems -dizziness -fast or irregular heartbeat -feeling faint or lightheaded, falls -fever and chills -swelling of the hands and feet -tightness in the chest Side effects that usually do not require medical attention (report to your doctor or health care professional if they continue or are bothersome): -constipation or diarrhea -headache This list may not describe all possible side effects. Call your doctor for medical advice about side effects. You may report side effects to FDA at 1-800-FDA-1088. Where should I keep my medicine? This drug is given in a hospital or clinic and will not be stored at home. NOTE: This sheet is a summary. It may not cover all possible information. If you have questions about this medicine, talk to your doctor, pharmacist, or health  care provider.  2013, [employer] Standard. (10/06/2009 11:37:58 AM) Ondansetron tablets What is this medicine? ONDANSETRON (on DAN se tron) is used to treat nausea and vomiting caused by chemotherapy. It is also used to prevent or treat nausea and vomiting after surgery. This medicine may be used for other purposes; ask your health care provider or pharmacist if you have questions. What should I tell my health care provider before I take this medicine? They need to know if you have any of these conditions: -heart disease -history of irregular heartbeat -liver disease -low levels of magnesium or potassium in the blood -an unusual or allergic reaction to ondansetron, granisetron, other medicines, foods, dyes, or preservatives -pregnant or trying to get pregnant -breast-feeding How should I use this medicine? Take this medicine by mouth with a glass of water. Follow the directions on your prescription label. Take your doses at regular intervals. Do not take your medicine more often than directed. Talk to your pediatrician regarding the use of this medicine in children. Special care may be needed. Overdosage: If you think you have taken too much of this medicine contact a poison control center or emergency room at once. NOTE: This medicine is only for you. Do not share this medicine with others. What if I miss a dose? If you miss a dose, take it as soon as you can. If it is almost time for your next dose, take only that dose. Do not take double or extra doses. What may interact with this medicine? Do not take this medicine with any of the following medications: -apomorphine  This medicine may also interact with the following medications: -carbamazepine -phenytoin -rifampicin -tramadol This list may not describe all possible interactions. Give your health care provider a list of all the medicines, herbs, non-prescription drugs, or dietary supplements you use. Also tell them if you smoke,  drink alcohol, or use illegal drugs. Some items may interact with your medicine. What should I watch for while using this medicine? Check with your doctor or health care professional right away if you have any sign of an allergic reaction. What side effects may I notice from receiving this medicine? Side effects that  you should report to your doctor or health care professional as soon as possible: -allergic reactions like skin rash, itching or hives, swelling of the face, lips or tongue -breathing problems -dizziness -fast or irregular heartbeat -feeling faint or lightheaded, falls -fever and chills -swelling of the hands or feet -tightness in the chest Side effects that usually do not require medical attention (report to your doctor or health care professional if they continue or are bothersome): -constipation or diarrhea -headache This list may not describe all possible side effects. Call your doctor for medical advice about side effects. You may report side effects to FDA at 1-800-FDA-1088. Where should I keep my medicine? Keep out of the reach of children. Store between 2 and 30 degrees C (36 and 86 degrees F). Throw away any unused medicine after the expiration date. NOTE: This sheet is a summary. It may not cover all possible information. If you have questions about this medicine, talk to your doctor, pharmacist, or health care provider.  2013, [employer] Standard. (10/08/2009 11:18:31 AM) Dexamethasone injection What is this medicine? DEXAMETHASONE (dex a METH a sone) is a corticosteroid. It is used to treat inflammation of the skin, joints, lungs, and other organs. Common conditions treated include asthma, allergies, and arthritis. It is also used for other conditions, like blood disorders and diseases of the adrenal glands. This medicine may be used for other purposes; ask your health care provider or pharmacist if you have questions. What should I tell my health care provider  before I take this medicine? They need to know if you have any of these conditions: -blood clotting problems -Cushing's syndrome -diabetes -glaucoma -heart problems or disease -high blood pressure -infection like herpes, measles, tuberculosis, or chickenpox -kidney disease -liver disease -mental problems -myasthenia gravis -osteoporosis -previous heart attack -seizures -stomach, ulcer or intestine disease including colitis and diverticulitis -thyroid problem -an unusual or allergic reaction to dexamethasone, corticosteroids, other medicines, lactose, foods, dyes, or preservatives -pregnant or trying to get pregnant -breast-feeding How should I use this medicine? This medicine is for injection into a muscle, joint, lesion, soft tissue, or vein. It is given by a health care professional in a hospital or clinic setting. Talk to your pediatrician regarding the use of this medicine in children. Special care may be needed. Overdosage: If you think you have taken too much of this medicine contact a poison control center or emergency room at once. NOTE: This medicine is only for you. Do not share this medicine with others. What if I miss a dose? This may not apply. If you are having a series of injections over a prolonged period, try not to miss an appointment. Call your doctor or health care professional to reschedule if you are unable to keep an appointment. What may interact with this medicine? Do not take this medicine with any of the following medications: -mifepristone, RU-486 -vaccines This medicine may also interact with the following medications: -amphotericin B -antibiotics like clarithromycin, erythromycin, and troleandomycin -aspirin and aspirin-like drugs -barbiturates like phenobarbital -carbamazepine -cholestyramine -cholinesterase inhibitors like donepezil, galantamine, rivastigmine, and tacrine -cyclosporine -digoxin -diuretics -ephedrine -female hormones, like  estrogens or progestins and birth control pills -indinavir -isoniazid -ketoconazole -medicines for diabetes -medicines that improve muscle tone or strength for conditions like myasthenia gravis -NSAIDs, medicines for pain and inflammation, like ibuprofen or naproxen -phenytoin -rifampin -thalidomide -warfarin This list may not describe all possible interactions. Give your health care provider a list of all the medicines, herbs, non-prescription drugs, or dietary supplements  you use. Also tell them if you smoke, drink alcohol, or use illegal drugs. Some items may interact with your medicine. What should I watch for while using this medicine? Your condition will be monitored carefully while you are receiving this medicine. If you are taking this medicine for a long time, carry an identification card with your name and address, the type and dose of your medicine, and your doctor's name and address. This medicine may increase your risk of getting an infection. Stay away from people who are sick. Tell your doctor or health care professional if you are around anyone with measles or chickenpox. Talk to your health care provider before you get any vaccines that you take this medicine. If you are going to have surgery, tell your doctor or health care professional that you have taken this medicine within the last twelve months. Ask your doctor or health care professional about your diet. You may need to lower the amount of salt you eat. The medicine can increase your blood sugar. If you are a diabetic check with your doctor if you need help adjusting the dose of your diabetic medicine. What side effects may I notice from receiving this medicine? Side effects that you should report to your doctor or health care professional as soon as possible: -allergic reactions like skin rash, itching or hives, swelling of the face, lips, or tongue -black or tarry stools -change in the amount of urine -changes in  vision -confusion, excitement, restlessness, a false sense of well-being -fever, sore throat, sneezing, cough, or other signs of infection, wounds that will not heal -hallucinations -increased thirst -mental depression, mood swings, mistaken feelings of self importance or of being mistreated -pain in hips, back, ribs, arms, shoulders, or legs -pain, redness, or irritation at the injection site -redness, blistering, peeling or loosening of the skin, including inside the mouth -rounding out of face -swelling of feet or lower legs -unusual bleeding or bruising -unusual tired or weak -wounds that do not heal Side effects that usually do not require medical attention (report to your doctor or health care professional if they continue or are bothersome): -diarrhea or constipation -change in taste -headache -nausea, vomiting -skin problems, acne, thin and shiny skin -touble sleeping -unusual growth of hair on the face or body -weight gain This list may not describe all possible side effects. Call your doctor for medical advice about side effects. You may report side effects to FDA at 1-800-FDA-1088. Where should I keep my medicine? This drug is given in a hospital or clinic and will not be stored at home. NOTE: This sheet is a summary. It may not cover all possible information. If you have questions about this medicine, talk to your doctor, pharmacist, or health care provider.  2013, [employer] Standard. (04/25/2007 2:04:12 PM) Dexamethasone tablets What is this medicine? DEXAMETHASONE (dex a METH a sone) is a corticosteroid. It is commonly used to treat inflammation of the skin, joints, lungs, and other organs. Common conditions treated include asthma, allergies, and arthritis. It is also used for other conditions, such as blood disorders and diseases of the adrenal glands. This medicine may be used for other purposes; ask your health care provider or pharmacist if you have  questions. What should I tell my health care provider before I take this medicine? They need to know if you have any of these conditions: -Cushing's syndrome -diabetes -glaucoma -heart problems or disease -high blood pressure -infection like herpes, measles, tuberculosis, or chickenpox -kidney disease -liver  disease -mental problems -myasthenia gravis -osteoporosis -previous heart attack -seizures -stomach, ulcer or intestine disease including colitis and diverticulitis -thyroid problem -an unusual or allergic reaction to dexamethasone, corticosteroids, other medicines, lactose, foods, dyes, or preservatives -pregnant or trying to get pregnant -breast-feeding How should I use this medicine? Take this medicine by mouth with a drink of water. Follow the directions on the prescription label. Take it with food or milk to avoid stomach upset. If you are taking this medicine once a day, take it in the morning. Do not take more medicine than you are told to take. Do not suddenly stop taking your medicine because you may develop a severe reaction. Your doctor will tell you how much medicine to take. If your doctor wants you to stop the medicine, the dose may be slowly lowered over time to avoid any side effects. Talk to your pediatrician regarding the use of this medicine in children. Special care may be needed. Patients over 26 years old may have a stronger reaction and need a smaller dose. Overdosage: If you think you have taken too much of this medicine contact a poison control center or emergency room at once. NOTE: This medicine is only for you. Do not share this medicine with others. What if I miss a dose? If you miss a dose, take it as soon as you can. If it is almost time for your next dose, talk to your doctor or health care professional. You may need to miss a dose or take an extra dose. Do not take double or extra doses without advice. What may interact with this medicine? Do not  take this medicine with any of the following medications: -mifepristone, RU-486 -vaccines This medicine may also interact with the following medications: -amphotericin B -antibiotics like clarithromycin, erythromycin, and troleandomycin -aspirin and aspirin-like drugs -barbiturates like phenobarbital -carbamazepine -cholestyramine -cholinesterase inhibitors like donepezil, galantamine, rivastigmine, and tacrine -cyclosporine -digoxin -diuretics -ephedrine -female hormones, like estrogens or progestins and birth control pills -indinavir -isoniazid -ketoconazole -medicines for diabetes -medicines that improve muscle tone or strength for conditions like myasthenia gravis -NSAIDs, medicines for pain and inflammation, like ibuprofen or naproxen -phenytoin -rifampin -thalidomide -warfarin This list may not describe all possible interactions. Give your health care provider a list of all the medicines, herbs, non-prescription drugs, or dietary supplements you use. Also tell them if you smoke, drink alcohol, or use illegal drugs. Some items may interact with your medicine. What should I watch for while using this medicine? Visit your doctor or health care professional for regular checks on your progress. If you are taking this medicine over a prolonged period, carry an identification card with your name and address, the type and dose of your medicine, and your doctor's name and address. This medicine may increase your risk of getting an infection. Stay away from people who are sick. Tell your doctor or health care professional if you are around anyone with measles or chickenpox. If you are going to have surgery, tell your doctor or health care professional that you have taken this medicine within the last twelve months. Ask your doctor or health care professional about your diet. You may need to lower the amount of salt you eat. The medicine can increase your blood sugar. If you are a diabetic  check with your doctor if you need help adjusting the dose of your diabetic medicine. What side effects may I notice from receiving this medicine? Side effects that you should report to your doctor or health  care professional as soon as possible: -allergic reactions like skin rash, itching or hives, swelling of the face, lips, or tongue -changes in vision -fever, sore throat, sneezing, cough, or other signs of infection, wounds that will not heal -increased thirst -mental depression, mood swings, mistaken feelings of self importance or of being mistreated -pain in hips, back, ribs, arms, shoulders, or legs -redness, blistering, peeling or loosening of the skin, including inside the mouth -trouble passing urine or change in the amount of urine -swelling of feet or lower legs -unusual bleeding or bruising Side effects that usually do not require medical attention (report to your doctor or health care professional if they continue or are bothersome): -headache -nausea, vomiting -skin problems, acne, thin and shiny skin -weight gain This list may not describe all possible side effects. Call your doctor for medical advice about side effects. You may report side effects to FDA at 1-800-FDA-1088. Where should I keep my medicine? Keep out of the reach of children. Store at room temperature between 20 and 25 degrees C (68 and 77 degrees F). Protect from light. Throw away any unused medicine after the expiration date. NOTE: This sheet is a summary. It may not cover all possible information. If you have questions about this medicine, talk to your doctor, pharmacist, or health care provider.  2013, [employer] Standard. (04/25/2007 2:02:13 PM) Lidocaine; Prilocaine cream What is this medicine? LIDOCAINE; PRILOCAINE (LYE doe kane; PRIL oh kane) is a topical anesthetic that causes loss of feeling in the skin and surrounding tissues. It is used to numb the skin before procedures or injections. This  medicine may be used for other purposes; ask your health care provider or pharmacist if you have questions. What should I tell my health care provider before I take this medicine? They need to know if you have any of these conditions: -glucose-6-phosphate deficiencies -heart disease -kidney or liver disease -methemoglobinemia -an unusual or allergic reaction to lidocaine, prilocaine, other medicines, foods, dyes, or preservatives -pregnant or trying to get pregnant -breast-feeding How should I use this medicine? This medicine is for external use only on the skin. Do not take by mouth. Follow the directions on the prescription label. Wash hands before and after use. Do not use more or leave in contact with the skin longer than directed. Do not apply to eyes or open wounds. It can cause irritation and blurred or temporary loss of vision. If this medicine comes in contact with your eyes, immediately rinse the eye with water. Do not touch or rub the eye. Contact your health care provider right away. Talk to your pediatrician regarding the use of this medicine in children. While this medicine may be prescribed for children for selected conditions, precautions do apply. Overdosage: If you think you have taken too much of this medicine contact a poison control center or emergency room at once. NOTE: This medicine is only for you. Do not share this medicine with others. What if I miss a dose? This medicine is usually only applied once prior to each procedure. It must be in contact with the skin for a period of time for it to work. If you applied this medicine later than directed, tell your health care professional before starting the procedure. What may interact with this medicine? -acetaminophen -chloroquine -dapsone -medicines to control heart rhythm -nitrates like nitroglycerin and nitroprusside -other ointments, creams, or sprays that may contain anesthetic  medicine -phenobarbital -phenytoin -quinine -sulfonamides like sulfacetamide, sulfamethoxazole, sulfasalazine and others This list may  not describe all possible interactions. Give your health care provider a list of all the medicines, herbs, non-prescription drugs, or dietary supplements you use. Also tell them if you smoke, drink alcohol, or use illegal drugs. Some items may interact with your medicine. What should I watch for while using this medicine? Be careful to avoid injury to the treated area while it is numb and you are not aware of pain. Avoid scratching, rubbing, or exposing the treated area to hot or cold temperatures until complete sensation has returned. The numb feeling will wear off a few hours after applying the cream. What side effects may I notice from receiving this medicine? Side effects that you should report to your doctor or health care professional as soon as possible: -blurred vision -chest pain -difficulty breathing -dizziness -drowsiness -fast or irregular heartbeat -skin rash or itching -swelling of your throat, lips, or face -trembling Side effects that usually do not require medical attention (report to your doctor or health care professional if they continue or are bothersome): -changes in ability to feel hot or cold -redness and swelling at the application site This list may not describe all possible side effects. Call your doctor for medical advice about side effects. You may report side effects to FDA at 1-800-FDA-1088. Where should I keep my medicine? Keep out of reach of children. Store at room temperature between 15 and 30 degrees C (59 and 86 degrees F). Keep container tightly closed. Throw away any unused medicine after the expiration date. NOTE: This sheet is a summary. It may not cover all possible information. If you have questions about this medicine, talk to your doctor, pharmacist, or health care provider.  2013, Elsevier/Gold Standard.  (07/08/2007 5:14:35 PM)

## 2012-09-17 NOTE — Progress Notes (Signed)
Subjective:     Patient ID: Tracy Neal, female   DOB: 1955-03-31, 57 y.o.   MRN: [id number]  HPI She is here today to discuss Port-A-Cath insertion. She actually was inserted in the [place] because of transportation issues. She is actually otherwise doing well  Review of Systems     Objective:   Physical Exam Her exam is benign    Assessment:     Patient with history of breast cancer     Plan:     I understand her transportation issues. I discussed the procedure with her in limited fashion. She will call for cancer coordinator and get a surgical appointment in [place] for Port-A-Cath insertion. I will see her back in 6 months

## 2012-09-19 ENCOUNTER — Telehealth: Payer: Self-pay | Admitting: *Deleted

## 2012-09-19 NOTE — Telephone Encounter (Signed)
Patient called to ask about contact information for Dr. Jorja Loa, Dermatology.  She had been given his name to call to see if he would accept her as a patient to evaluate and remove the growth she has on the right side of her nose, near her eye.  Patient also reported that she had successfully moved her care for chemotherapy and radiation therapy to [facility] and that she has an appointment with the medical oncologist tomorrow.  Patient had expressed that she wanted to continue with Dr. Darnelle Catalan as her oncologist and we discussed that when she completes her treatments, she can request that they refer her back to [facility] and Dr. Darnelle Catalan.  Patient asked about renewing her prescription for her pain medication and I instructed her to discuss her medications with her MD at tomorrow's visit since she has enough on hand.  Patient denied any other questions or needs at this time.  I encouraged her to call me as needed.

## 2012-09-20 ENCOUNTER — Telehealth: Payer: Self-pay | Admitting: *Deleted

## 2012-09-20 DIAGNOSIS — M899 Disorder of bone, unspecified: Secondary | ICD-10-CM

## 2012-09-20 DIAGNOSIS — M949 Disorder of cartilage, unspecified: Secondary | ICD-10-CM

## 2012-09-20 DIAGNOSIS — H019 Unspecified inflammation of eyelid: Secondary | ICD-10-CM

## 2012-09-20 DIAGNOSIS — C50919 Malignant neoplasm of unspecified site of unspecified female breast: Secondary | ICD-10-CM

## 2012-09-20 NOTE — Telephone Encounter (Signed)
Patient called to let me know that she completed her consultation with the Medical Oncologist at [facility] and is scheduled to begin her chemotherapy treatments next Wednesday, 09/25/12.  She is also scheduled for evaluation of her back pain on 09/26/12 and has an appointment to set up a referral with the Dermatology Clinic at [facility] to treat the lesion on her nose.  Patient reports that she is doing well and is happy to have her treatment plan scheduled.  I encouraged patient to call me for any questions or if she needs any assistance.

## 2012-09-23 ENCOUNTER — Encounter (HOSPITAL_COMMUNITY): Payer: Medicaid Other

## 2012-09-23 ENCOUNTER — Ambulatory Visit (HOSPITAL_COMMUNITY): Payer: Medicaid Other

## 2012-09-24 ENCOUNTER — Inpatient Hospital Stay (HOSPITAL_COMMUNITY): Payer: Medicaid Other

## 2012-09-25 ENCOUNTER — Ambulatory Visit (HOSPITAL_COMMUNITY): Payer: Medicaid Other

## 2012-09-25 DIAGNOSIS — Z5111 Encounter for antineoplastic chemotherapy: Secondary | ICD-10-CM

## 2012-09-25 DIAGNOSIS — C50919 Malignant neoplasm of unspecified site of unspecified female breast: Secondary | ICD-10-CM

## 2012-09-26 DIAGNOSIS — C50919 Malignant neoplasm of unspecified site of unspecified female breast: Secondary | ICD-10-CM

## 2012-09-27 ENCOUNTER — Encounter (HOSPITAL_COMMUNITY): Payer: Medicaid Other

## 2012-09-30 ENCOUNTER — Encounter (HOSPITAL_COMMUNITY): Payer: Medicaid Other

## 2012-09-30 ENCOUNTER — Encounter (INDEPENDENT_AMBULATORY_CARE_PROVIDER_SITE_OTHER): Payer: Medicaid Other | Admitting: Surgery

## 2012-09-30 DIAGNOSIS — E860 Dehydration: Secondary | ICD-10-CM

## 2012-09-30 DIAGNOSIS — R197 Diarrhea, unspecified: Secondary | ICD-10-CM

## 2012-09-30 DIAGNOSIS — H019 Unspecified inflammation of eyelid: Secondary | ICD-10-CM

## 2012-09-30 DIAGNOSIS — C50919 Malignant neoplasm of unspecified site of unspecified female breast: Secondary | ICD-10-CM

## 2012-10-04 ENCOUNTER — Encounter (HOSPITAL_COMMUNITY): Payer: Medicaid Other

## 2012-10-04 DIAGNOSIS — C50919 Malignant neoplasm of unspecified site of unspecified female breast: Secondary | ICD-10-CM

## 2012-10-04 DIAGNOSIS — R197 Diarrhea, unspecified: Secondary | ICD-10-CM

## 2012-10-04 DIAGNOSIS — E860 Dehydration: Secondary | ICD-10-CM

## 2012-10-04 DIAGNOSIS — B029 Zoster without complications: Secondary | ICD-10-CM

## 2012-10-07 ENCOUNTER — Encounter (HOSPITAL_COMMUNITY): Payer: Medicaid Other

## 2012-10-11 ENCOUNTER — Encounter (HOSPITAL_COMMUNITY): Payer: Medicaid Other

## 2012-10-16 DIAGNOSIS — Z5111 Encounter for antineoplastic chemotherapy: Secondary | ICD-10-CM

## 2012-10-16 DIAGNOSIS — C50919 Malignant neoplasm of unspecified site of unspecified female breast: Secondary | ICD-10-CM

## 2012-10-18 ENCOUNTER — Encounter (HOSPITAL_COMMUNITY): Payer: Medicaid Other

## 2012-10-18 DIAGNOSIS — D702 Other drug-induced agranulocytosis: Secondary | ICD-10-CM

## 2012-10-18 DIAGNOSIS — T451X5A Adverse effect of antineoplastic and immunosuppressive drugs, initial encounter: Secondary | ICD-10-CM

## 2012-11-05 DIAGNOSIS — R05 Cough: Secondary | ICD-10-CM

## 2012-11-05 DIAGNOSIS — D72829 Elevated white blood cell count, unspecified: Secondary | ICD-10-CM

## 2012-11-05 DIAGNOSIS — C50919 Malignant neoplasm of unspecified site of unspecified female breast: Secondary | ICD-10-CM

## 2012-11-06 DIAGNOSIS — C50919 Malignant neoplasm of unspecified site of unspecified female breast: Secondary | ICD-10-CM

## 2012-11-06 DIAGNOSIS — Z5111 Encounter for antineoplastic chemotherapy: Secondary | ICD-10-CM

## 2012-11-07 DIAGNOSIS — D702 Other drug-induced agranulocytosis: Secondary | ICD-10-CM

## 2012-11-13 DIAGNOSIS — C50919 Malignant neoplasm of unspecified site of unspecified female breast: Secondary | ICD-10-CM

## 2012-11-13 DIAGNOSIS — L0291 Cutaneous abscess, unspecified: Secondary | ICD-10-CM

## 2012-11-13 DIAGNOSIS — L0390 Cellulitis, unspecified: Secondary | ICD-10-CM

## 2012-11-25 ENCOUNTER — Telehealth: Payer: Self-pay | Admitting: *Deleted

## 2012-11-25 NOTE — Telephone Encounter (Signed)
I called patient to check in.  She is currently getting her chemotherapy treatments at [facility] in [place], [place].  Patient reports that she has her last treatment this Wednesday and will begin radiation therapy shortly thereafter.  She reports that she has tolerated chemo with minimal side effects.  She has lost her hair, has a bad taste in her mouth which affects her appetite and experiences arm and leg pain after her Neulasta injection.  She reports that her Oncologist felt it was better to pursue treatment for the skin cancer on her nose after she completes her chemo and radiation but the area has healed and is only a small indentation at this time.  She denied any questions or concerns.  I encouraged patient to call me if there is anything I can do for her.

## 2012-11-27 DIAGNOSIS — Z5111 Encounter for antineoplastic chemotherapy: Secondary | ICD-10-CM

## 2012-11-27 DIAGNOSIS — C50919 Malignant neoplasm of unspecified site of unspecified female breast: Secondary | ICD-10-CM

## 2012-11-28 DIAGNOSIS — C50919 Malignant neoplasm of unspecified site of unspecified female breast: Secondary | ICD-10-CM

## 2012-11-28 DIAGNOSIS — Z5189 Encounter for other specified aftercare: Secondary | ICD-10-CM

## 2012-12-03 DIAGNOSIS — G620 Drug-induced polyneuropathy: Secondary | ICD-10-CM

## 2012-12-03 DIAGNOSIS — R05 Cough: Secondary | ICD-10-CM

## 2012-12-03 DIAGNOSIS — C50919 Malignant neoplasm of unspecified site of unspecified female breast: Secondary | ICD-10-CM

## 2012-12-20 DIAGNOSIS — C50919 Malignant neoplasm of unspecified site of unspecified female breast: Secondary | ICD-10-CM | POA: Insufficient documentation

## 2012-12-31 DIAGNOSIS — D72829 Elevated white blood cell count, unspecified: Secondary | ICD-10-CM | POA: Insufficient documentation

## 2013-03-07 ENCOUNTER — Ambulatory Visit (INDEPENDENT_AMBULATORY_CARE_PROVIDER_SITE_OTHER): Payer: Medicaid Other | Admitting: Surgery

## 2013-04-14 ENCOUNTER — Telehealth (HOSPITAL_COMMUNITY): Payer: Self-pay | Admitting: *Deleted

## 2013-04-14 NOTE — Telephone Encounter (Signed)
Telephoned patient at home # and left message to return call to [facility] 

## 2013-04-15 ENCOUNTER — Encounter (INDEPENDENT_AMBULATORY_CARE_PROVIDER_SITE_OTHER): Payer: Self-pay | Admitting: Surgery

## 2013-04-15 ENCOUNTER — Ambulatory Visit (INDEPENDENT_AMBULATORY_CARE_PROVIDER_SITE_OTHER): Payer: Medicaid Other | Admitting: Surgery

## 2013-04-15 VITALS — BP 126/74 | HR 75 | Temp 98.4°F | Ht 67.0 in | Wt 226.8 lb

## 2013-04-15 DIAGNOSIS — Z853 Personal history of malignant neoplasm of breast: Secondary | ICD-10-CM

## 2013-04-15 MED ORDER — HYDROCODONE-ACETAMINOPHEN 7.5-325 MG PO TABS: 1.0000 | ORAL_TABLET | Freq: Four times a day (QID) | ORAL | Status: DC | PRN

## 2013-04-15 NOTE — Progress Notes (Signed)
Subjective:     Patient ID: Tracy Neal, female   DOB: [birth year], 58 y.o.   MRN: [id number]  HPI She is here for a 6 month followup regarding her right breast cancer. She is status post right breast lumpectomy, axillary sentinel lymph node biopsy, followed by radiation and chemotherapy. She has now finished her treatment. She currently has no complaints other than left shoulder pain  Review of Systems     Objective:   Physical Exam On exam, her breast is normal in appearance. Her incisions are well-healed. There is no axillary adenopathy and no palpable mass in the breast. There is no arm swelling    Assessment:     Patient stable with a history of right breast cancer     Plan:     I encouraged her to get her for orthopedic surgeon regarding left shoulder. I'll see her back here in one year

## 2013-04-25 ENCOUNTER — Encounter: Payer: Self-pay | Admitting: *Deleted

## 2013-04-25 NOTE — [facility] Oncology Navigator Note (Signed)
I called patient to check in.  Left a voice mail with contact information and request that she call back.

## 2013-04-25 NOTE — [facility] Oncology Navigator Note (Signed)
Patient returned my call.  She reports that she is doing well.  Her chemo and radiation treatments were completed and she is recovering.  She continues with back pain and has a referral to an orthopedic surgeon in [place].  She denies any questions or concerns at this time.  I encouraged her to call me for any needs.

## 2013-05-12 ENCOUNTER — Other Ambulatory Visit (INDEPENDENT_AMBULATORY_CARE_PROVIDER_SITE_OTHER): Payer: Self-pay | Admitting: Surgery

## 2013-05-12 DIAGNOSIS — Z9889 Other specified postprocedural states: Secondary | ICD-10-CM

## 2013-05-14 ENCOUNTER — Telehealth (INDEPENDENT_AMBULATORY_CARE_PROVIDER_SITE_OTHER): Payer: Self-pay | Admitting: General Surgery

## 2013-05-14 ENCOUNTER — Other Ambulatory Visit (INDEPENDENT_AMBULATORY_CARE_PROVIDER_SITE_OTHER): Payer: Self-pay | Admitting: Surgery

## 2013-05-14 ENCOUNTER — Encounter: Payer: Self-pay | Admitting: *Deleted

## 2013-05-14 MED ORDER — HYDROCODONE-ACETAMINOPHEN 7.5-325 MG PO TABS: 1.0000 | ORAL_TABLET | Freq: Four times a day (QID) | ORAL | Status: DC | PRN

## 2013-05-14 NOTE — Telephone Encounter (Signed)
Pt of Dr. Ninfa Linden called to ask for a med refill (Norco.)  She was referred by [facility] to see Dr. Trey Sailors, [facility] in [place] (not Dr. Jean Rosenthal, as suggested in our clinic.)  Her appt there is not until 06/10/13.  She called today to ask for a refill of her pain medication for the shoulder pain.  Please advise.

## 2013-05-14 NOTE — [facility] Oncology Navigator Note (Signed)
Patient called me to ask if her Medicaide runs out tomorrow.  I reviewed her chart and spoke to the SW and Estate manager/land agent and then called her back and instructed her to call [place] to verify.  Patient reports that she has already completed a new application.  Patient reports that she is doing well in relation to her breast cancer.  She continues to have shoulder pain and has an appointment scheduled with an orthopedic surgeon on 06/10/13.  Patient denied any other questions or concerns at this time.  I encouraged her to call me for any needs.

## 2013-05-14 NOTE — Telephone Encounter (Signed)
Called patient and told her that she has a Rx to come by the office to pick up at the front desk

## 2013-06-11 ENCOUNTER — Encounter (HOSPITAL_COMMUNITY): Payer: Medicaid Other

## 2013-06-11 ENCOUNTER — Encounter: Payer: Self-pay | Admitting: Oncology

## 2013-06-11 NOTE — Progress Notes (Signed)
Called and left the patient a message can't pay [facility] bill for it is due 06/23/13.

## 2013-07-31 ENCOUNTER — Other Ambulatory Visit (HOSPITAL_COMMUNITY): Payer: Self-pay | Admitting: *Deleted

## 2013-07-31 DIAGNOSIS — Z9889 Other specified postprocedural states: Secondary | ICD-10-CM

## 2013-08-12 ENCOUNTER — Ambulatory Visit (HOSPITAL_COMMUNITY)
Admission: RE | Admit: 2013-08-12 | Discharge: 2013-08-12 | Disposition: A | Discharge status: Home / Self Care | Payer: PRIVATE HEALTH INSURANCE | Source: Ambulatory Visit | Attending: *Deleted | Admitting: *Deleted

## 2013-08-12 ENCOUNTER — Encounter: Payer: Self-pay | Admitting: *Deleted

## 2013-08-12 DIAGNOSIS — Z9889 Other specified postprocedural states: Secondary | ICD-10-CM | POA: Insufficient documentation

## 2013-08-12 NOTE — [facility] Oncology Navigator Note (Signed)
I called patient to check in.  Patient completed her mammogram today and received a good report with instructions to repeat in 1 year.  Patient reports that she has not been able to renew her prescription for Arimidex due to no longer being covered by Medicaid.  She also has been unable to see an Orthopedic Surgeon about her chronic shoulder pain because of her Medicaid ending.  She has filed an appeal and has an appointment scheduled with the financial counselor at [facility] to assist her with obtaining Arimidex. Patient denied any questions or other needs at this time.  I encouraged her to call me as needed.

## 2013-11-17 ENCOUNTER — Encounter (INDEPENDENT_AMBULATORY_CARE_PROVIDER_SITE_OTHER): Payer: Self-pay | Admitting: Surgery

## 2013-12-10 ENCOUNTER — Encounter: Payer: Self-pay | Admitting: *Deleted

## 2013-12-10 NOTE — [facility] Oncology Navigator Note (Signed)
I called patient to check in.  Left a voicemail message with contact information and request that she return my call.

## 2013-12-16 ENCOUNTER — Encounter: Payer: Self-pay | Admitting: *Deleted

## 2013-12-16 NOTE — [facility] Oncology Navigator Note (Signed)
I called patient to check in.  She reports that she is doing OK except for a cold.  She denied any questions at this time.  She did have concerns about her lack of insurance.  She was denied Medicaid and was late submitting an appeal.  She also asked for the number to call the [employer] to get information on insurance.  I consulted the financial counselor and then called patient back and advised her to go to DSS and discuss her options regarding Medicaid and to request a case worker.  We also discussed insurance available through the Marketplace and I gave her the phone number.  I encouraged her to call me for any other questions or needs.

## 2014-02-20 ENCOUNTER — Other Ambulatory Visit: Payer: Self-pay | Admitting: *Deleted

## 2014-07-15 ENCOUNTER — Telehealth: Payer: Self-pay | Admitting: *Deleted

## 2014-07-15 NOTE — Telephone Encounter (Signed)
I called patient to check in.  Patient reports that she is doing well.  She continues to experience back pain and knee pain.  She is still working to get disability.  She denied any questions or concerns at this time.  I encouraged her to call me for any needs.

## 2014-10-22 DIAGNOSIS — M8580 Other specified disorders of bone density and structure, unspecified site: Secondary | ICD-10-CM | POA: Insufficient documentation

## 2014-10-22 DIAGNOSIS — G894 Chronic pain syndrome: Secondary | ICD-10-CM | POA: Insufficient documentation

## 2014-10-22 DIAGNOSIS — M47816 Spondylosis without myelopathy or radiculopathy, lumbar region: Secondary | ICD-10-CM | POA: Insufficient documentation

## 2014-10-22 DIAGNOSIS — Z720 Tobacco use: Secondary | ICD-10-CM | POA: Insufficient documentation

## 2014-11-03 ENCOUNTER — Other Ambulatory Visit (HOSPITAL_COMMUNITY): Payer: Self-pay | Admitting: Surgery

## 2014-11-03 DIAGNOSIS — Z9889 Other specified postprocedural states: Secondary | ICD-10-CM

## 2014-11-24 ENCOUNTER — Encounter (HOSPITAL_COMMUNITY): Payer: Medicaid Other

## 2014-12-08 ENCOUNTER — Ambulatory Visit (HOSPITAL_COMMUNITY)
Admission: RE | Admit: 2014-12-08 | Discharge: 2014-12-08 | Disposition: A | Discharge status: Home / Self Care | Payer: Medicaid Other | Source: Ambulatory Visit | Attending: Surgery | Admitting: Surgery

## 2014-12-08 DIAGNOSIS — Z9889 Other specified postprocedural states: Secondary | ICD-10-CM

## 2014-12-22 DIAGNOSIS — F1410 Cocaine abuse, uncomplicated: Secondary | ICD-10-CM | POA: Insufficient documentation

## 2015-01-08 ENCOUNTER — Telehealth: Payer: Self-pay | Admitting: *Deleted

## 2015-01-08 NOTE — Telephone Encounter (Signed)
  Oncology Nurse Navigator Documentation    Navigator Encounter Type: Telephone (01/08/15 1100)     Barriers/Navigation Needs: No barriers at this time (01/08/15 1100)   Interventions: None required (01/08/15 1100)  I called patient to check in.  Patient reports that she is doing well.  She has had her knee injected and it is feeling better.  She was unable to lay flat to have her back injected and continues to have some pain.  She denies any questions or concerns at this time.  I encouraged her to call me for any needs.         Time Spent with Patient: 15 (01/08/15 1100)

## 2015-01-22 ENCOUNTER — Telehealth: Payer: Self-pay

## 2015-01-22 NOTE — Telephone Encounter (Signed)
SUPREP SPLIT DOSING- CLEAR LIQUIDS WITH BREAKFAST.

## 2015-01-22 NOTE — Telephone Encounter (Signed)
Gastroenterology Pre-Procedure Review  Request Date: 01/21/2015 Requesting Physician: Jenetta Downer , PA  PATIENT REVIEW QUESTIONS: The patient responded to the following health history questions as indicated:    1. Diabetes Melitis: no 2. Joint replacements in the past 12 months: no 3. Major health problems in the past 3 months: no 4. Has an artificial valve or MVP: no 5. Has a defibrillator: no 6. Has been advised in past to take antibiotics in advance of a procedure like teeth cleaning: no 7. Family history of colon cancer: no  8. Alcohol Use: YES, but very seldom 9. History of sleep apnea: no     MEDICATIONS & ALLERGIES:    Patient reports the following regarding taking any blood thinners:   Plavix? no Aspirin? no Coumadin? no  Patient confirms/reports the following medications:  Current Outpatient Prescriptions  Medication Sig Dispense Refill  . albuterol (PROVENTIL) (2.5 MG/3ML) 0.083% nebulizer solution Take 2.5 mg by nebulization every 6 (six) hours as needed for wheezing or shortness of breath.    Marland Kitchen albuterol-ipratropium (COMBIVENT) 18-103 MCG/ACT inhaler Inhale 1 puff into the lungs 4 (four) times daily. 14.7 g 1  . amitriptyline (ELAVIL) 25 MG tablet Take 25 mg by mouth at bedtime.    Marland Kitchen anastrozole (ARIMIDEX) 1 MG tablet Take 1 mg by mouth daily.    . diphenhydrAMINE (BENADRYL) 25 MG tablet Take 25 mg by mouth every 6 (six) hours as needed. One tablet at bedtime prn    . ibuprofen (ADVIL,MOTRIN) 600 MG tablet Take 600 mg by mouth every 8 (eight) hours as needed.    . mometasone-formoterol (DULERA) 100-5 MCG/ACT AERO Inhale 2 puffs into the lungs 2 (two) times daily.    . pantoprazole (PROTONIX) 40 MG tablet Take 40 mg by mouth daily.    Marland Kitchen acetaminophen (TYLENOL) 500 MG tablet Take 500 mg by mouth every 6 (six) hours as needed for pain. Reported on 01/22/2015    . famotidine-calcium carbonate-magnesium hydroxide (PEPCID COMPLETE) 10-800-165 MG CHEW Chew 1 tablet by mouth 2  (two) times daily as needed. Reported on 01/22/2015    . HYDROcodone-acetaminophen (NORCO) 7.5-325 MG per tablet Take 1 tablet by mouth every 6 (six) hours as needed for moderate pain. (Patient not taking: Reported on 01/22/2015) 40 tablet 0  . HYDROcodone-acetaminophen (NORCO) 7.5-325 MG per tablet Take 1 tablet by mouth every 6 (six) hours as needed for moderate pain. Reported on 01/22/2015    . promethazine (PHENERGAN) 25 MG tablet Take 25 mg by mouth every 6 (six) hours as needed for nausea or vomiting. Reported on 01/22/2015    . traMADol (ULTRAM) 50 MG tablet Take 1 tablet (50 mg total) by mouth every 6 (six) hours as needed for pain. (Patient not taking: Reported on 01/22/2015) 60 tablet 0   No current facility-administered medications for this visit.    Patient confirms/reports the following allergies:  Allergies  Allergen Reactions  . Percocet [Oxycodone-Acetaminophen] Itching    And nausea    No orders of the defined types were placed in this encounter.    AUTHORIZATION INFORMATION Primary Insurance:   ID #:  Group #:  Pre-Cert / Auth required:  Pre-Cert / Auth #:   Secondary Insurance:  ID #:   Group #:  Pre-Cert / Auth required:  Pre-Cert / Auth #:   SCHEDULE INFORMATION: Procedure has been scheduled as follows:  Date:                   Time:   Location:  This Gastroenterology Pre-Precedure Review Form is being routed to the following provider(s): Barney Drain, MD

## 2015-01-27 NOTE — Telephone Encounter (Signed)
Tried to call pt to schedule the appt. Could not leave a VM.

## 2015-01-28 NOTE — Telephone Encounter (Signed)
Mailing a letter to call and schedule.

## 2015-02-04 NOTE — Telephone Encounter (Signed)
Patient called in stating she would like to schedule her tcs and she can be reached at [phone].  Routing to [employer]

## 2015-02-08 ENCOUNTER — Telehealth: Payer: Self-pay

## 2015-02-08 ENCOUNTER — Telehealth: Payer: Self-pay | Admitting: *Deleted

## 2015-02-08 NOTE — Telephone Encounter (Signed)
Pt called for DS. She said she had received a letter that DS was unable to reach her by phone and to call the office. Please call patient at [phone]

## 2015-02-08 NOTE — Telephone Encounter (Signed)
  Oncology Nurse Navigator Documentation  Navigator Location: [facility] On[facility] (02/08/15 1440) Navigator Encounter Type: Telephone (02/08/15 1440)  Called patient to follow up.  Patient reports that she is doing OK.  She is awaiting a call to schedule a routine colonoscopy.  We discussed the need to reschedule her mammogram.  Patient said she would call to schedule.  She denied any other needs at this time.  I encouraged her to call me for any assistance needed.           Treatment Phase: Other (Reminded patient to reschedule mammogram.) (02/08/15 1440) Barriers/Navigation Needs: Coordination of Care (02/08/15 1440)   Interventions:  (Discussed need to reschedule mammogram) (02/08/15 1440)            Acuity: Level 1 (02/08/15 1440) Acuity Level 1: Minimal follow up required (02/08/15 1440)       Time Spent with Patient: 15 (02/08/15 1440)

## 2015-02-10 NOTE — Telephone Encounter (Signed)
Pt called back and had to schedule for 03/16/2015 at 2:30 Pm.

## 2015-02-10 NOTE — Telephone Encounter (Signed)
Pt has been scheduled for 03/02/2015 at 2:30 pm with Dr. Oneida Alar.

## 2015-02-10 NOTE — Telephone Encounter (Signed)
Pt has been scheduled for 03/02/2015 at 2:30 pm with Dr. Oneida Alar. She could only come on a Tuesday.

## 2015-02-11 ENCOUNTER — Other Ambulatory Visit: Payer: Self-pay

## 2015-02-11 DIAGNOSIS — Z1211 Encounter for screening for malignant neoplasm of colon: Secondary | ICD-10-CM

## 2015-02-11 MED ORDER — NA SULFATE-K SULFATE-MG SULF 17.5-3.13-1.6 GM/177ML PO SOLN: 1.0000 | ORAL | Status: DC

## 2015-02-11 NOTE — Addendum Note (Signed)
Addended by: Everardo All on: 02/11/2015 01:41 PM   Modules accepted: Orders

## 2015-02-11 NOTE — Telephone Encounter (Signed)
Rx sent to the pharmacy and instructions mailed to pt.  

## 2015-03-09 ENCOUNTER — Telehealth: Payer: Self-pay | Admitting: Gastroenterology

## 2015-03-09 NOTE — Telephone Encounter (Signed)
Pt called for DS to let her know about her medications the dentist gave her and she is scheduled for a colonoscopy on 2/28 with SF. She is taking penicillin and a prescription for pain. Please call her back at [phone]

## 2015-03-09 NOTE — Telephone Encounter (Signed)
I called pt and she said she had some teeth pulled on 03/05/2015. The dentist gave her Penicillin 500 mg to to take one qid for 7 days. He also gave her Hydrocodone 5/325 mg to take  ( 12 tablets only). She just wanted to let us know since she is scheduled for colonoscopy at the end of the month.

## 2015-03-09 NOTE — Telephone Encounter (Signed)
[phone] ° °

## 2015-03-10 ENCOUNTER — Telehealth: Payer: Self-pay

## 2015-03-10 NOTE — Telephone Encounter (Signed)
PLEASE CALL PT. WE APPRECIATE HER LETTING us KNW ABOUT THE CHANGE IN HER MEDS. SHE MAY STILL HAVE HER TCS.

## 2015-03-10 NOTE — Telephone Encounter (Signed)
Please see note of 03/09/2015.

## 2015-03-10 NOTE — Telephone Encounter (Signed)
LMOM to call to update triage of any change in meds , etc. Prior to procedure next week.

## 2015-03-10 NOTE — Telephone Encounter (Signed)
Pt is aware.  

## 2015-03-11 ENCOUNTER — Other Ambulatory Visit (HOSPITAL_COMMUNITY): Payer: Self-pay | Admitting: *Deleted

## 2015-03-11 DIAGNOSIS — Z9889 Other specified postprocedural states: Secondary | ICD-10-CM

## 2015-03-11 DIAGNOSIS — Z1231 Encounter for screening mammogram for malignant neoplasm of breast: Secondary | ICD-10-CM

## 2015-03-15 ENCOUNTER — Telehealth: Payer: Self-pay

## 2015-03-15 NOTE — Telephone Encounter (Signed)
PA not required for the screening colonoscopy.

## 2015-03-16 ENCOUNTER — Encounter (HOSPITAL_COMMUNITY): Payer: Self-pay | Admitting: *Deleted

## 2015-03-16 ENCOUNTER — Ambulatory Visit (HOSPITAL_COMMUNITY)
Admission: RE | Admit: 2015-03-16 | Discharge: 2015-03-16 | Disposition: A | Discharge status: Home / Self Care | Payer: Medicaid Other | Source: Ambulatory Visit | Attending: Gastroenterology | Admitting: Gastroenterology

## 2015-03-16 ENCOUNTER — Encounter (HOSPITAL_COMMUNITY)
Admission: RE | Disposition: A | Discharge status: Home / Self Care | Payer: Self-pay | Source: Ambulatory Visit | Attending: Gastroenterology

## 2015-03-16 DIAGNOSIS — D125 Benign neoplasm of sigmoid colon: Secondary | ICD-10-CM | POA: Diagnosis not present

## 2015-03-16 DIAGNOSIS — D123 Benign neoplasm of transverse colon: Secondary | ICD-10-CM

## 2015-03-16 DIAGNOSIS — D124 Benign neoplasm of descending colon: Secondary | ICD-10-CM

## 2015-03-16 DIAGNOSIS — K219 Gastro-esophageal reflux disease without esophagitis: Secondary | ICD-10-CM | POA: Insufficient documentation

## 2015-03-16 DIAGNOSIS — Z1211 Encounter for screening for malignant neoplasm of colon: Secondary | ICD-10-CM | POA: Diagnosis present

## 2015-03-16 DIAGNOSIS — K648 Other hemorrhoids: Secondary | ICD-10-CM | POA: Diagnosis not present

## 2015-03-16 DIAGNOSIS — J449 Chronic obstructive pulmonary disease, unspecified: Secondary | ICD-10-CM | POA: Insufficient documentation

## 2015-03-16 DIAGNOSIS — F329 Major depressive disorder, single episode, unspecified: Secondary | ICD-10-CM | POA: Diagnosis not present

## 2015-03-16 DIAGNOSIS — Z79899 Other long term (current) drug therapy: Secondary | ICD-10-CM | POA: Insufficient documentation

## 2015-03-16 HISTORY — DX: Reserved for inherently not codable concepts without codable children: IMO0001

## 2015-03-16 HISTORY — PX: COLONOSCOPY: SHX5424

## 2015-03-16 HISTORY — DX: Gastro-esophageal reflux disease without esophagitis: K21.9

## 2015-03-16 SURGERY — COLONOSCOPY
Anesthesia: Moderate Sedation

## 2015-03-16 MED ORDER — PROMETHAZINE HCL 25 MG/ML IJ SOLN
INTRAMUSCULAR | Status: AC
  Administered 2015-03-16: 25 mg via INTRAVENOUS
  Filled 2015-03-16: qty 1

## 2015-03-16 MED ORDER — MEPERIDINE HCL 100 MG/ML IJ SOLN
INTRAMUSCULAR | Status: DC | PRN
  Administered 2015-03-16: 25 mg via INTRAVENOUS
  Administered 2015-03-16 (×2): 50 mg via INTRAVENOUS

## 2015-03-16 MED ORDER — MEPERIDINE HCL 100 MG/ML IJ SOLN
INTRAMUSCULAR | Status: AC
  Filled 2015-03-16: qty 2

## 2015-03-16 MED ORDER — SODIUM CHLORIDE 0.9% FLUSH
INTRAVENOUS | Status: AC
  Filled 2015-03-16: qty 10

## 2015-03-16 MED ORDER — MIDAZOLAM HCL 5 MG/5ML IJ SOLN
INTRAMUSCULAR | Status: AC
  Filled 2015-03-16: qty 10

## 2015-03-16 MED ORDER — PROMETHAZINE HCL 25 MG/ML IJ SOLN
25.0000 mg | Freq: Once | INTRAMUSCULAR | Status: AC
  Administered 2015-03-16: 25 mg via INTRAVENOUS

## 2015-03-16 MED ORDER — SODIUM CHLORIDE 0.9 % IV SOLN
INTRAVENOUS | Status: DC
  Administered 2015-03-16: 12:00:00 via INTRAVENOUS

## 2015-03-16 MED ORDER — MIDAZOLAM HCL 5 MG/5ML IJ SOLN
INTRAMUSCULAR | Status: DC | PRN
  Administered 2015-03-16 (×2): 2 mg via INTRAVENOUS
  Administered 2015-03-16: 1 mg via INTRAVENOUS
  Administered 2015-03-16: 2 mg via INTRAVENOUS

## 2015-03-16 NOTE — Discharge Instructions (Signed)
You had 7 polyps removed. You have internal hemorrhoids.   FOLLOW A HIGH FIBER DIET. AVOID ITEMS THAT CAUSE BLOATING & GAS. SEE INFO BELOW.  YOUR BIOPSY RESULTS WILL BE AVAILABLE IN MY CHART MAR 2 AND MY OFFICE WILL CONTACT YOU IN 10-14 DAYS WITH YOUR RESULTS.   Next colonoscopy in 1-3 years.    Colonoscopy Care After Read the instructions outlined below and refer to this sheet in the next week. These discharge instructions provide you with general information on caring for yourself after you leave the hospital. While your treatment has been planned according to the most current medical practices available, unavoidable complications occasionally occur. If you have any problems or questions after discharge, call DR. Amad Mau, [phone].  ACTIVITY  You may resume your regular activity, but move at a slower pace for the next 24 hours.   Take frequent rest periods for the next 24 hours.   Walking will help get rid of the air and reduce the bloated feeling in your belly (abdomen).   No driving for 24 hours (because of the medicine (anesthesia) used during the test).   You may shower.   Do not sign any important legal documents or operate any machinery for 24 hours (because of the anesthesia used during the test).    NUTRITION  Drink plenty of fluids.   You may resume your normal diet as instructed by your doctor.   Begin with a light meal and progress to your normal diet. Heavy or fried foods are harder to digest and may make you feel sick to your stomach (nauseated).   Avoid alcoholic beverages for 24 hours or as instructed.    MEDICATIONS  You may resume your normal medications.   WHAT YOU CAN EXPECT TODAY  Some feelings of bloating in the abdomen.   Passage of more gas than usual.   Spotting of blood in your stool or on the toilet paper  .  IF YOU HAD POLYPS REMOVED DURING THE COLONOSCOPY:  Eat a soft diet IF YOU HAVE NAUSEA, BLOATING, ABDOMINAL PAIN, OR  VOMITING.    FINDING OUT THE RESULTS OF YOUR TEST Not all test results are available during your visit. DR. Oneida Alar WILL CALL YOU WITHIN 14 DAYS OF YOUR PROCEDUE WITH YOUR RESULTS. Do not assume everything is normal if you have not heard from DR. Analyse Angst, CALL HER OFFICE AT [phone].  SEEK IMMEDIATE MEDICAL ATTENTION AND CALL THE OFFICE: [phone] IF:  You have more than a spotting of blood in your stool.   Your belly is swollen (abdominal distention).   You are nauseated or vomiting.   You have a temperature over 101F.   You have abdominal pain or discomfort that is severe or gets worse throughout the day.   High-Fiber Diet A high-fiber diet changes your normal diet to include more whole grains, legumes, fruits, and vegetables. Changes in the diet involve replacing refined carbohydrates with unrefined foods. The calorie level of the diet is essentially unchanged. The Dietary Reference Intake (recommended amount) for adult males is 38 grams per day. For adult females, it is 25 grams per day. Pregnant and lactating women should consume 28 grams of fiber per day. Fiber is the intact part of a plant that is not broken down during digestion. Functional fiber is fiber that has been isolated from the plant to provide a beneficial effect in the body. PURPOSE  Increase stool bulk.   Ease and regulate bowel movements.   Lower cholesterol.  INDICATIONS THAT  YOU NEED MORE FIBER  Constipation and hemorrhoids.   Uncomplicated diverticulosis (intestine condition) and irritable bowel syndrome.   Weight management.   As a protective measure against hardening of the arteries (atherosclerosis), diabetes, and cancer.   GUIDELINES FOR INCREASING FIBER IN THE DIET  Start adding fiber to the diet slowly. A gradual increase of about 5 more grams (2 slices of whole-wheat bread, 2 servings of most fruits or vegetables, or 1 bowl of high-fiber cereal) per day is best. Too rapid an increase in  fiber may result in constipation, flatulence, and bloating.   Drink enough water and fluids to keep your urine clear or pale yellow. Water, juice, or caffeine-free drinks are recommended. Not drinking enough fluid may cause constipation.   Eat a variety of high-fiber foods rather than one type of fiber.   Try to increase your intake of fiber through using high-fiber foods rather than fiber pills or supplements that contain small amounts of fiber.   The goal is to change the types of food eaten. Do not supplement your present diet with high-fiber foods, but replace foods in your present diet.  INCLUDE A VARIETY OF FIBER SOURCES  Replace refined and processed grains with whole grains, canned fruits with fresh fruits, and incorporate other fiber sources. White rice, white breads, and most bakery goods contain little or no fiber.   Brown whole-grain rice, buckwheat oats, and many fruits and vegetables are all good sources of fiber. These include: broccoli, Brussels sprouts, cabbage, cauliflower, beets, sweet potatoes, white potatoes (skin on), carrots, tomatoes, eggplant, squash, berries, fresh fruits, and dried fruits.   Cereals appear to be the richest source of fiber. Cereal fiber is found in whole grains and bran. Bran is the fiber-rich outer coat of cereal grain, which is largely removed in refining. In whole-grain cereals, the bran remains. In breakfast cereals, the largest amount of fiber is found in those with "bran" in their names. The fiber content is sometimes indicated on the label.   You may need to include additional fruits and vegetables each day.   In baking, for 1 cup white flour, you may use the following substitutions:   1 cup whole-wheat flour minus 2 tablespoons.   1/2 cup white flour plus 1/2 cup whole-wheat flour.   Polyps, Colon  A polyp is extra tissue that grows inside your body. Colon polyps grow in the large intestine. The large intestine, also called the colon, is  part of your digestive system. It is a long, hollow tube at the end of your digestive tract where your body makes and stores stool. Most polyps are not dangerous. They are benign. This means they are not cancerous. But over time, some types of polyps can turn into cancer. Polyps that are smaller than a pea are usually not harmful. But larger polyps could someday become or may already be cancerous. To be safe, doctors remove all polyps and test them.   WHO GETS POLYPS? Anyone can get polyps, but certain people are more likely than others. You may have a greater chance of getting polyps if:  You are over 50.   You have had polyps before.   Someone in your family has had polyps.   Someone in your family has had cancer of the large intestine.   Find out if someone in your family has had polyps. You may also be more likely to get polyps if you:   Eat a lot of fatty foods   Smoke  Drink alcohol   Do not exercise  Eat too much   TREATMENT  The caregiver will remove the polyp during sigmoidoscopy or colonoscopy.    PREVENTION There is not one sure way to prevent polyps. You might be able to lower your risk of getting them if you:  Eat more fruits and vegetables and less fatty food.   Do not smoke.   Avoid alcohol.   Exercise every day.   Lose weight if you are overweight.   Eating more calcium and folate can also lower your risk of getting polyps. Some foods that are rich in calcium are milk, cheese, and broccoli. Some foods that are rich in folate are chickpeas, kidney beans, and spinach.   Hemorrhoids Hemorrhoids are dilated (enlarged) veins around the rectum. Sometimes clots will form in the veins. This makes them swollen and painful. These are called thrombosed hemorrhoids. Causes of hemorrhoids include:  Constipation.   Straining to have a bowel movement.   HEAVY LIFTING HOME CARE INSTRUCTIONS  Eat a well balanced diet and drink 6 to 8 glasses of water every day to  avoid constipation. You may also use a bulk laxative.   Avoid straining to have bowel movements.   Keep anal area dry and clean.   Do not use a donut shaped pillow or sit on the toilet for long periods. This increases blood pooling and pain.   Move your bowels when your body has the urge; this will require less straining and will decrease pain and pressure.

## 2015-03-16 NOTE — Op Note (Signed)
[facility] [address] [place], [address]   COLONOSCOPY PROCEDURE REPORT  PATIENT: Tracy Neal, Tracy Neal  MR#: [id number] BIRTHDATE: 17-Nov-1955 , 73  yrs. old GENDER: female ENDOSCOPIST: Danie Binder, MD REFERRED TK:[id number] [place], [place]:  04/05/2015 PROCEDURE:   Colonoscopy with cold biopsy polypectomy and Colonoscopy with snare polypectomy INDICATIONS:average risk patient for colon cancer. MEDICATIONS: Promethazine (Phenergan) 25 mg IV, Meperidine (Demerol) 125 mg IV, and Versed 6 mg IV  MD STARTED SEDATION: 1303. PROCEDURE COMPLETE: 1359  DESCRIPTION OF PROCEDURE:    Physical exam was performed.  Informed consent was obtained from the patient after explaining the benefits, risks, and alternatives to procedure.  The patient was connected to monitor and placed in left lateral position. Continuous oxygen was provided by nasal cannula and IV medicine administered through an indwelling cannula.  After administration of sedation and rectal exam, the patients rectum was intubated and the EC-3890Li ([id number])  colonoscope was advanced under direct visualization to the cecum.  The scope was removed slowly by carefully examining the color, texture, anatomy, and integrity mucosa on the way out.  The patient was recovered in endoscopy and discharged home in satisfactory condition. Estimated blood loss is zero unless otherwise noted in this procedure report.    COLON FINDINGS: Five sessile polyps ranging from 5 to 78m in size were found in the sigmoid colon, descending colon, and distal transverse colon.  A polypectomy was performed using snare cautery. , Two sessile polyps ranging from 2 to 45min size were found in the descending colon and transverse colon.  A polypectomy was performed with cold forceps.  , and Small internal hemorrhoids were found.  PREP QUALITY: excellent.  CECAL W/D TIME: 27       minutes COMPLICATIONS: None  ENDOSCOPIC IMPRESSION: 1.    SEVEN COLON polyps REMOVED 2.   Small internal hemorrhoids  RECOMMENDATIONS: HIGH FIBER DIET AWAIT BIOPSY NEXT TCS IN 1-3 YEARS    _______________________________ eSigned: Danie BinderMD 0203-20-17:22 PM    CPT CODES: ICD CODES:  The ICD and CPT codes recommended by this software are interpretations from the data that the clinical staff has captured with the software.  The verification of the translation of this report to the ICD and CPT codes and modifiers is the sole responsibility of the health care institution and practicing physician where this report was generated.  [facility] not be held responsible for the validity of the ICD and CPT codes included on this report.  AMA assumes no liability for data contained or not contained herein. CPT is a reDesigner, television/film setf the [employer]

## 2015-03-16 NOTE — H&P (Signed)
Primary Care Physician:  Raiford Simmonds., PA-C Primary Gastroenterologist:  Dr. Oneida Alar  Pre-Procedure History & Physical: HPI:  Tracy Neal is a 60 y.o. female here for [place].   Past Medical History  Diagnosis Date  . COPD (chronic obstructive pulmonary disease) ([place])   . Breast cancer ([place])   . Depression   . Umbilical hernia   . COPD, moderate ([place])   . Basal cell carcinoma     not biopsy confirmed; Right bridge of nose  . Shortness of breath dyspnea   . GERD (gastroesophageal reflux disease)     Past Surgical History  Procedure Laterality Date  . Cholecystectomy    . Tubal ligation    . Breast lumpectomy Right 07/15/2012    Procedure: BREAST LUMPECTOMY WITH AXILLARY LYMPH NODE BIOPSY;  Surgeon: Harl Bowie, MD;  Location: [place];  Service: General;  Laterality: Right;  Nuclear medicine injection 9:30     Prior to Admission medications   Medication Sig Start Date End Date Taking? Authorizing Provider  albuterol (PROVENTIL) (2.5 MG/3ML) 0.083% nebulizer solution Take 2.5 mg by nebulization every 6 (six) hours as needed for wheezing or shortness of breath.   Yes Historical Provider, MD  albuterol-ipratropium (COMBIVENT) 18-103 MCG/ACT inhaler Inhale 1 puff into the lungs 4 (four) times daily. 09/13/12  Yes Baird Cancer, PA-C  amitriptyline (ELAVIL) 25 MG tablet Take 25 mg by mouth at bedtime.   Yes Historical Provider, MD  anastrozole (ARIMIDEX) 1 MG tablet Take 1 mg by mouth daily.   Yes Historical Provider, MD  cyclobenzaprine (FLEXERIL) 5 MG tablet Take 5 mg by mouth 2 (two) times daily as needed for muscle spasms.   Yes Historical Provider, MD  ibuprofen (ADVIL,MOTRIN) 600 MG tablet Take 600 mg by mouth every 8 (eight) hours as needed for moderate pain.    Yes Historical Provider, MD  mirtazapine (REMERON) 15 MG tablet Take 15 mg by mouth at bedtime.   Yes Historical Provider, MD  mometasone-formoterol (DULERA) 100-5 MCG/ACT AERO Inhale 2 puffs  into the lungs 2 (two) times daily.   Yes Historical Provider, MD  Na Sulfate-K Sulfate-Mg Sulf (SUPREP BOWEL PREP) SOLN Take 1 kit by mouth as directed. 02/11/15  Yes Danie Binder, MD  pantoprazole (PROTONIX) 40 MG tablet Take 40 mg by mouth daily.   Yes Historical Provider, MD  acetaminophen (TYLENOL) 500 MG tablet Take 500 mg by mouth every 6 (six) hours as needed for pain. Reported on 01/22/2015    Historical Provider, MD  diphenhydrAMINE (BENADRYL) 25 MG tablet Take 25 mg by mouth every 6 (six) hours as needed. One tablet at bedtime prn    Historical Provider, MD  famotidine-calcium carbonate-magnesium hydroxide (PEPCID COMPLETE) 10-800-165 MG CHEW Chew 1 tablet by mouth 2 (two) times daily as needed. Reported on 01/22/2015    Historical Provider, MD    Allergies as of 02/11/2015 - Review Complete 01/22/2015  Allergen Reaction Noted  . Percocet [oxycodone-acetaminophen] Itching 08/14/2012    Family History  Problem Relation Age of Onset  . Diabetes Mother   . Stroke Mother   . Multiple sclerosis Mother   . Cancer Paternal Aunt     Social History   Social History  . Marital Status: Single    Spouse Name: N/A  . Number of Children: N/A  . Years of Education: N/A   Occupational History  . Not on file.   Social History Main Topics  . Smoking status: Current Some Day Smoker -- 0.00  packs/day for 40 years    Types: Cigarettes  . Smokeless tobacco: Never Used  . Alcohol Use: No  . Drug Use: No  . Sexual Activity: Yes    Birth Control/ Protection: None   Other Topics Concern  . Not on file   Social History Narrative    Review of Systems: See HPI, otherwise negative ROS   Physical Exam: BP 107/66 mmHg  Pulse 86  Temp(Src) 98 F (36.7 C) (Oral)  Resp 23  Ht _0  (1.702 m)  Wt 260 lb (117.935 kg)  BMI 40.71 kg/m2  SpO2 97% General:   Alert,  pleasant and cooperative in NAD Head:  Normocephalic and atraumatic. Neck:  Supple; Lungs:  Clear throughout to  auscultation.    Heart:  Regular rate and rhythm. Abdomen:  Soft, nontender and nondistended. Normal bowel sounds, without guarding, and without rebound.   Neurologic:  Alert and  oriented x4;  grossly normal neurologically.  Impression/Plan:     SCREENING  Plan:  1. TCS TODAY.DISCUSSED PROCEDURE, BENEFITS, & RISKS: < 1% chance of medication reaction, bleeding, perforation, or rupture of spleen/liver.

## 2015-03-22 ENCOUNTER — Encounter (HOSPITAL_COMMUNITY): Payer: Self-pay | Admitting: Gastroenterology

## 2015-03-23 ENCOUNTER — Encounter (HOSPITAL_COMMUNITY): Payer: Medicaid Other

## 2015-03-30 ENCOUNTER — Ambulatory Visit (HOSPITAL_COMMUNITY)
Admission: RE | Admit: 2015-03-30 | Discharge: 2015-03-30 | Disposition: A | Discharge status: Home / Self Care | Payer: Medicaid Other | Source: Ambulatory Visit | Attending: *Deleted | Admitting: *Deleted

## 2015-03-30 DIAGNOSIS — Z1231 Encounter for screening mammogram for malignant neoplasm of breast: Secondary | ICD-10-CM | POA: Insufficient documentation

## 2015-03-30 DIAGNOSIS — Z9889 Other specified postprocedural states: Secondary | ICD-10-CM | POA: Diagnosis not present

## 2015-04-07 DIAGNOSIS — M810 Age-related osteoporosis without current pathological fracture: Secondary | ICD-10-CM | POA: Diagnosis not present

## 2015-04-07 DIAGNOSIS — Z853 Personal history of malignant neoplasm of breast: Secondary | ICD-10-CM | POA: Diagnosis not present

## 2015-04-07 DIAGNOSIS — F17200 Nicotine dependence, unspecified, uncomplicated: Secondary | ICD-10-CM | POA: Diagnosis not present

## 2015-04-07 DIAGNOSIS — J449 Chronic obstructive pulmonary disease, unspecified: Secondary | ICD-10-CM | POA: Diagnosis not present

## 2015-04-07 DIAGNOSIS — Z780 Asymptomatic menopausal state: Secondary | ICD-10-CM | POA: Diagnosis not present

## 2015-04-07 DIAGNOSIS — Z79899 Other long term (current) drug therapy: Secondary | ICD-10-CM | POA: Diagnosis not present

## 2015-04-07 DIAGNOSIS — Z9221 Personal history of antineoplastic chemotherapy: Secondary | ICD-10-CM | POA: Diagnosis not present

## 2015-04-07 DIAGNOSIS — Z923 Personal history of irradiation: Secondary | ICD-10-CM | POA: Diagnosis not present

## 2015-04-07 DIAGNOSIS — M1990 Unspecified osteoarthritis, unspecified site: Secondary | ICD-10-CM | POA: Diagnosis not present

## 2015-04-08 ENCOUNTER — Telehealth: Payer: Self-pay | Admitting: Gastroenterology

## 2015-04-08 ENCOUNTER — Telehealth: Payer: Self-pay

## 2015-04-08 NOTE — Telephone Encounter (Signed)
Pt is aware of results. 

## 2015-04-08 NOTE — Telephone Encounter (Signed)
LMOM to call.

## 2015-04-08 NOTE — Telephone Encounter (Signed)
LMOM for a return call.  

## 2015-04-08 NOTE — Telephone Encounter (Signed)
Pt is aware.  

## 2015-04-08 NOTE — Telephone Encounter (Signed)
Pt was returning call from DS. Please call [phone]

## 2015-04-08 NOTE — Telephone Encounter (Signed)
Please call pt. She had simple adenomas removed.   DRINK WATER TO KEEP YOUR URINE LIGHT YELLOW.  FOLLOW A HIGH FIBER DIET. AVOID ITEMS THAT CAUSE BLOATING & GAS.   Next colonoscopy in 3 years.

## 2015-04-14 ENCOUNTER — Telehealth: Payer: Self-pay | Admitting: *Deleted

## 2015-04-14 NOTE — Telephone Encounter (Signed)
  Oncology Nurse Navigator Documentation  Navigator Location: [facility] (04/14/15 1417) Navigator Encounter Type: Telephone (04/14/15 1417)  I called patient to check in.  She reports that she is doing well.  We discussed her recent mammogram and colonoscopy and their good results.  She denies any questions or concerns at this time.  I encouraged her to call for any needs.           Treatment Phase: Follow-up (04/14/15 1417) Barriers/Navigation Needs: No barriers at this time (04/14/15 1417)   Interventions: None required (04/14/15 1417)            Acuity: Level 1 (04/14/15 1417) Acuity Level 1: Minimal follow up required (04/14/15 1417)       Time Spent with Patient: 15 (04/14/15 1417)

## 2015-05-06 ENCOUNTER — Ambulatory Visit: Payer: Self-pay | Admitting: Family Medicine

## 2015-05-13 ENCOUNTER — Encounter: Payer: Self-pay | Admitting: Family Medicine

## 2015-05-13 ENCOUNTER — Ambulatory Visit (INDEPENDENT_AMBULATORY_CARE_PROVIDER_SITE_OTHER): Payer: Medicare Other | Admitting: Family Medicine

## 2015-05-13 ENCOUNTER — Encounter (INDEPENDENT_AMBULATORY_CARE_PROVIDER_SITE_OTHER): Payer: Self-pay

## 2015-05-13 VITALS — BP 122/84 | HR 83 | Temp 97.5°F | Ht 67.0 in | Wt 261.8 lb

## 2015-05-13 DIAGNOSIS — M810 Age-related osteoporosis without current pathological fracture: Secondary | ICD-10-CM | POA: Insufficient documentation

## 2015-05-13 DIAGNOSIS — M5136 Other intervertebral disc degeneration, lumbar region: Secondary | ICD-10-CM | POA: Insufficient documentation

## 2015-05-13 DIAGNOSIS — M1712 Unilateral primary osteoarthritis, left knee: Secondary | ICD-10-CM | POA: Insufficient documentation

## 2015-05-13 DIAGNOSIS — M179 Osteoarthritis of knee, unspecified: Secondary | ICD-10-CM | POA: Diagnosis not present

## 2015-05-13 DIAGNOSIS — J439 Emphysema, unspecified: Secondary | ICD-10-CM

## 2015-05-13 DIAGNOSIS — M1711 Unilateral primary osteoarthritis, right knee: Secondary | ICD-10-CM | POA: Insufficient documentation

## 2015-05-13 NOTE — Progress Notes (Signed)
BP 122/84 mmHg  Pulse 83  Temp(Src) 97.5 F (36.4 C) (Oral)  Ht '5\' 7"'$  (1.702 m)  Wt 261 lb 12.8 oz (118.752 kg)  BMI 40.99 kg/m2   Subjective:    Patient ID: Tracy Neal, female    DOB: 1955/01/20, 60 y.o.   MRN: [id number]  HPI: Tracy Neal is a 60 y.o. female presenting on 05/13/2015 for New Patient (Initial Visit)   HPI COPD Patient is coming in today for a COPD check as a new patient. Currently she has [facility] which she uses twice a day everyday and Combivent and the nebulizer. She has to use the nebulizer or Combivent 1-2 times daily currently. She says allergies and strong odors are a trigger for her. She does have shortness of breath and wheezing. She does also have shortness of breath and wheezing on walking distances longer than a block. She denies any fevers or chills. She denies any cough that is productive. She does have a cough that is nonproductive. She has been a smoker for 40 years and currently smokes a half a pack a day but has smoked previously up to a pack a day. She is trying to taper herself down but is having some difficulty quitting.  Osteoporosis Patient has been diagnosed with osteoporosis. She is not currently on any medications for it. She reviewed with her today last DEXA scan which showed that she was -4.8 in the spine and -3 in another location. She has not had any fractures.  Osteoarthritis Patient has been diagnosed with arthritis of her knees both sides of her spine and has been told she has degenerative disc disease in her spine. She has a lot of achiness and pains in her lower back and both knees that are chronic and persistent. She currently uses ibuprofen for this. She also uses hydrocodone. She denies any redness or swelling from the knees currently.  Relevant past medical, surgical, family and social history reviewed and updated as indicated. Interim medical history since our last visit reviewed. Allergies and medications reviewed and  updated.  Review of Systems  Constitutional: Negative for fever and chills.  HENT: Negative for congestion, ear discharge and ear pain.   Eyes: Negative for redness and visual disturbance.  Respiratory: Positive for cough, shortness of breath and wheezing. Negative for chest tightness.   Cardiovascular: Negative for chest pain and leg swelling.  Genitourinary: Negative for dysuria and difficulty urinating.  Musculoskeletal: Positive for myalgias, back pain and arthralgias. Negative for joint swelling and gait problem.  Skin: Negative for rash.  Neurological: Negative for light-headedness and headaches.  Psychiatric/Behavioral: Negative for behavioral problems and agitation.  All other systems reviewed and are negative.   Per HPI unless specifically indicated above  Past Surgical History  Procedure Laterality Date  . Cholecystectomy    . Tubal ligation    . Breast lumpectomy Right 07/15/2012    Procedure: BREAST LUMPECTOMY WITH AXILLARY LYMPH NODE BIOPSY;  Surgeon: Harl Bowie, MD;  Location: [place];  Service: General;  Laterality: Right;  Nuclear medicine injection 9:30   . Colonoscopy N/A 03/16/2015    Procedure: COLONOSCOPY;  Surgeon: Danie Binder, MD;  [facility];  Service: Endoscopy;  Laterality: N/A;  2:30 PM - moved to 12:30 - office to notify pt    Past Medical History  Diagnosis Date  . COPD (chronic obstructive pulmonary disease) ([place])   . Breast cancer ([place])   . Depression   . Umbilical hernia   .  COPD, moderate ([place])   . Basal cell carcinoma     not biopsy confirmed; Right bridge of nose  . Shortness of breath dyspnea   . GERD (gastroesophageal reflux disease)       Medication List       This list is accurate as of: 05/13/15  9:53 AM.  Always use your most recent med list.               albuterol (2.5 MG/3ML) 0.083% nebulizer solution  Commonly known as:  PROVENTIL  Take 2.5 mg by nebulization every 6 (six) hours as needed for wheezing  or shortness of breath.     albuterol-ipratropium 18-103 MCG/ACT inhaler  Commonly known as:  COMBIVENT  Inhale 1 puff into the lungs 4 (four) times daily.     diphenhydrAMINE 25 MG tablet  Commonly known as:  BENADRYL  Take 25 mg by mouth every 6 (six) hours as needed. One tablet at bedtime prn     famotidine-calcium carbonate-magnesium hydroxide 10-800-165 MG chewable tablet  Commonly known as:  PEPCID COMPLETE  Chew 1 tablet by mouth 2 (two) times daily as needed. Reported on 01/22/2015     HYDROcodone-acetaminophen 5-325 MG tablet  Commonly known as:  NORCO/VICODIN     ibuprofen 600 MG tablet  Commonly known as:  ADVIL,MOTRIN  Take 600 mg by mouth every 8 (eight) hours as needed for moderate pain.     mometasone-formoterol 100-5 MCG/ACT Aero  Commonly known as:  DULERA  Inhale 2 puffs into the lungs 2 (two) times daily.     penicillin v potassium 500 MG tablet  Commonly known as:  VEETID          Objective:    BP 122/84 mmHg  Pulse 83  Temp(Src) 97.5 F (36.4 C) (Oral)  Ht '5\' 7"'$  (1.702 m)  Wt 261 lb 12.8 oz (118.752 kg)  BMI 40.99 kg/m2  Wt Readings from Last 3 Encounters:  05/13/15 261 lb 12.8 oz (118.752 kg)  03/16/15 260 lb (117.935 kg)  04/15/13 226 lb 12.8 oz (102.876 kg)    Physical Exam  Constitutional: She is oriented to person, place, and time. She appears well-developed and well-nourished. No distress.  HENT:  Right Ear: External ear normal.  Left Ear: External ear normal.  Nose: Nose normal.  Mouth/Throat: Oropharynx is clear and moist. No oropharyngeal exudate.  Eyes: Conjunctivae and EOM are normal. Pupils are equal, round, and reactive to light.  Cardiovascular: Normal rate, regular rhythm, normal heart sounds and intact distal pulses.   No murmur heard. Pulmonary/Chest: Effort normal. No respiratory distress. She has wheezes (Mild end expiratory wheeze).  Musculoskeletal: Normal range of motion. She exhibits tenderness. She exhibits no edema.        Lumbar back: She exhibits tenderness (Paraspinal tenderness bilaterally and lumbar region, negative straight leg raise, no numbness or weakness in either lower extremity). She exhibits normal range of motion and no deformity.  Bilateral knee tenderness along the medial joint lines, worse on left than right. No joint laxity noted  Neurological: She is alert and oriented to person, place, and time. Coordination normal.  Skin: Skin is warm and dry. No rash noted. She is not diaphoretic.  Psychiatric: She has a normal mood and affect. Her behavior is normal.  Nursing note and vitals reviewed.       Assessment & Plan:   Problem List Items Addressed This Visit      Respiratory   COPD (chronic obstructive pulmonary disease) ([place]) -  Primary     Musculoskeletal and Integument   Osteoporosis   Osteoarthritis of right knee   Relevant Medications   HYDROcodone-acetaminophen (NORCO/VICODIN) 5-325 MG tablet   Osteoarthritis of left knee   Relevant Medications   HYDROcodone-acetaminophen (NORCO/VICODIN) 5-325 MG tablet   Degenerative disc disease, lumbar   Relevant Medications   HYDROcodone-acetaminophen (NORCO/VICODIN) 5-325 MG tablet       Follow up plan: Return in about 4 weeks (around 06/10/2015), or if symptoms worsen or fail to improve, for tammy for osteoporosis and recheck COPD.  Counseling provided for all of the vaccine components No orders of the defined types were placed in this encounter.    Caryl Pina, MD [facility] 05/13/2015, 9:53 AM

## 2015-05-24 ENCOUNTER — Ambulatory Visit: Payer: Medicare HMO | Admitting: Pharmacist

## 2015-05-25 ENCOUNTER — Encounter: Payer: Self-pay | Admitting: Family Medicine

## 2015-06-10 ENCOUNTER — Ambulatory Visit (INDEPENDENT_AMBULATORY_CARE_PROVIDER_SITE_OTHER): Payer: Medicare Other | Admitting: Family Medicine

## 2015-06-10 ENCOUNTER — Encounter: Payer: Self-pay | Admitting: Family Medicine

## 2015-06-10 VITALS — BP 124/78 | HR 80 | Temp 97.3°F | Ht 67.0 in | Wt 255.4 lb

## 2015-06-10 DIAGNOSIS — M1711 Unilateral primary osteoarthritis, right knee: Secondary | ICD-10-CM

## 2015-06-10 DIAGNOSIS — M179 Osteoarthritis of knee, unspecified: Secondary | ICD-10-CM | POA: Diagnosis not present

## 2015-06-10 DIAGNOSIS — F419 Anxiety disorder, unspecified: Secondary | ICD-10-CM

## 2015-06-10 DIAGNOSIS — M810 Age-related osteoporosis without current pathological fracture: Secondary | ICD-10-CM

## 2015-06-10 DIAGNOSIS — F418 Other specified anxiety disorders: Secondary | ICD-10-CM

## 2015-06-10 DIAGNOSIS — K219 Gastro-esophageal reflux disease without esophagitis: Secondary | ICD-10-CM | POA: Diagnosis not present

## 2015-06-10 DIAGNOSIS — F329 Major depressive disorder, single episode, unspecified: Secondary | ICD-10-CM | POA: Insufficient documentation

## 2015-06-10 DIAGNOSIS — F32A Depression, unspecified: Secondary | ICD-10-CM | POA: Insufficient documentation

## 2015-06-10 DIAGNOSIS — J439 Emphysema, unspecified: Secondary | ICD-10-CM

## 2015-06-10 DIAGNOSIS — M1712 Unilateral primary osteoarthritis, left knee: Secondary | ICD-10-CM

## 2015-06-10 MED ORDER — BUPROPION HCL ER (XL) 150 MG PO TB24: 150.0000 mg | ORAL_TABLET | Freq: Every day | ORAL | Status: DC
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
## 2015-06-11 ENCOUNTER — Ambulatory Visit (INDEPENDENT_AMBULATORY_CARE_PROVIDER_SITE_OTHER): Payer: Medicare Other | Admitting: Pharmacist

## 2015-06-11 ENCOUNTER — Encounter: Payer: Self-pay | Admitting: Pharmacist

## 2015-06-11 VITALS — Ht 65.5 in | Wt 264.0 lb

## 2015-06-11 DIAGNOSIS — M810 Age-related osteoporosis without current pathological fracture: Secondary | ICD-10-CM | POA: Diagnosis not present

## 2015-06-11 DIAGNOSIS — R7309 Other abnormal glucose: Secondary | ICD-10-CM

## 2015-06-11 MED ORDER — ALENDRONATE SODIUM 70 MG PO TABS: 70.0000 mg | ORAL_TABLET | ORAL | Status: DC

## 2015-06-11 NOTE — Patient Instructions (Signed)
Fall Prevention in the Home  Falls can cause injuries and can affect people from all age groups. There are many simple things that you can do to make your home safe and to help prevent falls. WHAT CAN I DO ON THE OUTSIDE OF MY HOME?  Regularly repair the edges of walkways and driveways and fix any cracks.  Remove high doorway thresholds.  Trim any shrubbery on the main path into your home.  Use bright outdoor lighting.  Clear walkways of debris and clutter, including tools and rocks.  Regularly check that handrails are securely fastened and in good repair. Both sides of any steps should have handrails.  Install guardrails along the edges of any raised decks or porches.  Have leaves, snow, and ice cleared regularly.  Use sand or salt on walkways during winter months.  In the garage, clean up any spills right away, including grease or oil spills. WHAT CAN I DO IN THE BATHROOM?  Use night lights.  Install grab bars by the toilet and in the tub and shower. Do not use towel bars as grab bars.  Use non-skid mats or decals on the floor of the tub or shower.  If you need to sit down while you are in the shower, use a plastic, non-slip stool..  Keep the floor dry. Immediately clean up any water that spills on the floor.  Remove soap buildup in the tub or shower on a regular basis.  Attach bath mats securely with double-sided non-slip rug tape.  Remove throw rugs and other tripping hazards from the floor. WHAT CAN I DO IN THE BEDROOM?  Use night lights.  Make sure that a bedside light is easy to reach.  Do not use oversized bedding that drapes onto the floor.  Have a firm chair that has side arms to use for getting dressed.  Remove throw rugs and other tripping hazards from the floor. WHAT CAN I DO IN THE KITCHEN?   Clean up any spills right away.  Avoid walking on wet floors.  Place frequently used items in easy-to-reach places.  If you need to reach for something  above you, use a sturdy step stool that has a grab bar.  Keep electrical cables out of the way.  Do not use floor polish or wax that makes floors slippery. If you have to use wax, make sure that it is non-skid floor wax.  Remove throw rugs and other tripping hazards from the floor. WHAT CAN I DO IN THE STAIRWAYS?  Do not leave any items on the stairs.  Make sure that there are handrails on both sides of the stairs. Fix handrails that are broken or loose. Make sure that handrails are as long as the stairways.  Check any carpeting to make sure that it is firmly attached to the stairs. Fix any carpet that is loose or worn.  Avoid having throw rugs at the top or bottom of stairways, or secure the rugs with carpet tape to prevent them from moving.  Make sure that you have a light switch at the top of the stairs and the bottom of the stairs. If you do not have them, have them installed. WHAT ARE SOME OTHER FALL PREVENTION TIPS?  Wear closed-toe shoes that fit well and support your feet. Wear shoes that have rubber soles or low heels.  When you use a stepladder, make sure that it is completely opened and that the sides are firmly locked. Have someone hold the ladder while you   are using it. Do not climb a closed stepladder.  Add color or contrast paint or tape to grab bars and handrails in your home. Place contrasting color strips on the first and last steps.  Use mobility aids as needed, such as canes, walkers, scooters, and crutches.  Turn on lights if it is dark. Replace any light bulbs that burn out.  Set up furniture so that there are clear paths. Keep the furniture in the same spot.  Fix any uneven floor surfaces.  Choose a carpet design that does not hide the edge of steps of a stairway.  Be aware of any and all pets.  Review your medicines with your healthcare provider. Some medicines can cause dizziness or changes in blood pressure, which increase your risk of falling. Talk  with your health care provider about other ways that you can decrease your risk of falls. This may include working with a physical therapist or trainer to improve your strength, balance, and endurance.   This information is not intended to replace advice given to you by your health care provider. Make sure you discuss any questions you have with your health care provider.   Document Released: 12/23/2001 Document Revised: 05/19/2014 Document Reviewed: 02/06/2014 Elsevier Interactive Patient Education 2016 [employer]                Exercise for Strong Bones  Exercise is important to build and maintain strong bones / bone density.  There are 2 types of exercises that are important to building and maintaining strong bones:  Weight- bearing and muscle-stregthening.  Weight-bearing Exercises  These exercises include activities that make you move against gravity while staying upright. Weight-bearing exercises can be high-impact or low-impact.  High-impact weight-bearing exercises help build bones and keep them strong. If you have broken a bone due to osteoporosis or are at risk of breaking a bone, you may need to avoid high-impact exercises. If you're not sure, you should check with your healthcare provider.  Examples of high-impact weight-bearing exercises are: Dancing  Doing high-impact aerobics  Hiking  Jogging/running  Jumping Rope  Stair climbing  Tennis  Low-impact weight-bearing exercises can also help keep bones strong and are a safe alternative if you cannot do high-impact exercises.   Examples of low-impact weight-bearing exercises are: Using elliptical training machines  Doing low-impact aerobics  Using stair-step machines  Fast walking on a treadmill or outside   Muscle-Strengthening Exercises These exercises include activities where you move your body, a weight or some other resistance against gravity. They are also known as resistance exercises and include: Lifting  weights  Using elastic exercise bands  Using weight machines  Lifting your own body weight  Functional movements, such as standing and rising up on your toes  Yoga and Pilates can also improve strength, balance and flexibility. However, certain positions may not be safe for people with osteoporosis or those at increased risk of broken bones. For example, exercises that have you bend forward may increase the chance of breaking a bone in the spine.   Non-Impact Exercises There are other types of exercises that can help prevent falls.  Non-impact exercises can help you to improve balance, posture and how well you move in everyday activities. Some of these exercises include: Balance exercises that strengthen your legs and test your balance, such as Tai Chi, can decrease your risk of falls.  Posture exercises that improve your posture and reduce rounded or "sloping" shoulders can help you decrease the chance   breaking a bone, especially in the spine.  Functional exercises that improve how well you move can help you with everyday activities and decrease your chance of falling and breaking a bone. For example, if you have trouble getting up from a chair or climbing stairs, you should do these activities as exercises.   **A physical therapist can teach you balance, posture and functional exercises. He/she can also help you learn which exercises are safe and appropriate for you.  [facility] has a physical therapy office in [place] in front of our office and referrals can be made for assessments and treatment as needed and strength and balance training.  If you would like to have an assessment with Chad and our physical therapy team please let a nurse or provider know.  Calcium & Vitamin D: The Facts  Why is calcium and vitamin D consumption important? Calcium: . Most Americans do not consume adequate amounts of calcium! Calcium is required for proper muscle function, nerve communication, bone  support, and many other functions in the body.  . The body uses bones as a source of calcium. Bones 'remodel' themselves continuously - the body constantly breaks bone down to release calcium and rebuilds bones by replacing calcium in the bone later.  . As we get older, the rate of bone breakdown occurs faster than bone rebuilding which could lead to osteopenia, osteoporosis, and possible fractures.   Vitamin D: . People naturally make vitamin D in the body when sunlight hits the skin and triggers a process that leads to vitamin D production. This natural vitamin D production requires about 10-15 minutes of sun exposure on the hands, arms, and face at least 2-3 times per week. However, due to decreased sun exposure and the use of sunscreen, most people will need to get additional vitamin D from foods or supplements. Your doctor can measure your body's vitamin D level through a simple blood test to determine your daily vitamin D needs.  . Vitamin D is used to help the body absorb calcium, maintain bone health, help the immune system, and reduce inflammation. It also plays a role in muscle performance, balance and risk of falling.  . Vitamin D deficiency can lead to osteomalacia or softening of the bones, bone pain, and muscle weakness.   The recommended daily allowance of Calcium and Vitamin D varies for different age groups. Age group Calcium (mg) Vitamin D (IU)  Females and Males: Age 19-50 1000 mg 600 IU  Females: Age 51- 70 1200 mg 600 IU  Males: Age 51-70 1000 mg 600 IU  Females and Males: Age 71+ 1200 mg 800 IU  Pregnant/lactating Females age 19-50 1000 mg 600 IU   How much Calcium do you get in your diet? Calcium Intake # of servings per day  Total calcium (mg)  Skim milk, 2% milk (1 cup) _________ x 300 mg   Yogurt (1 small container) _________ x 200 mg   Cheese (1oz) _________ x 200 mg   Cottage Cheese (1 cup)             ________ x 150 mg   Almond milk (1 cup) _________ x 450 mg    Fortified Orange Juice (1 cup) _________ x 300 mg   Broccoli or spinach ( 1 cup) _________ x 100 mg   Salmon (3 oz) _________ x 150 mg    Almonds (1/4 cup) _______ x 90 mg      How do we get Calcium and Vitamin D in   our diet? Calcium: . Obtaining calcium from the diet is the most preferred way to reach the recommended daily goal. If this goal is not reached through diet, calcium supplements are available.  . Calcium is found in many foods including: dairy products, dark leafy vegetables (like broccoli, kale, and spinach), fish, and fortified products like juices and cereals.  . The food label will have a %DV (percent daily value) listed showing the amount of calcium per serving. To determine the total mg per serving, simply replace the % with zero (0).  For example, Almond Breeze almond milk contains 45% DV of calcium or 450mg per 1 cup.  . You can increase the amount of calcium in your diet by using more calcium products in your daily meals. Use yogurt and fruit to make smoothies or use yogurt to top baked potatoes or make whipped potatoes. Sprinkle low fat cheese onto salads or into egg white omelets. You can even add non-fat dry milk powder (300mg calcium per 1/3 cup) to hot cereals, meat loaf, soups, or potatoes.  . Calcium supplements come in many forms including tablets, chewables, and gummies. Be sure to read the label to determine the correct number of tablets per serving and whether or not to take the supplement with food.  . Calcium carbonate products (Oscal, Caltrate, and Viactiv) are generally better absorbed when taken with food while calcium citrate products like Citracal can be taken with or without food.  . The body can only absorb about 600 mg of calcium at one time. It is recommended to take calcium supplements in small amounts several times per day.  However, taking it all at once is better than not taking it at all. . Increasing your intake of calcium is essential for bone  health, but may also lead to some side effects like constipation, increased gas, bloating or abdominal cramping. To help reduce these side effects, start with 1 tablet per day and slowly increase your intake of the supplement to the recommended doses. It is also recommended that you drink plenty of water each day. Vitamin D: . Very few foods naturally contain vitamin D. However, it is found in saltwater fish (like tuna, salmon and mackerel), beef liver, egg yolks, cheese and vitamin D fortified foods (like yogurt, cereals, orange juice and milk) . The amount of vitamin D in each food or product is listed as %DV on the product label. To determine the total amount of vitamin D per serving, drop the % sign and multiply the number by 4. For example, 1 cup of Almond Breeze almond milk contains 25% DV vitamin D or 100 IU per serving (25 x 4 =100). . Vitamin D is also found in multivitamins and supplements and may be listed as ergocalciferol (vitamin D2) or cholecalciferol (vitamin D3). Each of these forms of vitamin D are equivalent and the daily recommended intake will vary based on your age and the vitamin D levels in your body. Follow your doctor's recommendation for vitamin D intake.         

## 2015-06-11 NOTE — Progress Notes (Signed)
  Osteoporosis Clinic Current Height: Height: 5' 5.5" (166.4 cm)      Max Lifetime Height:  5' 5.5" Current Weight: Weight: 264 lb (119.75 kg)       Ethnicity:Caucasian   HPI: Patient with osteoporosis per most recent BMD.  Dexa in 2015 showed osteopenia. At that time patient was going to [facility] in [place].   Back Pain?  No       Kyphosis?  No Prior fracture?  No Med(s) for Osteoporosis/Osteopenia:  None  Med(s) previously tried for Osteoporosis/Osteopenia:  none                                                             PMH: Age at menopause:  39 to Hysterectomy?  No Oophorectomy?  No HRT? No Steroid Use?  Yes - Current.  Type/duration: inhaled steroids - Dulara Thyroid med?  No History of cancer?  Yes - breast, left - diagnoses in 2014 History of digestive disorders (ie Crohn's)?  No Current or previous eating disorders?  No Last Vitamin D Result:  Not checked in our office  Last GFR Result:  Last checked 2014 - creatinine was 1.0 (08/14/2012)   FH/SH: Family history of osteoporosis?  No Parent with history of hip fracture?  No Family history of breast cancer?  No Exercise?  No Smoking?  Yes  Alcohol?  No    Calcium Assessment Calcium Intake  # of servings/day  Calcium mg  Milk (8 oz) 1  x  300  = 362m  Yogurt (4 oz) 0 x  200 = 0  Cheese (1 oz) 1 x  200 = 2057m Other Calcium sources   25071mCa supplement 0 = 0   Estimated calcium intake per day 750m38m DEXA Results Date of Test T-Score for AP Spine L1-L4 T-Score for neck of  Left Hip T-Score for neck of  Right Hip  04/07/2015 -1.0 -3.3 -1.8  04/11/2013 -0.5 -2.2 -2.0             Assessment: osteoporosis  Recommendations: 1.  Start  alendronate (FOSAMAX) 70mg37me 1 tablet weekly 2.  recommend calcium 1200mg 18my through supplementation or diet.  3.  recommend weight bearing exercise - as able.  Also gave handout for chair exercise to help build strength in legs and core 4.  Counseled  and educated about fall risk and prevention. 5.  Discussed smoking cessation - patient has buproprion and plans to start tomorrow.  Discussed plan for smoking cessation and tigger avoidance. 6.   Orders Placed This Encounter  Procedures  . BMP8+EGFR  . VITAMIN D 25 Hydroxy (Vit-D Deficiency, Fractures)    Recheck DEXA: 2 to  5 years  Time spent counseling patient:  30 minutes

## 2015-06-12 LAB — VITAMIN D 25 HYDROXY (VIT D DEFICIENCY, FRACTURES): Vit D, 25-Hydroxy: 31.7 ng/mL (ref 30.0–100.0)

## 2015-06-12 LAB — BMP8+EGFR
BUN / CREAT RATIO: 11 — AB (ref 12–28)
BUN: 12 mg/dL (ref 8–27)
CO2: 23 mmol/L (ref 18–29)
CREATININE: 1.06 mg/dL — AB (ref 0.57–1.00)
Calcium: 9.8 mg/dL (ref 8.7–10.3)
Chloride: 99 mmol/L (ref 96–106)
GFR, EST AFRICAN AMERICAN: 65 mL/min/{1.73_m2} (ref >59)
GFR, EST NON AFRICAN AMERICAN: 57 mL/min/{1.73_m2} — AB (ref >59)
Glucose: 125 mg/dL — ABNORMAL HIGH (ref 65–99)
Potassium: 4.4 mmol/L (ref 3.5–5.2)
SODIUM: 140 mmol/L (ref 134–144)

## 2015-06-17 ENCOUNTER — Other Ambulatory Visit: Payer: Self-pay | Admitting: Pharmacist

## 2015-06-17 ENCOUNTER — Ambulatory Visit: Payer: Medicare Other | Admitting: Family Medicine

## 2015-06-17 DIAGNOSIS — R7309 Other abnormal glucose: Secondary | ICD-10-CM

## 2015-06-18 LAB — BAYER DCA HB A1C WAIVED: HB A1C (BAYER DCA - WAIVED): 5.1 % (ref <7.0)

## 2015-06-18 NOTE — Addendum Note (Signed)
Addended by: Cherre Robins on: 06/18/2015 02:53 PM   Modules accepted: Orders

## 2015-07-01 ENCOUNTER — Telehealth: Payer: Self-pay | Admitting: Family Medicine

## 2015-07-02 NOTE — Telephone Encounter (Signed)
Ok  Will look into this

## 2015-07-05 ENCOUNTER — Ambulatory Visit (INDEPENDENT_AMBULATORY_CARE_PROVIDER_SITE_OTHER): Payer: Medicare Other | Admitting: Family Medicine

## 2015-07-05 ENCOUNTER — Encounter: Payer: Self-pay | Admitting: Family Medicine

## 2015-07-05 VITALS — BP 138/90 | HR 87 | Temp 98.1°F | Ht 65.5 in | Wt 251.0 lb

## 2015-07-05 DIAGNOSIS — M179 Osteoarthritis of knee, unspecified: Secondary | ICD-10-CM

## 2015-07-05 DIAGNOSIS — F419 Anxiety disorder, unspecified: Secondary | ICD-10-CM

## 2015-07-05 DIAGNOSIS — F418 Other specified anxiety disorders: Secondary | ICD-10-CM | POA: Diagnosis not present

## 2015-07-05 DIAGNOSIS — M1712 Unilateral primary osteoarthritis, left knee: Secondary | ICD-10-CM

## 2015-07-05 DIAGNOSIS — F329 Major depressive disorder, single episode, unspecified: Secondary | ICD-10-CM

## 2015-07-05 MED ORDER — METHYLPREDNISOLONE ACETATE 80 MG/ML IJ SUSP
80.0000 mg | Freq: Once | INTRAMUSCULAR | Status: AC
  Administered 2015-07-05: 80 mg via INTRAMUSCULAR

## 2015-07-05 MED ORDER — ESCITALOPRAM OXALATE 10 MG PO TABS: 10.0000 mg | ORAL_TABLET | Freq: Every day | ORAL | Status: DC

## 2015-07-05 NOTE — Progress Notes (Signed)
BP 138/90 mmHg  Pulse 87  Temp(Src) 98.1 F (36.7 C) (Oral)  Ht 5' 5.5" (1.664 m)  Wt 251 lb (113.853 kg)  BMI 41.12 kg/m2   Subjective:    Patient ID: Tracy Neal, female    DOB: [birth year], 60 y.o.   MRN: [id number]  HPI: Tracy Neal is a 60 y.o. female presenting on 07/05/2015 for Anxiety   HPI Anxiety and depression Patient is coming in today for an anxiety depression recheck. She was on Wellbutrin for 4 days but she just felt like it made her sleepy all the time and she couldn't tolerate it so she stopped it. She is coming in today because she would like to try something else to help with her anxiety. She feels like she is feeling somewhat better from depression just from being on that for that short time. She also feels like it did help her a lot with smoking cessation and she is down to just a few cigarettes a day. She is hoping that she can keep going with the smoking cessation. She denies any suicidal ideations or thoughts of hurting herself.  Left knee and hip pain She has had this left knee and hip pain chronically but more recently she is having a lot more pressure and pain in that left knee and left hip and feels like because of the weather recently and is affecting her more. She has had a steroid injection in her knee and hip previously but it is been over a year since she's had one of those. She denies any fevers or chills or numbness or weakness.  Relevant past medical, surgical, family and social history reviewed and updated as indicated. Interim medical history since our last visit reviewed. Allergies and medications reviewed and updated.  Review of Systems  Constitutional: Negative for fever and chills.  HENT: Negative for congestion, ear discharge and ear pain.   Eyes: Negative for redness and visual disturbance.  Respiratory: Negative for chest tightness and shortness of breath.   Cardiovascular: Negative for chest pain and leg swelling.  Genitourinary:  Negative for dysuria and difficulty urinating.  Musculoskeletal: Positive for arthralgias. Negative for back pain, joint swelling and gait problem.  Skin: Negative for color change and rash.  Neurological: Negative for dizziness, light-headedness and headaches.  Psychiatric/Behavioral: Positive for dysphoric mood. Negative for suicidal ideas, behavioral problems, sleep disturbance, self-injury and agitation. The patient is nervous/anxious.   All other systems reviewed and are negative.   Per HPI unless specifically indicated above     Medication List       This list is accurate as of: 07/05/15  4:22 PM.  Always use your most recent med list.               PROVENTIL HFA 108 (90 Base) MCG/ACT inhaler  Generic drug:  albuterol     albuterol (2.5 MG/3ML) 0.083% nebulizer solution  Commonly known as:  PROVENTIL  Take 2.5 mg by nebulization every 6 (six) hours as needed for wheezing or shortness of breath.     albuterol-ipratropium 18-103 MCG/ACT inhaler  Commonly known as:  COMBIVENT  Inhale 1 puff into the lungs 4 (four) times daily.     COMBIVENT RESPIMAT 20-100 MCG/ACT Aers respimat  Generic drug:  Ipratropium-Albuterol     alendronate 70 MG tablet  Commonly known as:  FOSAMAX  Take 1 tablet (70 mg total) by mouth every 7 (seven) days. Take with a full glass of water on an empty stomach.  buPROPion 150 MG 24 hr tablet  Commonly known as:  WELLBUTRIN XL  Take 1 tablet (150 mg total) by mouth daily.     diphenhydrAMINE 25 MG tablet  Commonly known as:  BENADRYL  Take 25 mg by mouth every 6 (six) hours as needed. Reported on 07/05/2015     escitalopram 10 MG tablet  Commonly known as:  LEXAPRO  Take 1 tablet (10 mg total) by mouth daily.     famotidine-calcium carbonate-magnesium hydroxide 10-800-165 MG chewable tablet  Commonly known as:  PEPCID COMPLETE  Chew 1 tablet by mouth 2 (two) times daily as needed. Reported on 07/05/2015     HYDROcodone-acetaminophen 5-325  MG tablet  Commonly known as:  NORCO/VICODIN  Reported on 07/05/2015     ibuprofen 600 MG tablet  Commonly known as:  ADVIL,MOTRIN  Take 600 mg by mouth every 8 (eight) hours as needed for moderate pain.     mometasone-formoterol 100-5 MCG/ACT Aero  Commonly known as:  DULERA  Inhale 2 puffs into the lungs 2 (two) times daily.           Objective:    BP 138/90 mmHg  Pulse 87  Temp(Src) 98.1 F (36.7 C) (Oral)  Ht 5' 5.5" (1.664 m)  Wt 251 lb (113.853 kg)  BMI 41.12 kg/m2  Wt Readings from Last 3 Encounters:  07/05/15 251 lb (113.853 kg)  06/11/15 264 lb (119.75 kg)  06/10/15 255 lb 6.4 oz (115.849 kg)    Physical Exam  Constitutional: She is oriented to person, place, and time. She appears well-developed and well-nourished. No distress.  Eyes: Conjunctivae and EOM are normal. Pupils are equal, round, and reactive to light.  Neck: Neck supple. No thyromegaly present.  Cardiovascular: Normal rate, regular rhythm, normal heart sounds and intact distal pulses.   No murmur heard. Pulmonary/Chest: Effort normal and breath sounds normal. No respiratory distress. She has no wheezes.  Musculoskeletal: Normal range of motion. She exhibits tenderness (Tenderness and no left knee more medially than laterally. Also some tenderness with range of motion of the hip. No skin changes or overlying warmth or swelling noted.). She exhibits no edema.  Lymphadenopathy:    She has no cervical adenopathy.  Neurological: She is alert and oriented to person, place, and time. Coordination normal.  Skin: Skin is warm and dry. No rash noted. She is not diaphoretic.  Psychiatric: Her behavior is normal. Judgment and thought content normal. Her mood appears anxious. She exhibits a depressed mood. She expresses no suicidal ideation. She expresses no suicidal plans.  Nursing note and vitals reviewed.     Assessment & Plan:       Problem List Items Addressed This Visit      Musculoskeletal and  Integument   Osteoarthritis of left knee - Primary   Relevant Medications   methylPREDNISolone acetate (DEPO-MEDROL) injection 80 mg (Start on 07/05/2015  4:30 PM)     Other   Anxiety and depression   Relevant Medications   escitalopram (LEXAPRO) 10 MG tablet       Follow up plan: Return in about 4 weeks (around 08/02/2015), or if symptoms worsen or fail to improve, for Recheck anxiety and depression.  Counseling provided for all of the vaccine components No orders of the defined types were placed in this encounter.    Caryl Pina, MD [facility] 07/05/2015, 4:22 PM

## 2015-07-14 ENCOUNTER — Ambulatory Visit: Payer: Medicare Other | Admitting: Family Medicine

## 2015-07-29 ENCOUNTER — Other Ambulatory Visit: Payer: Self-pay | Admitting: Family Medicine

## 2015-07-29 MED ORDER — OMEPRAZOLE 20 MG PO CPDR: 20.0000 mg | DELAYED_RELEASE_CAPSULE | Freq: Every day | ORAL | Status: DC

## 2015-07-29 NOTE — Telephone Encounter (Signed)
Okay to call in Omeprazole 20 mg every before meals, #30, 3 refills

## 2015-08-24 ENCOUNTER — Other Ambulatory Visit: Payer: Self-pay

## 2015-08-24 MED ORDER — ALBUTEROL SULFATE (2.5 MG/3ML) 0.083% IN NEBU: 2.5000 mg | INHALATION_SOLUTION | Freq: Four times a day (QID) | RESPIRATORY_TRACT | 2 refills | Status: DC | PRN

## 2015-09-07 ENCOUNTER — Telehealth: Payer: Self-pay

## 2015-09-07 MED ORDER — IBUPROFEN 800 MG PO TABS: 800.0000 mg | ORAL_TABLET | Freq: Three times a day (TID) | ORAL | 0 refills | Status: DC | PRN

## 2015-09-07 NOTE — Telephone Encounter (Signed)
Yes go ahead and refill, tell her to take them with food always and we will decide to watch her kidney function in the future

## 2015-09-07 NOTE — Telephone Encounter (Signed)
Rx sent to the pharmacy and left detailed message on pt voicemail stating requested rx had been sent to the pharmacy and to call back with any further questions or concerns.

## 2015-09-07 NOTE — Telephone Encounter (Signed)
Patient would like refill of Motrin 800 mg, one every 8 hours as needed.  Please advise.

## 2015-09-10 ENCOUNTER — Encounter: Payer: Self-pay | Admitting: Family Medicine

## 2015-09-10 ENCOUNTER — Ambulatory Visit (INDEPENDENT_AMBULATORY_CARE_PROVIDER_SITE_OTHER): Payer: Medicare Other | Admitting: Family Medicine

## 2015-09-10 VITALS — BP 139/74 | HR 83 | Temp 98.0°F | Ht 65.5 in | Wt 251.0 lb

## 2015-09-10 DIAGNOSIS — F418 Other specified anxiety disorders: Secondary | ICD-10-CM

## 2015-09-10 DIAGNOSIS — D229 Melanocytic nevi, unspecified: Secondary | ICD-10-CM | POA: Diagnosis not present

## 2015-09-10 DIAGNOSIS — F329 Major depressive disorder, single episode, unspecified: Secondary | ICD-10-CM

## 2015-09-10 DIAGNOSIS — F419 Anxiety disorder, unspecified: Principal | ICD-10-CM

## 2015-09-10 DIAGNOSIS — F32A Depression, unspecified: Secondary | ICD-10-CM

## 2015-09-10 MED ORDER — BUPROPION HCL ER (XL) 150 MG PO TB24: 150.0000 mg | ORAL_TABLET | Freq: Every day | ORAL | 1 refills | Status: DC

## 2015-09-10 MED ORDER — AMITRIPTYLINE HCL 25 MG PO TABS: 25.0000 mg | ORAL_TABLET | Freq: Every day | ORAL | 1 refills | Status: DC

## 2015-09-10 NOTE — Progress Notes (Signed)
BP 139/74   Pulse 83   Temp 98 F (36.7 C) (Oral)   Ht 5' 5.5" (1.664 m)   Wt 251 lb (113.9 kg)   BMI 41.13 kg/m    Subjective:    Patient ID: Tracy Neal, female    DOB: [birth year], 60 y.o.   MRN: [id number]  HPI: Tracy Neal is a 60 y.o. female presenting on 09/10/2015 for Anxiety and depression (1 month followup)   HPI Anxiety and depression Patient comes in for a recheck for anxiety and depression. She tried the Lexapro for a few days and then did not like it so switched back to the Elavil. Patient did not like the Lexapro and did not feel like it helped. She is sleeping better at night with [place] but still has anxiety through the day and does not know if something could help with that. She denies any suicidal ideations or thoughts of death. She denies any panic attacks but does have her anxiety intermittently build up to the point where it affects her.  Multiple atypical nevi Patient has multiplying nevi covering her body along with sun spots and wants to get them checked and would like a referral to dermatology for them.  Relevant past medical, surgical, family and social history reviewed and updated as indicated. Interim medical history since our last visit reviewed. Allergies and medications reviewed and updated.  Review of Systems  Constitutional: Negative for chills and fever.  HENT: Negative for congestion, ear discharge and ear pain.   Eyes: Negative for redness and visual disturbance.  Respiratory: Negative for chest tightness and shortness of breath.   Cardiovascular: Negative for chest pain and leg swelling.  Genitourinary: Negative for difficulty urinating and dysuria.  Musculoskeletal: Negative for back pain and gait problem.  Skin: Negative for rash.  Neurological: Negative for light-headedness and headaches.  Psychiatric/Behavioral: Negative for agitation, behavioral problems, decreased concentration, dysphoric mood, self-injury, sleep disturbance and  suicidal ideas. The patient is nervous/anxious.   All other systems reviewed and are negative.   Per HPI unless specifically indicated above     Medication List       Accurate as of 09/10/15  5:04 PM. Always use your most recent med list.          albuterol-ipratropium 18-103 MCG/ACT inhaler Commonly known as:  COMBIVENT Inhale 1 puff into the lungs 4 (four) times daily.   COMBIVENT RESPIMAT 20-100 MCG/ACT Aers respimat Generic drug:  Ipratropium-Albuterol   alendronate 70 MG tablet Commonly known as:  FOSAMAX Take 1 tablet (70 mg total) by mouth every 7 (seven) days. Take with a full glass of water on an empty stomach.   amitriptyline 25 MG tablet Commonly known as:  ELAVIL Take 1 tablet (25 mg total) by mouth at bedtime.   buPROPion 150 MG 24 hr tablet Commonly known as:  WELLBUTRIN XL Take 1 tablet (150 mg total) by mouth daily.   HYDROcodone-acetaminophen 5-325 MG tablet Commonly known as:  NORCO/VICODIN Reported on 07/05/2015   ibuprofen 800 MG tablet Commonly known as:  ADVIL,MOTRIN Take 1 tablet (800 mg total) by mouth every 8 (eight) hours as needed.   mometasone-formoterol 100-5 MCG/ACT Aero Commonly known as:  DULERA Inhale 2 puffs into the lungs 2 (two) times daily.   [place] DM PO Take by mouth.   omeprazole 20 MG capsule Commonly known as:  PRILOSEC Take 1 capsule (20 mg total) by mouth daily. Before meal   PROVENTIL HFA 108 (90 Base) MCG/ACT inhaler Generic drug:  albuterol   albuterol (2.5 MG/3ML) 0.083% nebulizer solution Commonly known as:  PROVENTIL Take 3 mLs (2.5 mg total) by nebulization every 6 (six) hours as needed for wheezing or shortness of breath.          Objective:    BP 139/74   Pulse 83   Temp 98 F (36.7 C) (Oral)   Ht 5' 5.5" (1.664 m)   Wt 251 lb (113.9 kg)   BMI 41.13 kg/m   Wt Readings from Last 3 Encounters:  09/10/15 251 lb (113.9 kg)  07/05/15 251 lb (113.9 kg)  06/11/15 264 lb (119.7 kg)    Physical  Exam  Constitutional: She is oriented to person, place, and time. She appears well-developed and well-nourished. No distress.  Eyes: Conjunctivae are normal.  Neck: Neck supple. No thyromegaly present.  Cardiovascular: Normal rate, regular rhythm, normal heart sounds and intact distal pulses.   No murmur heard. Pulmonary/Chest: Effort normal and breath sounds normal. No respiratory distress. She has no wheezes.  Musculoskeletal: Normal range of motion. She exhibits no edema or tenderness.  Lymphadenopathy:    She has no cervical adenopathy.  Neurological: She is alert and oriented to person, place, and time. Coordination normal.  Skin: Skin is warm and dry. Lesion (Multiple nevi and some spots spread out over whole body.) noted. She is not diaphoretic.  Psychiatric: Her behavior is normal. Judgment and thought content normal. Her mood appears anxious. She exhibits a depressed mood. She expresses no suicidal ideation. She expresses no suicidal plans.  Nursing note and vitals reviewed.     Assessment & Plan:   Problem List Items Addressed This Visit      Other   Anxiety and depression - Primary   Relevant Medications   buPROPion (WELLBUTRIN XL) 150 MG 24 hr tablet   amitriptyline (ELAVIL) 25 MG tablet    Other Visit Diagnoses    Multiple nevi       Relevant Orders   Ambulatory referral to Dermatology       Follow up plan: Return in about 4 weeks (around 10/08/2015), or if symptoms worsen or fail to improve, for Recheck anxiety.  Counseling provided for all of the vaccine components Orders Placed This Encounter  Procedures  . Ambulatory referral to Dermatology    Caryl Pina, MD [facility] 09/10/2015, 5:05 PM

## 2015-10-06 ENCOUNTER — Other Ambulatory Visit: Payer: Self-pay | Admitting: Family Medicine

## 2015-10-11 ENCOUNTER — Ambulatory Visit: Payer: Medicare Other | Admitting: Family Medicine

## 2015-10-14 ENCOUNTER — Ambulatory Visit (INDEPENDENT_AMBULATORY_CARE_PROVIDER_SITE_OTHER): Payer: Medicare Other | Admitting: Family Medicine

## 2015-10-14 ENCOUNTER — Encounter: Payer: Self-pay | Admitting: Family Medicine

## 2015-10-14 VITALS — BP 137/81 | HR 96 | Temp 97.4°F | Ht 65.5 in | Wt 250.0 lb

## 2015-10-14 DIAGNOSIS — J439 Emphysema, unspecified: Secondary | ICD-10-CM

## 2015-10-14 DIAGNOSIS — F419 Anxiety disorder, unspecified: Principal | ICD-10-CM

## 2015-10-14 DIAGNOSIS — F329 Major depressive disorder, single episode, unspecified: Secondary | ICD-10-CM

## 2015-10-14 DIAGNOSIS — F418 Other specified anxiety disorders: Secondary | ICD-10-CM | POA: Diagnosis not present

## 2015-10-14 MED ORDER — ALBUTEROL SULFATE (2.5 MG/3ML) 0.083% IN NEBU
2.5000 mg | INHALATION_SOLUTION | Freq: Four times a day (QID) | RESPIRATORY_TRACT | 2 refills | Status: DC | PRN
Start: 2015-10-14 — End: 2016-03-23

## 2015-10-14 MED ORDER — METHYLPREDNISOLONE ACETATE 80 MG/ML IJ SUSP
80.0000 mg | Freq: Once | INTRAMUSCULAR | Status: AC
  Administered 2015-10-14: 80 mg via INTRAMUSCULAR

## 2015-10-14 MED ORDER — PROVENTIL HFA 108 (90 BASE) MCG/ACT IN AERS: 1.0000 | INHALATION_SPRAY | RESPIRATORY_TRACT | 3 refills | Status: DC | PRN

## 2015-10-14 NOTE — Progress Notes (Signed)
BP 137/81   Pulse 96   Temp 97.4 F (36.3 C) (Oral)   Ht 5' 5.5" (1.664 m)   Wt 250 lb (113.4 kg)   BMI 40.97 kg/m    Subjective:    Patient ID: Tracy Neal, female    DOB: 05/21/1955, 60 y.o.   MRN: [id number]  HPI: Tracy Neal is a 60 y.o. female presenting on 10/14/2015 for Anxiety and depression (followup) and Medication Refill (albuterol inhaler)   HPI Anxiety and depression recheck Patient is coming in for recheck on her anxiety and depression today. She has been on Elavil 25 mg at bedtime and Wellbutrin 150 mg twice a day. She is very satisfied with these medications and the like they're helping her greatly. She denies any major side effects from medications. She is sleeping a lot better than she was before and her mood has been a lot better and her familial situation is improving which was a lot of her stressors.  COPD refill Patient is coming in today for refill of her COPD medication. She is currently on albuterol and has had to use it maybe once a week or once every other week. She stated refill because her old one is getting out of date. She feels like her breathing has been doing very well and she denies any wheezing or coughing episodes. She denies any shortness of breath or fevers or chills.  Relevant past medical, surgical, family and social history reviewed and updated as indicated. Interim medical history since our last visit reviewed. Allergies and medications reviewed and updated.  Review of Systems  Constitutional: Negative for chills and fever.  HENT: Negative for congestion, ear discharge and ear pain.   Eyes: Negative for redness and visual disturbance.  Respiratory: Negative for cough, chest tightness and shortness of breath.   Cardiovascular: Negative for chest pain and leg swelling.  Genitourinary: Negative for difficulty urinating and dysuria.  Musculoskeletal: Negative for back pain and gait problem.  Skin: Negative for rash.  Neurological:  Negative for light-headedness and headaches.  Psychiatric/Behavioral: Positive for dysphoric mood. Negative for agitation, behavioral problems, self-injury, sleep disturbance and suicidal ideas. The patient is nervous/anxious.   All other systems reviewed and are negative.   Per HPI unless specifically indicated above     Medication List       Accurate as of 10/14/15 11:35 AM. Always use your most recent med list.          albuterol-ipratropium 18-103 MCG/ACT inhaler Commonly known as:  COMBIVENT Inhale 1 puff into the lungs 4 (four) times daily.   COMBIVENT RESPIMAT 20-100 MCG/ACT Aers respimat Generic drug:  Ipratropium-Albuterol   alendronate 70 MG tablet Commonly known as:  FOSAMAX Take 1 tablet (70 mg total) by mouth every 7 (seven) days. Take with a full glass of water on an empty stomach.   amitriptyline 25 MG tablet Commonly known as:  ELAVIL Take 1 tablet (25 mg total) by mouth at bedtime.   buPROPion 150 MG 24 hr tablet Commonly known as:  WELLBUTRIN XL Take 1 tablet (150 mg total) by mouth daily.   HYDROcodone-acetaminophen 5-325 MG tablet Commonly known as:  NORCO/VICODIN Reported on 07/05/2015   ibuprofen 800 MG tablet Commonly known as:  ADVIL,MOTRIN TAKE ONE TABLET BY MOUTH EVERY 8 HOURS AS NEEDED   mometasone-formoterol 100-5 MCG/ACT Aero Commonly known as:  DULERA Inhale 2 puffs into the lungs 2 (two) times daily.   [place] DM PO Take by mouth.   omeprazole 20 MG  capsule Commonly known as:  PRILOSEC Take 1 capsule (20 mg total) by mouth daily. Before meal   PROVENTIL HFA 108 (90 Base) MCG/ACT inhaler Generic drug:  albuterol Inhale 1-2 puffs into the lungs every 4 (four) hours as needed for wheezing or shortness of breath.   albuterol (2.5 MG/3ML) 0.083% nebulizer solution Commonly known as:  PROVENTIL Take 3 mLs (2.5 mg total) by nebulization every 6 (six) hours as needed for wheezing or shortness of breath.          Objective:      BP 137/81   Pulse 96   Temp 97.4 F (36.3 C) (Oral)   Ht 5' 5.5" (1.664 m)   Wt 250 lb (113.4 kg)   BMI 40.97 kg/m   Wt Readings from Last 3 Encounters:  10/14/15 250 lb (113.4 kg)  09/10/15 251 lb (113.9 kg)  07/05/15 251 lb (113.9 kg)    Physical Exam  Constitutional: She is oriented to person, place, and time. She appears well-developed and well-nourished. No distress.  HENT:  Right Ear: External ear normal.  Left Ear: External ear normal.  Nose: Nose normal.  Mouth/Throat: Oropharynx is clear and moist. No oropharyngeal exudate.  Eyes: Conjunctivae are normal.  Neck: Neck supple. No thyromegaly present.  Cardiovascular: Normal rate, regular rhythm, normal heart sounds and intact distal pulses.   No murmur heard. Pulmonary/Chest: Effort normal and breath sounds normal. No respiratory distress. She has no wheezes.  Musculoskeletal: Normal range of motion. She exhibits no edema or tenderness.  Lymphadenopathy:    She has no cervical adenopathy.  Neurological: She is alert and oriented to person, place, and time. Coordination normal.  Skin: Skin is warm and dry. No rash noted. She is not diaphoretic.  Psychiatric: Her behavior is normal. Thought content normal. Her mood appears anxious. She exhibits a depressed mood. She expresses no suicidal ideation. She expresses no suicidal plans.  Nursing note and vitals reviewed.      Assessment & Plan:   Problem List Items Addressed This Visit      Respiratory   COPD (chronic obstructive pulmonary disease) ([place])   Relevant Medications   PROVENTIL HFA 108 (90 Base) MCG/ACT inhaler   albuterol (PROVENTIL) (2.5 MG/3ML) 0.083% nebulizer solution   methylPREDNISolone acetate (DEPO-MEDROL) injection 80 mg (Completed)     Other   Anxiety and depression - Primary    Other Visit Diagnoses   None.      Follow up plan: Return in about 3 months (around 01/13/2016), or if symptoms worsen or fail to improve, for Recheck anxiety and  depression and COPD.  Counseling provided for all of the vaccine components No orders of the defined types were placed in this encounter.   Caryl Pina, MD [facility] 10/14/2015, 11:35 AM

## 2015-10-20 ENCOUNTER — Telehealth: Payer: Self-pay

## 2015-10-20 NOTE — Telephone Encounter (Signed)
Go ahead and send Ventolin her pro-air, when I send a goes his albuterol so I don't know why they can just change this we've had this problem before.

## 2015-10-21 NOTE — Telephone Encounter (Signed)
Pharmacy notified okay to change to Ventolin

## 2015-10-24 ENCOUNTER — Other Ambulatory Visit: Payer: Self-pay | Admitting: Family Medicine

## 2015-10-28 ENCOUNTER — Other Ambulatory Visit: Payer: Self-pay | Admitting: Orthopedic Surgery

## 2015-10-28 ENCOUNTER — Other Ambulatory Visit (INDEPENDENT_AMBULATORY_CARE_PROVIDER_SITE_OTHER): Payer: Medicare Other

## 2015-10-28 ENCOUNTER — Ambulatory Visit (INDEPENDENT_AMBULATORY_CARE_PROVIDER_SITE_OTHER): Payer: Medicare Other

## 2015-10-28 DIAGNOSIS — M1712 Unilateral primary osteoarthritis, left knee: Secondary | ICD-10-CM | POA: Diagnosis not present

## 2015-10-28 DIAGNOSIS — M25561 Pain in right knee: Secondary | ICD-10-CM | POA: Diagnosis not present

## 2015-10-28 DIAGNOSIS — R52 Pain, unspecified: Secondary | ICD-10-CM

## 2015-10-28 DIAGNOSIS — M25562 Pain in left knee: Secondary | ICD-10-CM | POA: Diagnosis not present

## 2015-11-18 ENCOUNTER — Other Ambulatory Visit: Payer: Self-pay | Admitting: Family Medicine

## 2015-11-18 DIAGNOSIS — F32A Depression, unspecified: Secondary | ICD-10-CM

## 2015-11-18 DIAGNOSIS — F419 Anxiety disorder, unspecified: Principal | ICD-10-CM

## 2015-11-18 DIAGNOSIS — F329 Major depressive disorder, single episode, unspecified: Secondary | ICD-10-CM

## 2015-11-25 ENCOUNTER — Other Ambulatory Visit: Payer: Self-pay | Admitting: Family Medicine

## 2015-12-13 ENCOUNTER — Telehealth: Payer: Self-pay | Admitting: *Deleted

## 2015-12-13 NOTE — Progress Notes (Signed)
  Oncology Nurse Navigator Documentation  Navigator Location: [facility] (12/13/15 1623)   )Navigator Encounter Type: Telephone (12/13/15 1623)  I called patient to check in.  She reports that she is doing well.  Her only complaint is occasional heartburn secondary to her hiatal hernia.  She has medication she takes.  We discussed eating smaller and more frequent meals.  She denies any questions or concerns at this time.  I encouraged her to call me for any needs.                     Treatment Phase: Survivorship (12/13/15 1623) Barriers/Navigation Needs: No barriers at this time (12/13/15 1623)   Interventions: None required (12/13/15 1623)                      Time Spent with Patient: 15 (12/13/15 1623)

## 2015-12-28 ENCOUNTER — Other Ambulatory Visit: Payer: Self-pay | Admitting: Family Medicine

## 2015-12-28 DIAGNOSIS — F32A Depression, unspecified: Secondary | ICD-10-CM

## 2015-12-28 DIAGNOSIS — F419 Anxiety disorder, unspecified: Principal | ICD-10-CM

## 2015-12-28 DIAGNOSIS — F329 Major depressive disorder, single episode, unspecified: Secondary | ICD-10-CM

## 2016-01-14 ENCOUNTER — Ambulatory Visit: Payer: Medicare Other | Admitting: Family Medicine

## 2016-01-20 ENCOUNTER — Ambulatory Visit: Payer: Medicare Other | Admitting: Family Medicine

## 2016-02-15 ENCOUNTER — Other Ambulatory Visit: Payer: Self-pay | Admitting: Family Medicine

## 2016-02-15 DIAGNOSIS — F419 Anxiety disorder, unspecified: Principal | ICD-10-CM

## 2016-02-15 DIAGNOSIS — F329 Major depressive disorder, single episode, unspecified: Secondary | ICD-10-CM

## 2016-03-21 ENCOUNTER — Other Ambulatory Visit: Payer: Self-pay | Admitting: Family Medicine

## 2016-03-21 DIAGNOSIS — F329 Major depressive disorder, single episode, unspecified: Secondary | ICD-10-CM

## 2016-03-21 DIAGNOSIS — F419 Anxiety disorder, unspecified: Principal | ICD-10-CM

## 2016-03-23 ENCOUNTER — Other Ambulatory Visit: Payer: Self-pay

## 2016-03-23 DIAGNOSIS — J439 Emphysema, unspecified: Secondary | ICD-10-CM

## 2016-03-23 MED ORDER — ALBUTEROL SULFATE (2.5 MG/3ML) 0.083% IN NEBU: 2.5000 mg | INHALATION_SOLUTION | Freq: Four times a day (QID) | RESPIRATORY_TRACT | 0 refills | Status: DC | PRN

## 2016-03-30 ENCOUNTER — Other Ambulatory Visit: Payer: Self-pay | Admitting: *Deleted

## 2016-03-30 DIAGNOSIS — J439 Emphysema, unspecified: Secondary | ICD-10-CM

## 2016-03-30 MED ORDER — ALBUTEROL SULFATE (2.5 MG/3ML) 0.083% IN NEBU: 2.5000 mg | INHALATION_SOLUTION | Freq: Four times a day (QID) | RESPIRATORY_TRACT | 0 refills | Status: DC | PRN

## 2016-04-25 ENCOUNTER — Other Ambulatory Visit: Payer: Self-pay | Admitting: Family Medicine

## 2016-04-25 DIAGNOSIS — F329 Major depressive disorder, single episode, unspecified: Secondary | ICD-10-CM

## 2016-04-25 DIAGNOSIS — F419 Anxiety disorder, unspecified: Principal | ICD-10-CM

## 2016-05-26 ENCOUNTER — Other Ambulatory Visit: Payer: Self-pay | Admitting: Family Medicine

## 2016-05-26 DIAGNOSIS — J439 Emphysema, unspecified: Secondary | ICD-10-CM

## 2016-06-09 ENCOUNTER — Encounter: Payer: Self-pay | Admitting: Family Medicine

## 2016-06-09 ENCOUNTER — Ambulatory Visit (INDEPENDENT_AMBULATORY_CARE_PROVIDER_SITE_OTHER): Payer: Medicare Other | Admitting: Family Medicine

## 2016-06-09 VITALS — BP 136/87 | HR 85 | Temp 97.4°F | Ht 65.5 in | Wt 259.0 lb

## 2016-06-09 DIAGNOSIS — F419 Anxiety disorder, unspecified: Secondary | ICD-10-CM

## 2016-06-09 DIAGNOSIS — Z114 Encounter for screening for human immunodeficiency virus [HIV]: Secondary | ICD-10-CM | POA: Diagnosis not present

## 2016-06-09 DIAGNOSIS — F329 Major depressive disorder, single episode, unspecified: Secondary | ICD-10-CM

## 2016-06-09 DIAGNOSIS — Z1159 Encounter for screening for other viral diseases: Secondary | ICD-10-CM

## 2016-06-09 DIAGNOSIS — J439 Emphysema, unspecified: Secondary | ICD-10-CM | POA: Diagnosis not present

## 2016-06-09 DIAGNOSIS — Z131 Encounter for screening for diabetes mellitus: Secondary | ICD-10-CM | POA: Diagnosis not present

## 2016-06-09 DIAGNOSIS — H538 Other visual disturbances: Secondary | ICD-10-CM

## 2016-06-09 MED ORDER — PROVENTIL HFA 108 (90 BASE) MCG/ACT IN AERS: 1.0000 | INHALATION_SPRAY | RESPIRATORY_TRACT | 3 refills | Status: DC | PRN

## 2016-06-09 MED ORDER — FLUTICASONE FUROATE-VILANTEROL 100-25 MCG/INH IN AEPB: 1.0000 | INHALATION_SPRAY | Freq: Every day | RESPIRATORY_TRACT | 6 refills | Status: DC

## 2016-06-09 MED ORDER — AMITRIPTYLINE HCL 25 MG PO TABS: 25.0000 mg | ORAL_TABLET | Freq: Every day | ORAL | 2 refills | Status: DC

## 2016-06-09 MED ORDER — BUPROPION HCL ER (XL) 300 MG PO TB24: 300.0000 mg | ORAL_TABLET | Freq: Every day | ORAL | 2 refills | Status: DC

## 2016-06-09 MED ORDER — OMEPRAZOLE 20 MG PO CPDR: DELAYED_RELEASE_CAPSULE | ORAL | 2 refills | Status: DC

## 2016-06-09 MED ORDER — ALENDRONATE SODIUM 70 MG PO TABS: 70.0000 mg | ORAL_TABLET | ORAL | 11 refills | Status: DC

## 2016-06-09 NOTE — Progress Notes (Signed)
BP 136/87   Pulse 85   Temp 97.4 F (36.3 C) (Oral)   Ht 5' 5.5" (1.664 m)   Wt 259 lb (117.5 kg)   BMI 42.44 kg/m    Subjective:    Patient ID: Tracy Neal, female    DOB: Dec 29, 1955, 61 y.o.   MRN: [id number]  HPI: Tracy Neal is a 61 y.o. female presenting on 06/09/2016 for Anxiety and depression (6 mo followup; son is not speaking to her and has upset her) and COPD   HPI Anxiety and depression recheck Patient is coming in today for anxiety and depression recheck. She says that she has not been doing as well recently because her son has been having a lot of issues and avoiding her because of his new girlfriend. She says the Wellbutrin still helps some but not as good as it was before like to try to increase the dose. She denies any side effects from the medication. She denies any suicidal ideations or thoughts of hurting himself.  COPD recheck Patient is working on her cigarette smoking and is down to 3 cigarettes a day from over a pack per day. She is using her albuterol inhalers but does not have a maintenance inhaler because she does not know why, because the pharmacy did not refill it for last time. She denies any major issues with it but is using the albuterol about 2-3 times daily and especially at night. She will wake up occasionally in the middle of night to use it. She has to do this about 2 times a week. She denies any fevers or chills. She denies any shortness of breath but does have significant wheezing and coughing spells. These happen about 2-3 times a week as well. Albuterol inhaler has been working fine for that.  Right eye blurred hazy Patient complains of her right eye being blurred hazy this been gradually increasing over the past few months to year. She says especially she knows it when she's driving and especially when she's driving at night. She feels like there's a cloud or positive over eyes but she can't read without.  Relevant past medical, surgical, family  and social history reviewed and updated as indicated. Interim medical history since our last visit reviewed. Allergies and medications reviewed and updated.  Review of Systems  Constitutional: Negative for chills and fever.  HENT: Negative for congestion, ear discharge, ear pain and sore throat.   Eyes: Positive for visual disturbance. Negative for redness.  Respiratory: Positive for cough and wheezing. Negative for chest tightness and shortness of breath.   Cardiovascular: Negative for chest pain and leg swelling.  Musculoskeletal: Negative for back pain and gait problem.  Skin: Negative for rash.  Neurological: Negative for light-headedness and headaches.  Psychiatric/Behavioral: Positive for dysphoric mood and sleep disturbance. Negative for agitation, behavioral problems, self-injury and suicidal ideas. The patient is nervous/anxious.   All other systems reviewed and are negative.   Per HPI unless specifically indicated above        Objective:    BP 136/87   Pulse 85   Temp 97.4 F (36.3 C) (Oral)   Ht 5' 5.5" (1.664 m)   Wt 259 lb (117.5 kg)   BMI 42.44 kg/m   Wt Readings from Last 3 Encounters:  06/09/16 259 lb (117.5 kg)  10/14/15 250 lb (113.4 kg)  09/10/15 251 lb (113.9 kg)    Physical Exam  Constitutional: She is oriented to person, place, and time. She appears well-developed and well-nourished.  No distress.  Eyes: Conjunctivae are normal.  Neck: Neck supple. No thyromegaly present.  Cardiovascular: Normal rate, regular rhythm, normal heart sounds and intact distal pulses.   No murmur heard. Pulmonary/Chest: Effort normal and breath sounds normal. No respiratory distress. She has no wheezes. She has no rales. She exhibits no tenderness.  Musculoskeletal: Normal range of motion. She exhibits no edema or tenderness.  Lymphadenopathy:    She has no cervical adenopathy.  Neurological: She is alert and oriented to person, place, and time. Coordination normal.    Skin: Skin is warm and dry. No rash noted. She is not diaphoretic.  Psychiatric: She has a normal mood and affect. Her behavior is normal.  Nursing note and vitals reviewed.  Discussed obesity and trying to get back into working on reducing dietary intake. Patient is up 9 pounds from previous.     Assessment & Plan:   Problem List Items Addressed This Visit      Respiratory   COPD (chronic obstructive pulmonary disease) ([place])   Relevant Medications   fluticasone furoate-vilanterol (BREO ELLIPTA) 100-25 MCG/INH AEPB   PROVENTIL HFA 108 (90 Base) MCG/ACT inhaler     Other   Anxiety and depression - Primary   Relevant Medications   buPROPion (WELLBUTRIN XL) 300 MG 24 hr tablet   amitriptyline (ELAVIL) 25 MG tablet   Morbid obesity (HCC)   Relevant Orders   Lipid panel   CMP14+EGFR    Other Visit Diagnoses    Screening for HIV without presence of risk factors       Relevant Orders   HIV antibody   Need for hepatitis C screening test       Relevant Orders   Hepatitis C antibody   Screening for diabetes mellitus       Relevant Orders   CMP14+EGFR   Blurred vision, right eye       Worse with driving, sounds like cataracts, referral to ophthalmology   Relevant Orders   Ambulatory referral to Ophthalmology       Follow up plan: Return in about 6 months (around 12/10/2016), or if symptoms worsen or fail to improve, for Follow-up anxiety and depression and COPD.  Counseling provided for all of the vaccine components Orders Placed This Encounter  Procedures  . Lipid panel  . CMP14+EGFR  . Hepatitis C antibody  . HIV antibody    Caryl Pina, MD [facility] 06/09/2016, 10:41 AM

## 2016-06-10 LAB — CMP14+EGFR
ALT: 23 IU/L (ref 0–32)
AST: 18 IU/L (ref 0–40)
Albumin/Globulin Ratio: 1.6 (ref 1.2–2.2)
Albumin: 4.4 g/dL (ref 3.6–4.8)
Alkaline Phosphatase: 91 IU/L (ref 39–117)
BUN/Creatinine Ratio: 23 (ref 12–28)
BUN: 16 mg/dL (ref 8–27)
Bilirubin Total: 0.3 mg/dL (ref 0.0–1.2)
CALCIUM: 9.6 mg/dL (ref 8.7–10.3)
CO2: 23 mmol/L (ref 18–29)
CREATININE: 0.71 mg/dL (ref 0.57–1.00)
Chloride: 99 mmol/L (ref 96–106)
GFR calc Af Amer: 106 mL/min/{1.73_m2} (ref >59)
GFR, EST NON AFRICAN AMERICAN: 92 mL/min/{1.73_m2} (ref >59)
GLOBULIN, TOTAL: 2.8 g/dL (ref 1.5–4.5)
GLUCOSE: 88 mg/dL (ref 65–99)
Potassium: 4.4 mmol/L (ref 3.5–5.2)
Sodium: 138 mmol/L (ref 134–144)
Total Protein: 7.2 g/dL (ref 6.0–8.5)

## 2016-06-10 LAB — LIPID PANEL
CHOL/HDL RATIO: 3 ratio (ref 0.0–4.4)
CHOLESTEROL TOTAL: 161 mg/dL (ref 100–199)
HDL: 53 mg/dL (ref >39)
LDL CALC: 77 mg/dL (ref 0–99)
Triglycerides: 153 mg/dL — ABNORMAL HIGH (ref 0–149)
VLDL Cholesterol Cal: 31 mg/dL (ref 5–40)

## 2016-06-10 LAB — HEPATITIS C ANTIBODY

## 2016-06-10 LAB — HIV ANTIBODY (ROUTINE TESTING W REFLEX): HIV SCREEN 4TH GENERATION: NONREACTIVE

## 2016-06-22 ENCOUNTER — Telehealth: Payer: Self-pay | Admitting: Family Medicine

## 2016-06-22 NOTE — Telephone Encounter (Signed)
Appt made for pap Received call from [employer]

## 2016-06-30 ENCOUNTER — Other Ambulatory Visit: Payer: Self-pay | Admitting: Family Medicine

## 2016-07-03 ENCOUNTER — Other Ambulatory Visit: Payer: Medicare Other | Admitting: Family Medicine

## 2016-08-22 ENCOUNTER — Other Ambulatory Visit: Payer: Self-pay | Admitting: *Deleted

## 2016-08-22 DIAGNOSIS — J439 Emphysema, unspecified: Secondary | ICD-10-CM

## 2016-08-22 MED ORDER — ALBUTEROL SULFATE (2.5 MG/3ML) 0.083% IN NEBU: 2.5000 mg | INHALATION_SOLUTION | Freq: Four times a day (QID) | RESPIRATORY_TRACT | 0 refills | Status: DC | PRN

## 2016-10-03 ENCOUNTER — Other Ambulatory Visit: Payer: Self-pay | Admitting: Family Medicine

## 2016-10-03 DIAGNOSIS — J439 Emphysema, unspecified: Secondary | ICD-10-CM

## 2016-11-19 ENCOUNTER — Other Ambulatory Visit: Payer: Self-pay | Admitting: Family Medicine

## 2017-01-01 ENCOUNTER — Other Ambulatory Visit: Payer: Self-pay | Admitting: Family Medicine

## 2017-01-01 DIAGNOSIS — F32A Depression, unspecified: Secondary | ICD-10-CM

## 2017-01-01 DIAGNOSIS — F329 Major depressive disorder, single episode, unspecified: Secondary | ICD-10-CM

## 2017-01-01 DIAGNOSIS — F419 Anxiety disorder, unspecified: Principal | ICD-10-CM

## 2017-01-02 NOTE — Telephone Encounter (Signed)
last seen 06/09/16  Dr [name]

## 2017-01-03 DIAGNOSIS — R05 Cough: Secondary | ICD-10-CM | POA: Diagnosis not present

## 2017-01-03 DIAGNOSIS — R6889 Other general symptoms and signs: Secondary | ICD-10-CM | POA: Diagnosis not present

## 2017-02-01 ENCOUNTER — Other Ambulatory Visit: Payer: Self-pay | Admitting: Family Medicine

## 2017-02-01 DIAGNOSIS — J439 Emphysema, unspecified: Secondary | ICD-10-CM

## 2017-02-15 ENCOUNTER — Encounter: Payer: Medicare Other | Admitting: Family Medicine

## 2017-02-20 ENCOUNTER — Ambulatory Visit (INDEPENDENT_AMBULATORY_CARE_PROVIDER_SITE_OTHER): Payer: Medicare Other | Admitting: Family Medicine

## 2017-02-20 ENCOUNTER — Encounter: Payer: Self-pay | Admitting: Family Medicine

## 2017-02-20 VITALS — BP 138/74 | HR 83 | Temp 97.8°F | Ht 65.5 in | Wt 256.0 lb

## 2017-02-20 DIAGNOSIS — H538 Other visual disturbances: Secondary | ICD-10-CM | POA: Diagnosis not present

## 2017-02-20 DIAGNOSIS — Z124 Encounter for screening for malignant neoplasm of cervix: Secondary | ICD-10-CM | POA: Diagnosis not present

## 2017-02-20 DIAGNOSIS — Z853 Personal history of malignant neoplasm of breast: Secondary | ICD-10-CM | POA: Diagnosis not present

## 2017-02-20 DIAGNOSIS — J439 Emphysema, unspecified: Secondary | ICD-10-CM | POA: Diagnosis not present

## 2017-02-20 NOTE — Progress Notes (Signed)
BP 138/74   Pulse 83   Temp 97.8 F (36.6 C) (Oral)   Ht 5' 5.5" (1.664 m)   Wt 256 lb (116.1 kg)   BMI 41.95 kg/m    Subjective:    Patient ID: Tracy Neal, female    DOB: June 18, 1955, 62 y.o.   MRN: [id number]  HPI: Tracy Neal is a 62 y.o. female presenting on 02/20/2017 for Gynecologic Exam   HPI Well woman exam and Pap smear Patient is coming in for well woman exam and Pap smear.  She is morbidly obese and knows that but has not been doing too much to change currently.  He denies any issues with breast lumps or nodules or discharge that she is noticed recently.  She denies any vaginal issues or pain or discharge.  She has been having some blurred vision recently and wants a referral to ophthalmology.  She says this is been gradual over the past few years but is been bothering her more over the past few months and she wants to get it checked out with an ophthalmologist.  Patient does have a history of breast cancer and needs a mammogram but needed to be a diagnostic mammogram because of that history.  COPD recheck Patient is coming in for a COPD recheck as well.  She is currently using albuterol and Brio Ellipta and says she has been doing better recently with this.  She denies cough currently but does have a mild chronic cough.  She denies any wheezing currently but does get it frequently about once to twice a week.  Relevant past medical, surgical, family and social history reviewed and updated as indicated. Interim medical history since our last visit reviewed. Allergies and medications reviewed and updated.  Review of Systems  Constitutional: Negative for chills and fever.  HENT: Negative for congestion, ear discharge and ear pain.   Eyes: Negative for redness and visual disturbance.  Respiratory: Positive for cough and wheezing. Negative for chest tightness and shortness of breath.   Cardiovascular: Negative for chest pain and leg swelling.  Genitourinary: Negative for  difficulty urinating, dysuria, pelvic pain, vaginal bleeding, vaginal discharge and vaginal pain.  Musculoskeletal: Negative for back pain and gait problem.  Skin: Negative for rash.  Neurological: Negative for light-headedness and headaches.  Psychiatric/Behavioral: Negative for agitation and behavioral problems.  All other systems reviewed and are negative.   Per HPI unless specifically indicated above   Allergies as of 02/20/2017      Reactions   Percocet [oxycodone-acetaminophen] Itching   And nausea      Medication List        Accurate as of 02/20/17 10:54 AM. Always use your most recent med list.          albuterol-ipratropium 18-103 MCG/ACT inhaler Commonly known as:  COMBIVENT Inhale 1 puff into the lungs 4 (four) times daily.   alendronate 70 MG tablet Commonly known as:  FOSAMAX Take 1 tablet (70 mg total) by mouth every 7 (seven) days. Take with a full glass of water on an empty stomach.   amitriptyline 25 MG tablet Commonly known as:  ELAVIL TAKE 1 TABLET BY MOUTH AT BEDTIME   fluticasone furoate-vilanterol 100-25 MCG/INH Aepb Commonly known as:  BREO ELLIPTA Inhale 1 puff into the lungs daily.   ibuprofen 800 MG tablet Commonly known as:  ADVIL,MOTRIN TAKE 1 TABLET BY MOUTH EVERY 8 HOURS AS NEEDED   Melatonin 3 MG Caps Take 3 mg by mouth at bedtime.  omeprazole 20 MG capsule Commonly known as:  PRILOSEC TAKE ONE CAPSULE BY MOUTH ONCE DAILY BEFORE A MEAL   PROVENTIL HFA 108 (90 Base) MCG/ACT inhaler Generic drug:  albuterol Inhale 1-2 puffs into the lungs every 4 (four) hours as needed for wheezing or shortness of breath.   albuterol (2.5 MG/3ML) 0.083% nebulizer solution Commonly known as:  PROVENTIL USE 1 VIAL IN NEBULIZER EVERY 6 HOURS AS NEEDED FOR WHEEZING FOR SHORTNESS OF BREATH          Objective:    BP 138/74   Pulse 83   Temp 97.8 F (36.6 C) (Oral)   Ht 5' 5.5" (1.664 m)   Wt 256 lb (116.1 kg)   BMI 41.95 kg/m   Wt Readings  from Last 3 Encounters:  02/20/17 256 lb (116.1 kg)  06/09/16 259 lb (117.5 kg)  10/14/15 250 lb (113.4 kg)    Physical Exam  Constitutional: She is oriented to person, place, and time. She appears well-developed and well-nourished. No distress.  Eyes: Conjunctivae are normal.  Neck: Neck supple. No thyromegaly present.  Cardiovascular: Normal rate, regular rhythm, normal heart sounds and intact distal pulses.  No murmur heard. Pulmonary/Chest: Effort normal and breath sounds normal. No respiratory distress. She has no wheezes. Right breast exhibits no inverted nipple, no mass, no nipple discharge, no skin change and no tenderness. Left breast exhibits no inverted nipple, no mass, no nipple discharge, no skin change and no tenderness. Breasts are symmetrical (Large pendulous breasts).  Abdominal: Soft. Bowel sounds are normal. She exhibits no distension. There is no tenderness. There is no rebound and no guarding.  Genitourinary: Vagina normal and uterus normal. No breast swelling, tenderness, discharge or bleeding. Pelvic exam was performed with patient supine. There is no rash or lesion on the right labia. There is no rash or lesion on the left labia. Uterus is not deviated, not enlarged, not fixed and not tender. Cervix exhibits no motion tenderness, no discharge and no friability. Right adnexum displays no mass and no tenderness. Left adnexum displays no mass and no tenderness.  Musculoskeletal: Normal range of motion. She exhibits no edema.  Lymphadenopathy:    She has no cervical adenopathy.    She has no axillary adenopathy.  Neurological: She is alert and oriented to person, place, and time. Coordination normal.  Skin: Skin is warm and dry. No rash noted. She is not diaphoretic.  Psychiatric: She has a normal mood and affect. Her behavior is normal.  Nursing note and vitals reviewed.   Results for orders placed or performed in visit on 06/09/16  Lipid panel  Result Value Ref Range     Cholesterol, Total 161 100 - 199 mg/dL   Triglycerides 153 (H) 0 - 149 mg/dL   HDL 53 >39 mg/dL   VLDL Cholesterol Cal 31 5 - 40 mg/dL   LDL Calculated 77 0 - 99 mg/dL   Chol/HDL Ratio 3.0 0.0 - 4.4 ratio  CMP14+EGFR  Result Value Ref Range   Glucose 88 65 - 99 mg/dL   BUN 16 8 - 27 mg/dL   Creatinine, Ser 0.71 0.57 - 1.00 mg/dL   GFR calc non Af Amer 92 >59 mL/min/1.73   GFR calc Af Amer 106 >59 mL/min/1.73   BUN/Creatinine Ratio 23 12 - 28   Sodium 138 134 - 144 mmol/L   Potassium 4.4 3.5 - 5.2 mmol/L   Chloride 99 96 - 106 mmol/L   CO2 23 18 - 29 mmol/L   Calcium  9.6 8.7 - 10.3 mg/dL   Total Protein 7.2 6.0 - 8.5 g/dL   Albumin 4.4 3.6 - 4.8 g/dL   Globulin, Total 2.8 1.5 - 4.5 g/dL   Albumin/Globulin Ratio 1.6 1.2 - 2.2   Bilirubin Total 0.3 0.0 - 1.2 mg/dL   Alkaline Phosphatase 91 39 - 117 IU/L   AST 18 0 - 40 IU/L   ALT 23 0 - 32 IU/L  Hepatitis C antibody  Result Value Ref Range   Hep C Virus Ab <0.1 0.0 - 0.9 s/co ratio  HIV antibody  Result Value Ref Range   HIV Screen 4th Generation wRfx Non Reactive Non Reactive      Assessment & Plan:   Problem List Items Addressed This Visit      Respiratory   COPD (chronic obstructive pulmonary disease) ([place])     Other   Morbid obesity ([place])    Other Visit Diagnoses    Pap smear for cervical cancer screening    -  Primary   Relevant Orders   Pap IG, rfx HPV all pth (Completed)   Blurred vision, bilateral       Relevant Orders   Ambulatory referral to Ophthalmology   History of breast cancer in female       Relevant Orders   MM DIAG BREAST TOMO BILATERAL       Follow up plan: Return in about 6 months (around 08/20/2017), or if symptoms worsen or fail to improve.  Counseling provided for all of the vaccine components Orders Placed This Encounter  Procedures  . MM DIAG BREAST TOMO BILATERAL  . Ambulatory referral to Ophthalmology    Caryl Pina, MD [facility] 02/20/2017,  10:54 AM

## 2017-02-21 LAB — PAP IG, RFX HPV ALL PTH: PAP SMEAR COMMENT: 0

## 2017-02-22 ENCOUNTER — Encounter: Payer: Self-pay | Admitting: *Deleted

## 2017-03-09 ENCOUNTER — Telehealth: Payer: Self-pay | Admitting: Family Medicine

## 2017-03-11 ENCOUNTER — Other Ambulatory Visit: Payer: Self-pay | Admitting: Family Medicine

## 2017-03-11 DIAGNOSIS — J439 Emphysema, unspecified: Secondary | ICD-10-CM

## 2017-03-13 DIAGNOSIS — R05 Cough: Secondary | ICD-10-CM | POA: Diagnosis not present

## 2017-03-13 DIAGNOSIS — J441 Chronic obstructive pulmonary disease with (acute) exacerbation: Secondary | ICD-10-CM | POA: Diagnosis not present

## 2017-03-13 DIAGNOSIS — J40 Bronchitis, not specified as acute or chronic: Secondary | ICD-10-CM | POA: Diagnosis not present

## 2017-03-21 ENCOUNTER — Telehealth: Payer: Self-pay | Admitting: Family Medicine

## 2017-03-21 NOTE — Telephone Encounter (Signed)
Pt aware Rx on providers desk to sign, then will fax to [facility]

## 2017-05-21 ENCOUNTER — Other Ambulatory Visit: Payer: Self-pay | Admitting: Family Medicine

## 2017-05-21 DIAGNOSIS — F329 Major depressive disorder, single episode, unspecified: Secondary | ICD-10-CM

## 2017-05-21 DIAGNOSIS — F32A Depression, unspecified: Secondary | ICD-10-CM

## 2017-05-21 DIAGNOSIS — F419 Anxiety disorder, unspecified: Principal | ICD-10-CM

## 2017-06-07 DIAGNOSIS — H2513 Age-related nuclear cataract, bilateral: Secondary | ICD-10-CM | POA: Diagnosis not present

## 2017-06-07 DIAGNOSIS — H02889 Meibomian gland dysfunction of unspecified eye, unspecified eyelid: Secondary | ICD-10-CM | POA: Diagnosis not present

## 2017-06-07 DIAGNOSIS — H5211 Myopia, right eye: Secondary | ICD-10-CM | POA: Diagnosis not present

## 2017-06-07 DIAGNOSIS — H5202 Hypermetropia, left eye: Secondary | ICD-10-CM | POA: Diagnosis not present

## 2017-07-12 DIAGNOSIS — H25813 Combined forms of age-related cataract, bilateral: Secondary | ICD-10-CM | POA: Diagnosis not present

## 2017-07-17 ENCOUNTER — Other Ambulatory Visit: Payer: Self-pay | Admitting: Family Medicine

## 2017-07-17 DIAGNOSIS — J439 Emphysema, unspecified: Secondary | ICD-10-CM

## 2017-07-18 NOTE — Telephone Encounter (Signed)
Last seen 02/20/17

## 2017-07-27 NOTE — Patient Instructions (Signed)
Tracy Neal  07/27/2017     @PREFPERIOPPHARMACY @   Your procedure is scheduled on Friday, 08/03/17.  Report to [name] at [phone] A.M.  Call this number if you have problems the morning of surgery:  [phone]   Remember:  Do not eat or drink after midnight.  You may drink clear liquids until midnight .  Clear liquids allowed are:                    Water, Juice (non-citric and without pulp), Carbonated beverages, Clear Tea, Black Coffee only, Plain Jell-O only, Gatorade and Plain Popsicles only    Take these medicines the morning of surgery with A SIP OF WATER omeprazole, albuterol, breo ellipta, atrovent. Please use all nebulizers and inhalers. Please bring your inhalers.    Do not wear jewelry, make-up or nail polish.  Do not wear lotions, powders, or perfumes, or deodorant.  Do not shave 48 hours prior to surgery.  Men may shave face and neck.  Do not bring valuables to the hospital.  [facility] is not responsible for any belongings or valuables.  Contacts, dentures or bridgework may not be worn into surgery.  Leave your suitcase in the car.  After surgery it may be brought to your room.  For patients admitted to the hospital, discharge time will be determined by your treatment team.  Patients discharged the day of surgery will not be allowed to drive home.   Name and phone number of your driver:   Family   Please read over the following fact sheets that you were given. Pain Booklet, Surgical Site Infection Prevention and Anesthesia Post-op Instructions      Cataract Surgery, Care After Refer to this sheet in the next few weeks. These instructions provide you with information about caring for yourself after your procedure. Your health care provider may also give you more specific instructions. Your treatment has been planned according to current medical practices, but problems sometimes occur. Call your health care provider if you have any problems or questions  after your procedure. What can I expect after the procedure? After the procedure, it is common to have:  Itching.  Discomfort.  Fluid discharge.  Sensitivity to light and to touch.  Bruising.  Follow these instructions at home: [place] your eye every day for signs of infection. Watch for: ? Redness, swelling, or pain. ? Fluid, blood, or pus. ? Warmth. ? Bad smell. Activity  Avoid strenuous activities, such as playing contact sports, for as long as told by your health care provider.  Do not drive or operate heavy machinery until your health care provider approves.  Do not bend or lift heavy objects. Bending increases pressure in the eye. You can walk, climb stairs, and do light household chores.  Ask your health care provider when you can return to work. If you work in a dusty environment, you may be advised to wear protective eyewear for a period of time. General instructions  Take or apply over-the-counter and prescription medicines only as told by your health care provider. This includes eye drops.  Do not touch or rub your eyes.  If you were given a protective shield, wear it as told by your health care provider. If you were not given a protective shield, wear sunglasses as told by your health care provider to protect your eyes.  Keep the area around your eye clean and dry. Avoid swimming or allowing water to hit  you directly in the face while showering until told by your health care provider. Keep soap and shampoo out of your eyes.  Do not put a contact lens into the affected eye or eyes until your health care provider approves.  Keep all follow-up visits as told by your health care provider. This is important. Contact a health care provider if:   You have increased bruising around your eye.  You have pain that is not helped with medicine.  You have a fever.  You have redness, swelling, or pain in your eye.  You have fluid, blood, or pus coming from  your incision.  Your vision gets worse. Get help right away if:  You have sudden vision loss. This information is not intended to replace advice given to you by your health care provider. Make sure you discuss any questions you have with your health care provider. Document Released: 07/22/2004 Document Revised: 05/13/2015 Document Reviewed: 11/12/2014 Elsevier Interactive Patient Education  2018 [employer]. Cataract A cataract is cloudiness on the lens of your eye. The lens is the clear part of your eye that is behind your iris and pupil. The lens focuses light on the retina, which lets you see clearly. When a lens becomes cloudy, vision may become blurry. The clouding can range from a tiny dot to complete cloudiness. As some cataracts develop, they make a person more nearsighted. Other cataracts increase glare. Cataracts can worsen over time, and sometimes the pupil can look white. Cataracts get bigger and they cloud more of the lens, making it difficult to see. Cataracts can affect one eye or both eyes. What are the causes? Most cataracts are associated with age-related eye changes. The eye lens is mostly made up of water and protein. Normally, this protein is arranged in a way that keeps the lens clear. Cataracts develop when protein begins to clump together over time. This clouds the lens and lets less light pass through to the retina, which causes blurry vision. What increases the risk? This condition is more likely to develop in people who:  Are 109 years of age or older.  Have diabetes.  Have high blood pressure.  Takecertain medicines, such as steroids or hormone replacement therapy.  Have had an eye injury.  Have or have had eye inflammation.  Have a family history of cataracts.  Smoke.  Drink alcohol heavily.  Are frequently exposed to sun or very strong light without eye protection.  Are obese.  Have been exposed to large amounts of radiation, lead, or other toxic  substances.  Have had eye surgery.  What are the signs or symptoms? The main symptom of a cataract is blurry vision. Your vision may change or get worse over time. Other symptoms include:  Increased glare.  Seeing a bright ring or halo around light.  Poor night vision.  Double vision in one eye.  Having trouble seeing, even while wearing contact lenses or glasses.  Seeing colors that appear faded.  Trouble telling the difference between blue and purple.  Needing frequent changes to your prescription glasses or contacts.  How is this diagnosed? This condition is diagnosed with a medical history and eye exam. You may need to see an eye specialist (optometrist or ophthalmologist). Your health care provider may enlarge (dilate) your pupils with eye drops to see the back of your eye more clearly and look for signs of cataracts or other damage. You may also have tests, including:  A visual acuity test. This uses a  chart to determine the smallest letters that you can see from a specific distance.  A slit-lamp exam. This uses a microscope to examine small sections of your eye for abnormalities.  Tonometry. This test measures the pressure of the fluid inside your eye.  How is this treated? Treatment depends on the stage of your cataract. For an early cataract, vision may improve by using different eyeglasses or stronger lighting. If that does not help your vision, surgery may be recommended to remove the cataract. If your health care provider thinks your cataract may be linked to any medicines that you are taking, he or she may change your medicines. Follow these instructions at home: Lifestyle  Use stronger or brighter lighting.  Consider using a magnifying glass for reading or other activities.  Become familiar with your surroundings. Having poor vision can put you at a greater risk for tripping, falling, or bumping into things.  Wear sunglasses and a hat if you are sensitive to  bright light or are having problems with glare.  Quit smoking if you smoke. If you need help quitting, talk with your health care provider. General instructions  If you are prescribed new eyeglasses, wear them as told by your health care provider.  Take over-the-counter and prescription medicines only as told by your health care provider. Do not change your medicines unless told by your health care provider.  Do not drive or operate heavy machinery if your vision is blurry, particularly at night.  Keep your blood sugar under control, if you have diabetes.  Keep all follow-up visits as told by your health care provider. This is important. Contact a health care provider if:  Your symptoms get worse.  Your vision affects your ability to perform daily activities.  You have new symptoms.  You have a fever. Get help right away if:  You have sudden vision loss.  You have redness, swelling, or increasing pain in your eye.  You develop a headache and sensitivity to light. This information is not intended to replace advice given to you by your health care provider. Make sure you discuss any questions you have with your health care provider. Document Released: 01/02/2005 Document Revised: 05/13/2015 Document Reviewed: 07/08/2014 Elsevier Interactive Patient Education  2018 [place] Anesthesia, Adult, Care After These instructions provide you with information about caring for yourself after your procedure. Your health care provider may also give you more specific instructions. Your treatment has been planned according to current medical practices, but problems sometimes occur. Call your health care provider if you have any problems or questions after your procedure. What can I expect after the procedure? After the procedure, it is common to have:  Vomiting.  A sore throat.  Mental slowness.  It is common to feel:  Nauseous.  Cold or  shivery.  Sleepy.  Tired.  Sore or achy, even in parts of your body where you did not have surgery.  Follow these instructions at home: For at least 24 hours after the procedure:  Do not: ? Participate in activities where you could fall or become injured. ? Drive. ? Use heavy machinery. ? Drink alcohol. ? Take sleeping pills or medicines that cause drowsiness. ? Make important decisions or sign legal documents. ? Take care of children on your own.  Rest. Eating and drinking  If you vomit, drink water, juice, or soup when you can drink without vomiting.  Drink enough fluid to keep your urine clear or pale yellow.  Make sure  you have little or no nausea before eating solid foods.  Follow the diet recommended by your health care provider. General instructions  Have a responsible adult stay with you until you are awake and alert.  Return to your normal activities as told by your health care provider. Ask your health care provider what activities are safe for you.  Take over-the-counter and prescription medicines only as told by your health care provider.  If you smoke, do not smoke without supervision.  Keep all follow-up visits as told by your health care provider. This is important. Contact a health care provider if:  You continue to have nausea or vomiting at home, and medicines are not helpful.  You cannot drink fluids or start eating again.  You cannot urinate after 8-12 hours.  You develop a skin rash.  You have fever.  You have increasing redness at the site of your procedure. Get help right away if:  You have difficulty breathing.  You have chest pain.  You have unexpected bleeding.  You feel that you are having a life-threatening or urgent problem. This information is not intended to replace advice given to you by your health care provider. Make sure you discuss any questions you have with your health care provider. Document Released: 04/10/2000  Document Revised: 06/07/2015 Document Reviewed: 12/17/2014 Elsevier Interactive Patient Education  2018 [place] Anesthesia is a term that refers to techniques, procedures, and medicines that help a person stay safe and comfortable during a medical procedure. Monitored anesthesia care, or sedation, is one type of anesthesia. Your anesthesia specialist may recommend sedation if you will be having a procedure that does not require you to be unconscious, such as:  Cataract surgery.  A dental procedure.  A biopsy.  A colonoscopy.  During the procedure, you may receive a medicine to help you relax (sedative). There are three levels of sedation:  Mild sedation. At this level, you may feel awake and relaxed. You will be able to follow directions.  Moderate sedation. At this level, you will be sleepy. You may not remember the procedure.  Deep sedation. At this level, you will be asleep. You will not remember the procedure.  The more medicine you are given, the deeper your level of sedation will be. Depending on how you respond to the procedure, the anesthesia specialist may change your level of sedation or the type of anesthesia to fit your needs. An anesthesia specialist will monitor you closely during the procedure. Let your health care provider know about:  Any allergies you have.  All medicines you are taking, including vitamins, herbs, eye drops, creams, and over-the-counter medicines.  Any use of steroids (by mouth or as a cream).  Any problems you or family members have had with sedatives and anesthetic medicines.  Any blood disorders you have.  Any surgeries you have had.  Any medical conditions you have, such as sleep apnea.  Whether you are pregnant or may be pregnant.  Any use of cigarettes, alcohol, or street drugs. What are the risks? Generally, this is a safe procedure. However, problems may occur, including:  Getting too much medicine  (oversedation).  Nausea.  Allergic reaction to medicines.  Trouble breathing. If this happens, a breathing tube may be used to help with breathing. It will be removed when you are awake and breathing on your own.  Heart trouble.  Lung trouble.  Before the procedure Staying hydrated Follow instructions from your health care provider about hydration,  which may include:  Up to 2 hours before the procedure - you may continue to drink clear liquids, such as water, clear fruit juice, black coffee, and plain tea.  Eating and drinking restrictions Follow instructions from your health care provider about eating and drinking, which may include:  8 hours before the procedure - stop eating heavy meals or foods such as meat, fried foods, or fatty foods.  6 hours before the procedure - stop eating light meals or foods, such as toast or cereal.  6 hours before the procedure - stop drinking milk or drinks that contain milk.  2 hours before the procedure - stop drinking clear liquids.  Medicines Ask your health care provider about:  Changing or stopping your regular medicines. This is especially important if you are taking diabetes medicines or blood thinners.  Taking medicines such as aspirin and ibuprofen. These medicines can thin your blood. Do not take these medicines before your procedure if your health care provider instructs you not to.  Tests and exams  You will have a physical exam.  You may have blood tests done to show: ? How well your kidneys and liver are working. ? How well your blood can clot.  General instructions  Plan to have someone take you home from the hospital or clinic.  If you will be going home right after the procedure, plan to have someone with you for 24 hours.  What happens during the procedure?  Your blood pressure, heart rate, breathing, level of pain and overall condition will be monitored.  An IV tube will be inserted into one of your  veins.  Your anesthesia specialist will give you medicines as needed to keep you comfortable during the procedure. This may mean changing the level of sedation.  The procedure will be performed. After the procedure  Your blood pressure, heart rate, breathing rate, and blood oxygen level will be monitored until the medicines you were given have worn off.  Do not drive for 24 hours if you received a sedative.  You may: ? Feel sleepy, clumsy, or nauseous. ? Feel forgetful about what happened after the procedure. ? Have a sore throat if you had a breathing tube during the procedure. ? Vomit. This information is not intended to replace advice given to you by your health care provider. Make sure you discuss any questions you have with your health care provider. Document Released: 09/28/2004 Document Revised: 06/11/2015 Document Reviewed: 04/25/2015 Elsevier Interactive Patient Education  [employer].

## 2017-07-30 ENCOUNTER — Other Ambulatory Visit: Payer: Self-pay

## 2017-07-30 ENCOUNTER — Encounter (HOSPITAL_COMMUNITY)
Admission: RE | Admit: 2017-07-30 | Discharge: 2017-07-30 | Disposition: A | Discharge status: Home / Self Care | Payer: Medicare Other | Source: Ambulatory Visit | Attending: Ophthalmology | Admitting: Ophthalmology

## 2017-07-30 ENCOUNTER — Encounter (HOSPITAL_COMMUNITY): Payer: Self-pay

## 2017-07-30 DIAGNOSIS — Z01810 Encounter for preprocedural cardiovascular examination: Secondary | ICD-10-CM | POA: Insufficient documentation

## 2017-07-30 DIAGNOSIS — Z01812 Encounter for preprocedural laboratory examination: Secondary | ICD-10-CM | POA: Insufficient documentation

## 2017-07-30 HISTORY — DX: Unspecified osteoarthritis, unspecified site: M19.90

## 2017-07-30 HISTORY — DX: Anxiety disorder, unspecified: F41.9

## 2017-07-30 LAB — BASIC METABOLIC PANEL
ANION GAP: 12 (ref 5–15)
BUN: 16 mg/dL (ref 8–23)
CHLORIDE: 103 mmol/L (ref 98–111)
CO2: 21 mmol/L — AB (ref 22–32)
Calcium: 9 mg/dL (ref 8.9–10.3)
Creatinine, Ser: 1.08 mg/dL — ABNORMAL HIGH (ref 0.44–1.00)
GFR calc non Af Amer: 53 mL/min — ABNORMAL LOW (ref >60)
Glucose, Bld: 124 mg/dL — ABNORMAL HIGH (ref 70–99)
Potassium: 4.7 mmol/L (ref 3.5–5.1)
Sodium: 136 mmol/L (ref 135–145)

## 2017-07-30 LAB — CBC WITH DIFFERENTIAL/PLATELET
BASOS ABS: 0.1 10*3/uL (ref 0.0–0.1)
BASOS PCT: 1 %
Eosinophils Absolute: 0.3 10*3/uL (ref 0.0–0.7)
Eosinophils Relative: 3 %
HEMATOCRIT: 48.4 % — AB (ref 36.0–46.0)
HEMOGLOBIN: 16.1 g/dL — AB (ref 12.0–15.0)
Lymphocytes Relative: 32 %
Lymphs Abs: 3.2 10*3/uL (ref 0.7–4.0)
MCH: 29.9 pg (ref 26.0–34.0)
MCHC: 33.3 g/dL (ref 30.0–36.0)
MCV: 89.8 fL (ref 78.0–100.0)
Monocytes Absolute: 1 10*3/uL (ref 0.1–1.0)
Monocytes Relative: 10 %
NEUTROS ABS: 5.5 10*3/uL (ref 1.7–7.7)
NEUTROS PCT: 54 %
Platelets: 272 10*3/uL (ref 150–400)
RBC: 5.39 MIL/uL — ABNORMAL HIGH (ref 3.87–5.11)
RDW: 14.4 % (ref 11.5–15.5)
WBC: 9.9 10*3/uL (ref 4.0–10.5)

## 2017-07-30 NOTE — Progress Notes (Signed)
   07/30/17 0817  OBSTRUCTIVE SLEEP APNEA  Have you ever been diagnosed with sleep apnea through a sleep study? No  Do you snore loudly (loud enough to be heard through closed doors)?  1  Do you often feel tired, fatigued, or sleepy during the daytime (such as falling asleep during driving or talking to someone)? 0  Has anyone observed you stop breathing during your sleep? 1  Do you have, or are you being treated for high blood pressure? 0  BMI more than 35 kg/m2? 1  Age > 50 (1-yes) 1  Neck circumference greater than:Female 16 inches or larger, Female 17inches or larger? 0  Female Gender (Yes=1) 0  Obstructive Sleep Apnea Score 4  Score 5 or greater  Results sent to PCP

## 2017-08-13 ENCOUNTER — Encounter (HOSPITAL_COMMUNITY)
Admission: RE | Admit: 2017-08-13 | Discharge: 2017-08-13 | Disposition: A | Discharge status: Home / Self Care | Payer: Medicare Other | Source: Ambulatory Visit | Attending: Ophthalmology | Admitting: Ophthalmology

## 2017-08-13 ENCOUNTER — Encounter (HOSPITAL_COMMUNITY): Payer: Self-pay

## 2017-08-13 DIAGNOSIS — M7522 Bicipital tendinitis, left shoulder: Secondary | ICD-10-CM | POA: Diagnosis not present

## 2017-08-29 ENCOUNTER — Encounter: Payer: Self-pay | Admitting: Family Medicine

## 2017-08-29 ENCOUNTER — Ambulatory Visit (INDEPENDENT_AMBULATORY_CARE_PROVIDER_SITE_OTHER): Payer: Medicare Other | Admitting: Family Medicine

## 2017-08-29 VITALS — BP 151/90 | HR 73 | Temp 97.8°F | Ht 67.0 in | Wt 246.8 lb

## 2017-08-29 DIAGNOSIS — S46912A Strain of unspecified muscle, fascia and tendon at shoulder and upper arm level, left arm, initial encounter: Secondary | ICD-10-CM | POA: Diagnosis not present

## 2017-08-29 DIAGNOSIS — Z716 Tobacco abuse counseling: Secondary | ICD-10-CM | POA: Diagnosis not present

## 2017-08-29 MED ORDER — DICLOFENAC SODIUM 1 % TD GEL: 2.0000 g | Freq: Four times a day (QID) | TRANSDERMAL | 1 refills | Status: DC

## 2017-08-29 MED ORDER — CYCLOBENZAPRINE HCL 10 MG PO TABS: 10.0000 mg | ORAL_TABLET | Freq: Every day | ORAL | 2 refills | Status: DC

## 2017-08-29 MED ORDER — NICOTINE 21 MG/24HR TD PT24: 21.0000 mg | MEDICATED_PATCH | Freq: Every day | TRANSDERMAL | 0 refills | Status: DC

## 2017-08-29 NOTE — Progress Notes (Signed)
BP (!) 151/90   Pulse 73   Temp 97.8 F (36.6 C) (Oral)   Ht 5\' 7"  (1.702 m)   Wt 246 lb 12.8 oz (111.9 kg)   BMI 38.65 kg/m    Subjective:    Patient ID: Tracy Neal, female    DOB: 13-Mar-1955, 62 y.o.   MRN: [id number]  HPI: Tracy Neal is a 62 y.o. female presenting on 08/29/2017 for Arm Pain (Left. x 3 weeks. Patient states she was seen at [facility] 7/29 and states that she sprained her arm in her sleep. Patient states that the pain has improved but it is still there and pain is worse at night.)   HPI Left arm pain for 3 weeks Patient comes in complaining of left arm pain and strain is been going on for 3 weeks.  She says she woke up in the middle the night with this and was trying to deal with the taking ibuprofen and icing but it did not seem to get better so then she went to the urgent care at [facility] rocking him on 08/13/2017 and was given a muscle relaxer and Toradol and felt like those were helping some but she still feels like is not fully improved.  Is not worsened any further but she feels like it still hurting her significantly and it will wake her up at night as well.  She denies any fevers or chills or numbness or weakness.  She says the pain hurts more when she lays on that side or when she does any over the head range of motion.  Relevant past medical, surgical, family and social history reviewed and updated as indicated. Interim medical history since our last visit reviewed. Allergies and medications reviewed and updated.  Review of Systems  Constitutional: Negative for chills and fever.  Eyes: Negative for redness and visual disturbance.  Respiratory: Negative for chest tightness and shortness of breath.   Cardiovascular: Negative for chest pain and leg swelling.  Musculoskeletal: Positive for arthralgias and myalgias. Negative for back pain, gait problem and joint swelling.  Skin: Negative for rash.  Neurological: Negative for light-headedness and  headaches.  Psychiatric/Behavioral: Negative for agitation and behavioral problems.  All other systems reviewed and are negative.   Per HPI unless specifically indicated above   Allergies as of 08/29/2017      Reactions   Percocet [oxycodone-acetaminophen] Itching   And nausea      Medication List        Accurate as of 08/29/17  4:32 PM. Always use your most recent med list.          albuterol (2.5 MG/3ML) 0.083% nebulizer solution Commonly known as:  PROVENTIL USE 1 VIAL IN NEBULIZER EVERY 6 HOURS AS NEEDED FOR WHEEZING FOR SHORTNESS OF BREATH   albuterol 108 (90 Base) MCG/ACT inhaler Commonly known as:  PROVENTIL HFA;VENTOLIN HFA INHALE 1 TO 2 PUFFS BY MOUTH EVERY 4 HOURS AS NEEDED FOR WHEEZING AND FOR SHORTNESS OF BREATH   amitriptyline 25 MG tablet Commonly known as:  ELAVIL TAKE 1 TABLET BY MOUTH AT BEDTIME   BREO ELLIPTA 100-25 MCG/INH Aepb Generic drug:  fluticasone furoate-vilanterol INHALE 1 PUFF BY MOUTH ONCE DAILY   cyclobenzaprine 10 MG tablet Commonly known as:  FLEXERIL Take 1 tablet (10 mg total) by mouth at bedtime.   diclofenac sodium 1 % Gel Commonly known as:  VOLTAREN Apply 2 g topically 4 (four) times daily.   ibuprofen 800 MG tablet Commonly known as:  ADVIL,MOTRIN TAKE 1 TABLET BY MOUTH EVERY 8 HOURS AS NEEDED   ipratropium 0.06 % nasal spray Commonly known as:  ATROVENT Place 1 spray into both nostrils daily as needed for rhinitis.   Melatonin 3 MG Caps Take 3 mg by mouth at bedtime.   nicotine 21 mg/24hr patch Commonly known as:  NICODERM CQ - dosed in mg/24 hours Place 1 patch (21 mg total) onto the skin daily.   omeprazole 20 MG capsule Commonly known as:  PRILOSEC TAKE 1 CAPSULE BY MOUTH ONCE DAILY BEFORE A MEAL          Objective:    BP (!) 151/90   Pulse 73   Temp 97.8 F (36.6 C) (Oral)   Ht 5\' 7"  (1.702 m)   Wt 246 lb 12.8 oz (111.9 kg)   BMI 38.65 kg/m   Wt Readings from Last 3 Encounters:  08/29/17 246 lb  12.8 oz (111.9 kg)  07/30/17 250 lb (113.4 kg)  02/20/17 256 lb (116.1 kg)    Physical Exam  Constitutional: She is oriented to person, place, and time. She appears well-developed and well-nourished. No distress.  Eyes: Conjunctivae are normal.  Musculoskeletal: Normal range of motion. She exhibits no edema.       Left shoulder: She exhibits tenderness. She exhibits normal range of motion, no bony tenderness, no deformity, normal pulse and normal strength.       Arms: Neurological: She is alert and oriented to person, place, and time. Coordination normal.  Skin: Skin is warm and dry. No rash noted. She is not diaphoretic.  Psychiatric: She has a normal mood and affect. Her behavior is normal.  Nursing note and vitals reviewed.       Assessment & Plan:   Problem List Items Addressed This Visit    None    Visit Diagnoses    Muscle strain of left upper arm, initial encounter    -  Primary   Relevant Medications   cyclobenzaprine (FLEXERIL) 10 MG tablet   diclofenac sodium (VOLTAREN) 1 % GEL   Other Relevant Orders   Ambulatory referral to Physical Therapy   Encounter for tobacco use cessation counseling       Relevant Medications   nicotine (NICODERM CQ - DOSED IN MG/24 HOURS) 21 mg/24hr patch     Likely muscle strain or sprain, will send to physical therapy and continue muscle relaxer and give topical Voltaren.  Patient is already taking ibuprofen.  Follow up plan: Return if symptoms worsen or fail to improve.  Counseling provided for all of the vaccine components Orders Placed This Encounter  Procedures  . Ambulatory referral to Physical Therapy    Caryl Pina, MD [facility] 08/29/2017, 4:32 PM

## 2017-09-03 ENCOUNTER — Other Ambulatory Visit: Payer: Self-pay | Admitting: Family Medicine

## 2017-09-03 DIAGNOSIS — J439 Emphysema, unspecified: Secondary | ICD-10-CM

## 2017-09-24 ENCOUNTER — Ambulatory Visit: Payer: Medicare Other | Admitting: *Deleted

## 2017-09-25 ENCOUNTER — Encounter: Payer: Self-pay | Admitting: Family Medicine

## 2017-10-01 ENCOUNTER — Encounter (HOSPITAL_COMMUNITY)
Admission: RE | Admit: 2017-10-01 | Discharge: 2017-10-01 | Disposition: A | Discharge status: Home / Self Care | Payer: Medicare Other | Source: Ambulatory Visit | Attending: Ophthalmology | Admitting: Ophthalmology

## 2017-10-03 ENCOUNTER — Encounter (HOSPITAL_COMMUNITY): Payer: Self-pay

## 2017-10-05 ENCOUNTER — Ambulatory Visit (HOSPITAL_COMMUNITY): Admission: RE | Admit: 2017-10-05 | Payer: Medicare Other | Source: Ambulatory Visit | Admitting: Ophthalmology

## 2017-10-05 ENCOUNTER — Encounter (HOSPITAL_COMMUNITY): Admission: RE | Payer: Self-pay | Source: Ambulatory Visit

## 2017-10-05 SURGERY — PHACOEMULSIFICATION, CATARACT, WITH IOL INSERTION
Anesthesia: Monitor Anesthesia Care | Laterality: Right

## 2017-10-22 ENCOUNTER — Other Ambulatory Visit: Payer: Self-pay | Admitting: Family Medicine

## 2017-10-22 DIAGNOSIS — J439 Emphysema, unspecified: Secondary | ICD-10-CM

## 2017-12-09 ENCOUNTER — Other Ambulatory Visit: Payer: Self-pay | Admitting: Family Medicine

## 2017-12-09 DIAGNOSIS — J439 Emphysema, unspecified: Secondary | ICD-10-CM

## 2017-12-10 DIAGNOSIS — J441 Chronic obstructive pulmonary disease with (acute) exacerbation: Secondary | ICD-10-CM | POA: Diagnosis not present

## 2017-12-10 DIAGNOSIS — J208 Acute bronchitis due to other specified organisms: Secondary | ICD-10-CM | POA: Diagnosis not present

## 2018-02-07 ENCOUNTER — Encounter: Payer: Self-pay | Admitting: Gastroenterology

## 2018-02-15 ENCOUNTER — Other Ambulatory Visit: Payer: Self-pay | Admitting: Family Medicine

## 2018-02-15 DIAGNOSIS — S46912A Strain of unspecified muscle, fascia and tendon at shoulder and upper arm level, left arm, initial encounter: Secondary | ICD-10-CM

## 2018-02-15 DIAGNOSIS — F329 Major depressive disorder, single episode, unspecified: Secondary | ICD-10-CM

## 2018-02-15 DIAGNOSIS — F419 Anxiety disorder, unspecified: Secondary | ICD-10-CM

## 2018-02-15 DIAGNOSIS — J439 Emphysema, unspecified: Secondary | ICD-10-CM

## 2018-02-15 NOTE — Telephone Encounter (Signed)
Last seen 08/29/17

## 2018-02-28 DIAGNOSIS — R05 Cough: Secondary | ICD-10-CM | POA: Diagnosis not present

## 2018-02-28 DIAGNOSIS — J441 Chronic obstructive pulmonary disease with (acute) exacerbation: Secondary | ICD-10-CM | POA: Diagnosis not present

## 2018-02-28 DIAGNOSIS — Z23 Encounter for immunization: Secondary | ICD-10-CM | POA: Diagnosis not present

## 2018-02-28 DIAGNOSIS — Z720 Tobacco use: Secondary | ICD-10-CM | POA: Diagnosis not present

## 2018-02-28 DIAGNOSIS — B9689 Other specified bacterial agents as the cause of diseases classified elsewhere: Secondary | ICD-10-CM | POA: Diagnosis not present

## 2018-02-28 DIAGNOSIS — J208 Acute bronchitis due to other specified organisms: Secondary | ICD-10-CM | POA: Diagnosis not present

## 2018-04-16 ENCOUNTER — Other Ambulatory Visit: Payer: Self-pay | Admitting: Family Medicine

## 2018-04-16 DIAGNOSIS — J439 Emphysema, unspecified: Secondary | ICD-10-CM

## 2018-04-16 DIAGNOSIS — F329 Major depressive disorder, single episode, unspecified: Secondary | ICD-10-CM

## 2018-04-16 DIAGNOSIS — F419 Anxiety disorder, unspecified: Secondary | ICD-10-CM

## 2018-04-17 NOTE — Telephone Encounter (Signed)
Last seen 08/29/17

## 2018-05-29 ENCOUNTER — Other Ambulatory Visit: Payer: Self-pay | Admitting: Family Medicine

## 2018-05-29 DIAGNOSIS — J439 Emphysema, unspecified: Secondary | ICD-10-CM

## 2018-05-29 DIAGNOSIS — S46912A Strain of unspecified muscle, fascia and tendon at shoulder and upper arm level, left arm, initial encounter: Secondary | ICD-10-CM

## 2018-05-29 NOTE — Telephone Encounter (Signed)
Message left , will need to schedule an appointment to be seen for refills.

## 2018-05-29 NOTE — Telephone Encounter (Signed)
Last seen 08/29/17 Needs appt for refills

## 2018-05-31 ENCOUNTER — Ambulatory Visit (INDEPENDENT_AMBULATORY_CARE_PROVIDER_SITE_OTHER): Payer: Medicare Other | Admitting: Family Medicine

## 2018-05-31 ENCOUNTER — Encounter: Payer: Self-pay | Admitting: Family Medicine

## 2018-05-31 ENCOUNTER — Other Ambulatory Visit: Payer: Self-pay

## 2018-05-31 DIAGNOSIS — M1711 Unilateral primary osteoarthritis, right knee: Secondary | ICD-10-CM

## 2018-05-31 DIAGNOSIS — K219 Gastro-esophageal reflux disease without esophagitis: Secondary | ICD-10-CM | POA: Diagnosis not present

## 2018-05-31 DIAGNOSIS — M1712 Unilateral primary osteoarthritis, left knee: Secondary | ICD-10-CM | POA: Diagnosis not present

## 2018-05-31 DIAGNOSIS — J439 Emphysema, unspecified: Secondary | ICD-10-CM

## 2018-05-31 MED ORDER — ESOMEPRAZOLE MAGNESIUM 40 MG PO CPDR: 40.0000 mg | DELAYED_RELEASE_CAPSULE | Freq: Every day | ORAL | 3 refills | Status: DC

## 2018-05-31 MED ORDER — TIOTROPIUM BROMIDE MONOHYDRATE 18 MCG IN CAPS: 18.0000 ug | ORAL_CAPSULE | Freq: Every day | RESPIRATORY_TRACT | 3 refills | Status: DC

## 2018-05-31 MED ORDER — ALBUTEROL SULFATE HFA 108 (90 BASE) MCG/ACT IN AERS: INHALATION_SPRAY | RESPIRATORY_TRACT | 0 refills | Status: DC

## 2018-05-31 MED ORDER — CYCLOBENZAPRINE HCL 10 MG PO TABS: 10.0000 mg | ORAL_TABLET | Freq: Every day | ORAL | 3 refills | Status: DC

## 2018-05-31 MED ORDER — BUDESONIDE-FORMOTEROL FUMARATE 160-4.5 MCG/ACT IN AERO: 2.0000 | INHALATION_SPRAY | Freq: Two times a day (BID) | RESPIRATORY_TRACT | 5 refills | Status: DC

## 2018-05-31 MED ORDER — ALBUTEROL SULFATE (2.5 MG/3ML) 0.083% IN NEBU: INHALATION_SOLUTION | RESPIRATORY_TRACT | 0 refills | Status: DC

## 2018-05-31 NOTE — Progress Notes (Signed)
Virtual Visit via telephone Note  I connected with Tracy Neal on 05/31/18 at 1128 by telephone and verified that I am speaking with the correct person using two identifiers. Tracy Neal is currently located at home and no other people are currently with her during visit. The provider, Fransisca Kaufmann Uilani Sanville, MD is located in their office at time of visit.  Call ended at 1149  I discussed the limitations, risks, security and privacy concerns of performing an evaluation and management service by telephone and the availability of in person appointments. I also discussed with the patient that there may be a patient responsible charge related to this service. The patient expressed understanding and agreed to proceed.   History and Present Illness: COPD Patient is coming in for COPD recheck today.  He is currently on breo and albuterol.  He has a mild chronic cough but denies any major coughing spells or wheezing spells.  SHe has daily nighttime symptoms per week and multiple daytime symptoms per week currently. Still smoking.  She has months of having wheezing and coughing and needing to use albuterol multiple times per day.  GERD Patient is currently on omeprazole.  She has been having some burning and upper abdominal pain that is been going on over the past few months and does not seem to be resolved unless she takes to omeprazole. She denies any blood in her stool or lightheadedness or dizziness. She has started to double up on omeprazole and would like to try to switch back to Nexium and see if her insurance will cover it again.  Patient has arthritis of both knees and wants to come in for injections in both knees.   No diagnosis found.  Outpatient Encounter Medications as of 05/31/2018  Medication Sig  . albuterol (PROVENTIL HFA;VENTOLIN HFA) 108 (90 Base) MCG/ACT inhaler INHALE 1 TO 2 PUFFS BY MOUTH EVERY 4 HOURS AS NEEDED FOR  WHEEZING  AND  FOR  SHORTNESS  OF  BREATH  .  albuterol (PROVENTIL) (2.5 MG/3ML) 0.083% nebulizer solution USE 1 VIAL IN NEBULIZER EVERY 6 HOURS AS NEEDED FOR WHEEZING FOR SHORTNESS OF BREATH  . amitriptyline (ELAVIL) 25 MG tablet TAKE 1 TABLET BY MOUTH AT BEDTIME  . BREO ELLIPTA 100-25 MCG/INH AEPB Inhale 1 puff by mouth once daily  . cyclobenzaprine (FLEXERIL) 10 MG tablet TAKE 1 TABLET BY MOUTH AT BEDTIME  . diclofenac sodium (VOLTAREN) 1 % GEL Apply 2 g topically 4 (four) times daily.  . fluticasone furoate-vilanterol (BREO ELLIPTA) 100-25 MCG/INH AEPB Inhale 1 puff into the lungs daily. (Needs to be seen before next refill)  . ibuprofen (ADVIL,MOTRIN) 800 MG tablet TAKE 1 TABLET BY MOUTH EVERY 8 HOURS AS NEEDED  . ibuprofen (ADVIL,MOTRIN) 800 MG tablet TAKE 1 TABLET BY MOUTH EVERY 8 HOURS AS NEEDED  . ipratropium (ATROVENT) 0.06 % nasal spray Place 1 spray into both nostrils daily as needed for rhinitis.  . Melatonin 3 MG CAPS Take 3 mg by mouth at bedtime.  . nicotine (NICODERM CQ - DOSED IN MG/24 HOURS) 21 mg/24hr patch Place 1 patch (21 mg total) onto the skin daily.  Marland Kitchen omeprazole (PRILOSEC) 20 MG capsule TAKE 1 CAPSULE BY MOUTH ONCE DAILY BEFORE A MEAL   No facility-administered encounter medications on file as of 05/31/2018.     Review of Systems  Constitutional: Negative for chills and fever.  HENT: Positive for congestion. Negative for sinus pressure, sinus pain, sneezing and sore throat.   Eyes: Negative for  visual disturbance.  Respiratory: Positive for cough, shortness of breath and wheezing. Negative for chest tightness.   Cardiovascular: Negative for chest pain and leg swelling.  Gastrointestinal: Positive for abdominal pain.  Musculoskeletal: Negative for back pain and gait problem.  Skin: Negative for rash.  Neurological: Negative for light-headedness and headaches.  Psychiatric/Behavioral: Negative for agitation and behavioral problems.  All other systems reviewed and are negative.   Observations/Objective:  Patient sounds comfortable on the phone and under no acute distress  Assessment and Plan: Problem List Items Addressed This Visit      Respiratory   COPD (chronic obstructive pulmonary disease) (HCC) - Primary   Relevant Medications   budesonide-formoterol (SYMBICORT) 160-4.5 MCG/ACT inhaler   tiotropium (SPIRIVA HANDIHALER) 18 MCG inhalation capsule   albuterol (VENTOLIN HFA) 108 (90 Base) MCG/ACT inhaler   albuterol (PROVENTIL) (2.5 MG/3ML) 0.083% nebulizer solution     Digestive   GERD (gastroesophageal reflux disease)   Relevant Medications   esomeprazole (NEXIUM) 40 MG capsule     Musculoskeletal and Integument   Osteoarthritis of right knee   Relevant Medications   cyclobenzaprine (FLEXERIL) 10 MG tablet   Osteoarthritis of left knee   Relevant Medications   cyclobenzaprine (FLEXERIL) 10 MG tablet      Switched omeprazole to nexium  Switched from breo to symbicort and added spiriva, will see if this does better, reinforced the need to quit smoking and she says is been harder she is trying her Follow Up Instructions:  Follow up in 3 months   I discussed the assessment and treatment plan with the patient. The patient was provided an opportunity to ask questions and all were answered. The patient agreed with the plan and demonstrated an understanding of the instructions.   The patient was advised to call back or seek an in-person evaluation if the symptoms worsen or if the condition fails to improve as anticipated.  The above assessment and management plan was discussed with the patient. The patient verbalized understanding of and has agreed to the management plan. Patient is aware to call the clinic if symptoms persist or worsen. Patient is aware when to return to the clinic for a follow-up visit. Patient educated on when it is appropriate to go to the emergency department.    I provided 21 minutes of non-face-to-face time during this encounter.    Worthy Rancher, MD

## 2018-06-03 ENCOUNTER — Other Ambulatory Visit: Payer: Self-pay | Admitting: Family Medicine

## 2018-06-04 ENCOUNTER — Telehealth: Payer: Self-pay

## 2018-06-04 DIAGNOSIS — J439 Emphysema, unspecified: Secondary | ICD-10-CM

## 2018-06-04 NOTE — Telephone Encounter (Signed)
FYI, insurance denied patient's Spiriva.

## 2018-06-05 MED ORDER — UMECLIDINIUM BROMIDE 62.5 MCG/INH IN AEPB: 1.0000 | INHALATION_SPRAY | Freq: Every day | RESPIRATORY_TRACT | 3 refills | Status: DC

## 2018-06-05 NOTE — Telephone Encounter (Signed)
Left message to please call our office. 

## 2018-06-05 NOTE — Telephone Encounter (Signed)
Did they give any alternatives that they do cover

## 2018-06-05 NOTE — Telephone Encounter (Signed)
Pt is rtn call, she is also requesting for dr dettinger to send in a mask with the hose to [facility] for her albuterol machine.

## 2018-06-05 NOTE — Telephone Encounter (Signed)
Please let the patient know that I sent in [place] because her insurance so that the Spiriva was not covered

## 2018-06-05 NOTE — Telephone Encounter (Signed)
I could find pediatric mask kit but not adult.  Please order supplies for nebulizer machine. Thanks

## 2018-06-05 NOTE — Telephone Encounter (Signed)
No they didn't

## 2018-06-05 NOTE — Telephone Encounter (Signed)
Pt notified we will fax RX

## 2018-06-05 NOTE — Telephone Encounter (Signed)
Patient the prescription will need to be faxed.

## 2018-06-12 ENCOUNTER — Other Ambulatory Visit: Payer: Self-pay

## 2018-06-13 ENCOUNTER — Ambulatory Visit: Payer: Medicare Other | Admitting: Family Medicine

## 2018-07-19 ENCOUNTER — Other Ambulatory Visit: Payer: Self-pay | Admitting: Family Medicine

## 2018-07-19 DIAGNOSIS — J439 Emphysema, unspecified: Secondary | ICD-10-CM

## 2018-08-09 ENCOUNTER — Other Ambulatory Visit: Payer: Self-pay | Admitting: Family Medicine

## 2018-08-09 DIAGNOSIS — F419 Anxiety disorder, unspecified: Secondary | ICD-10-CM

## 2018-08-09 DIAGNOSIS — J439 Emphysema, unspecified: Secondary | ICD-10-CM

## 2018-08-09 DIAGNOSIS — F329 Major depressive disorder, single episode, unspecified: Secondary | ICD-10-CM

## 2018-08-19 DIAGNOSIS — M25512 Pain in left shoulder: Secondary | ICD-10-CM | POA: Diagnosis not present

## 2018-08-19 DIAGNOSIS — M898X1 Other specified disorders of bone, shoulder: Secondary | ICD-10-CM | POA: Diagnosis not present

## 2018-08-19 DIAGNOSIS — R0789 Other chest pain: Secondary | ICD-10-CM | POA: Diagnosis not present

## 2018-08-19 DIAGNOSIS — M545 Low back pain: Secondary | ICD-10-CM | POA: Diagnosis not present

## 2018-09-20 ENCOUNTER — Ambulatory Visit (INDEPENDENT_AMBULATORY_CARE_PROVIDER_SITE_OTHER): Payer: Medicare Other

## 2018-09-20 ENCOUNTER — Ambulatory Visit (INDEPENDENT_AMBULATORY_CARE_PROVIDER_SITE_OTHER): Payer: Medicare Other | Admitting: Family Medicine

## 2018-09-20 ENCOUNTER — Encounter: Payer: Self-pay | Admitting: Family Medicine

## 2018-09-20 ENCOUNTER — Ambulatory Visit: Payer: Medicare Other

## 2018-09-20 ENCOUNTER — Other Ambulatory Visit: Payer: Medicare Other

## 2018-09-20 ENCOUNTER — Other Ambulatory Visit: Payer: Self-pay

## 2018-09-20 DIAGNOSIS — M25552 Pain in left hip: Secondary | ICD-10-CM | POA: Diagnosis not present

## 2018-09-20 DIAGNOSIS — F329 Major depressive disorder, single episode, unspecified: Secondary | ICD-10-CM

## 2018-09-20 DIAGNOSIS — F419 Anxiety disorder, unspecified: Secondary | ICD-10-CM | POA: Diagnosis not present

## 2018-09-20 DIAGNOSIS — M1612 Unilateral primary osteoarthritis, left hip: Secondary | ICD-10-CM | POA: Diagnosis not present

## 2018-09-20 MED ORDER — DICLOFENAC SODIUM 75 MG PO TBEC: 75.0000 mg | DELAYED_RELEASE_TABLET | Freq: Two times a day (BID) | ORAL | 1 refills | Status: DC

## 2018-09-20 MED ORDER — AMITRIPTYLINE HCL 25 MG PO TABS: 25.0000 mg | ORAL_TABLET | Freq: Every day | ORAL | 1 refills | Status: DC

## 2018-09-20 MED ORDER — INCRUSE ELLIPTA 62.5 MCG/INH IN AEPB: 1.0000 | INHALATION_SPRAY | Freq: Every day | RESPIRATORY_TRACT | 3 refills | Status: DC

## 2018-09-20 NOTE — Progress Notes (Signed)
Virtual Visit via telephone Note  I connected with Tracy Neal on 09/20/18 at 1034 by telephone and verified that I am speaking with the correct person using two identifiers. Tracy Neal is currently located at home and No other people are currently with her during visit. The provider, Fransisca Kaufmann Nash Bolls, MD is located in their office at time of visit.  Call ended at 1050  I discussed the limitations, risks, security and privacy concerns of performing an evaluation and management service by telephone and the availability of in person appointments. I also discussed with the patient that there may be a patient responsible charge related to this service. The patient expressed understanding and agreed to proceed.   History and Present Illness: Patient has significant pain in hip and pain goes down left leg.  She was in the urgent care 2 weeks ago and got a shot and it helped a little but it is back again. Patient says pain is 7/10 and worsens when she walks and sometimes gives way.  Steps are hard for her.   Patient takes amitriptyline and she has to use 2 sometimes but it still works and she denies any major side effects from it but she says that sometimes it does not help her sleep and sometimes it does.  Sometimes she takes 2.  No diagnosis found.  Outpatient Encounter Medications as of 09/20/2018  Medication Sig  . albuterol (PROVENTIL) (2.5 MG/3ML) 0.083% nebulizer solution USE 1 VIAL IN NEBULIZER EVERY 6 HOURS AS NEEDED FOR WHEEZING/SHORTNESS OF BREATH  . albuterol (VENTOLIN HFA) 108 (90 Base) MCG/ACT inhaler INHALE 1 TO 2 PUFFS BY MOUTH EVERY 4 HOURS AS NEEDED FOR WHEEZING AND FOR SHORTNESS OF BREATH  . amitriptyline (ELAVIL) 25 MG tablet Take 1 tablet (25 mg total) by mouth at bedtime. (Needs to be seen before next refill)  . budesonide-formoterol (SYMBICORT) 160-4.5 MCG/ACT inhaler Inhale 2 puffs into the lungs 2 (two) times daily.  . cyclobenzaprine (FLEXERIL) 10 MG tablet  Take 1 tablet (10 mg total) by mouth at bedtime.  . diclofenac sodium (VOLTAREN) 1 % GEL Apply 2 g topically 4 (four) times daily.  Marland Kitchen esomeprazole (NEXIUM) 40 MG capsule Take 1 capsule (40 mg total) by mouth daily.  Marland Kitchen ibuprofen (ADVIL) 800 MG tablet TAKE 1 TABLET BY MOUTH EVERY 8 HOURS AS NEEDED  . ipratropium (ATROVENT) 0.06 % nasal spray Place 1 spray into both nostrils daily as needed for rhinitis.  . Melatonin 3 MG CAPS Take 3 mg by mouth at bedtime.  . nicotine (NICODERM CQ - DOSED IN MG/24 HOURS) 21 mg/24hr patch Place 1 patch (21 mg total) onto the skin daily.  Marland Kitchen tiotropium (SPIRIVA HANDIHALER) 18 MCG inhalation capsule Place 1 capsule (18 mcg total) into inhaler and inhale daily.  Marland Kitchen umeclidinium bromide (INCRUSE ELLIPTA) 62.5 MCG/INH AEPB Inhale 1 puff into the lungs daily.   No facility-administered encounter medications on file as of 09/20/2018.     Review of Systems  Constitutional: Negative for chills and fever.  Eyes: Negative for visual disturbance.  Respiratory: Negative for chest tightness and shortness of breath.   Cardiovascular: Negative for chest pain and leg swelling.  Musculoskeletal: Positive for arthralgias. Negative for back pain and gait problem.  Skin: Negative for rash.  Neurological: Negative for light-headedness and headaches.  Psychiatric/Behavioral: Positive for sleep disturbance. Negative for agitation and behavioral problems.  All other systems reviewed and are negative.   Observations/Objective: Patient sounds comfortable and in no acute distress  Assessment and Plan: Problem List Items Addressed This Visit      Other   Anxiety and depression   Relevant Medications   amitriptyline (ELAVIL) 25 MG tablet    Other Visit Diagnoses    Primary osteoarthritis of left hip    -  Primary   Relevant Medications   diclofenac (VOLTAREN) 75 MG EC tablet   Other Relevant Orders   DG HIP UNILAT W OR W/O PELVIS 2-3 VIEWS LEFT       Follow Up Instructions:  Increased amitriptyline to where she can take 1 or 2 in the evening to help with sleep and anxiety.  Told her to stop the ibuprofen and start taking the Voltaren and come in for an x-ray in the office.  We will discuss after we have the x-ray.    I discussed the assessment and treatment plan with the patient. The patient was provided an opportunity to ask questions and all were answered. The patient agreed with the plan and demonstrated an understanding of the instructions.   The patient was advised to call back or seek an in-person evaluation if the symptoms worsen or if the condition fails to improve as anticipated.  The above assessment and management plan was discussed with the patient. The patient verbalized understanding of and has agreed to the management plan. Patient is aware to call the clinic if symptoms persist or worsen. Patient is aware when to return to the clinic for a follow-up visit. Patient educated on when it is appropriate to go to the emergency department.    I provided 16 minutes of non-face-to-face time during this encounter.    Worthy Rancher, MD

## 2018-09-24 ENCOUNTER — Telehealth: Payer: Self-pay | Admitting: Family Medicine

## 2018-09-24 NOTE — Telephone Encounter (Signed)
Pt aware by detailed VM = negative xray of hip

## 2018-11-04 ENCOUNTER — Encounter: Payer: Self-pay | Admitting: Family Medicine

## 2018-11-04 ENCOUNTER — Ambulatory Visit (INDEPENDENT_AMBULATORY_CARE_PROVIDER_SITE_OTHER): Payer: Medicare Other | Admitting: Family Medicine

## 2018-11-04 ENCOUNTER — Other Ambulatory Visit: Payer: Self-pay

## 2018-11-04 VITALS — BP 149/90 | HR 91 | Temp 96.8°F | Ht 67.0 in | Wt 283.4 lb

## 2018-11-04 DIAGNOSIS — M25562 Pain in left knee: Secondary | ICD-10-CM

## 2018-11-04 DIAGNOSIS — M25561 Pain in right knee: Secondary | ICD-10-CM

## 2018-11-04 DIAGNOSIS — M170 Bilateral primary osteoarthritis of knee: Secondary | ICD-10-CM | POA: Diagnosis not present

## 2018-11-04 MED ORDER — METHYLPREDNISOLONE ACETATE 80 MG/ML IJ SUSP
80.0000 mg | Freq: Once | INTRAMUSCULAR | Status: AC
  Administered 2018-11-04: 80 mg via INTRAMUSCULAR

## 2018-11-04 NOTE — Progress Notes (Signed)
BP (!) 149/90   Pulse 91   Temp (!) 96.8 F (36 C) (Temporal)   Ht 5\' 7"  (1.702 m)   Wt 283 lb 6.4 oz (128.5 kg)   SpO2 92%   BMI 44.39 kg/m    Subjective:   Patient ID: Tracy Neal, female    DOB: 01/17/1955, 63 y.o.   MRN: [id number]  HPI: Tracy Neal is a 63 y.o. female presenting on 11/04/2018 for Knee Pain (bilateral-ongoing)   HPI Bilateral knee pain osteoarthritis Patient is coming in with complaints of bilateral knee pain/osteoarthritis is been bothering her more over the past few months, she has been fighting this for years but it has been worse more recently.  She did have to move recently and she has stairs and she thinks that part of the reason is bothering her more.  Relevant past medical, surgical, family and social history reviewed and updated as indicated. Interim medical history since our last visit reviewed. Allergies and medications reviewed and updated.  Review of Systems  Constitutional: Negative for chills and fever.  Eyes: Negative for visual disturbance.  Respiratory: Negative for shortness of breath.   Cardiovascular: Negative for chest pain and leg swelling.  Musculoskeletal: Positive for arthralgias and joint swelling.  Skin: Negative for rash.  All other systems reviewed and are negative.   Per HPI unless specifically indicated above   Allergies as of 11/04/2018      Reactions   Percocet [oxycodone-acetaminophen] Itching   And nausea      Medication List       Accurate as of November 04, 2018 11:50 AM. If you have any questions, ask your nurse or doctor.        STOP taking these medications   cyclobenzaprine 10 MG tablet Commonly known as: FLEXERIL Stopped by: Fransisca Kaufmann [name], MD   diclofenac sodium 1 % Gel Commonly known as: Voltaren Stopped by: Worthy Rancher, MD   nicotine 21 mg/24hr patch Commonly known as: NICODERM CQ - dosed in mg/24 hours Stopped by: Fransisca Kaufmann [name], MD     TAKE these  medications   albuterol 108 (90 Base) MCG/ACT inhaler Commonly known as: VENTOLIN HFA INHALE 1 TO 2 PUFFS BY MOUTH EVERY 4 HOURS AS NEEDED FOR WHEEZING AND FOR SHORTNESS OF BREATH   albuterol (2.5 MG/3ML) 0.083% nebulizer solution Commonly known as: PROVENTIL USE 1 VIAL IN NEBULIZER EVERY 6 HOURS AS NEEDED FOR WHEEZING/SHORTNESS OF BREATH   amitriptyline 25 MG tablet Commonly known as: ELAVIL Take 1-2 tablets (25-50 mg total) by mouth at bedtime. (Needs to be seen before next refill)   budesonide-formoterol 160-4.5 MCG/ACT inhaler Commonly known as: SYMBICORT Inhale 2 puffs into the lungs 2 (two) times daily.   diclofenac 75 MG EC tablet Commonly known as: VOLTAREN Take 1 tablet (75 mg total) by mouth 2 (two) times daily.   esomeprazole 40 MG capsule Commonly known as: NexIUM Take 1 capsule (40 mg total) by mouth daily.   Incruse Ellipta 62.5 MCG/INH Aepb Generic drug: umeclidinium bromide Inhale 1 puff into the lungs daily.   ipratropium 0.06 % nasal spray Commonly known as: ATROVENT Place 1 spray into both nostrils daily as needed for rhinitis.   Melatonin 3 MG Caps Take 3 mg by mouth at bedtime.        Objective:   BP (!) 149/90   Pulse 91   Temp (!) 96.8 F (36 C) (Temporal)   Ht 5\' 7"  (1.702 m)   Wt 283  lb 6.4 oz (128.5 kg)   SpO2 92%   BMI 44.39 kg/m   Wt Readings from Last 3 Encounters:  11/04/18 283 lb 6.4 oz (128.5 kg)  08/29/17 246 lb 12.8 oz (111.9 kg)  07/30/17 250 lb (113.4 kg)    Physical Exam Vitals signs and nursing note reviewed.  Constitutional:      General: She is not in acute distress.    Appearance: She is well-developed. She is not diaphoretic.  Eyes:     Conjunctiva/sclera: Conjunctivae normal.  Musculoskeletal: Normal range of motion.     Right knee: She exhibits effusion. She exhibits normal range of motion. Tenderness found. Medial joint line and lateral joint line tenderness noted.     Left knee: She exhibits effusion. She  exhibits normal range of motion, no deformity and normal alignment. Tenderness found. Medial joint line and lateral joint line tenderness noted.  Skin:    General: Skin is warm and dry.     Findings: No rash.  Neurological:     Mental Status: She is alert and oriented to person, place, and time.     Coordination: Coordination normal.  Psychiatric:        Behavior: Behavior normal.     Knee bilateral injection: Consent form signed. Risk factors of bleeding and infection discussed with patient and patient is agreeable towards injection. Patient prepped with Betadine. Lateral approach towards injection used. Injected 80 mg of Depo-Medrol and 1 mL of 2% lidocaine. Patient tolerated procedure well and no side effects from noted. Minimal to no bleeding. Simple bandage applied after.   Assessment & Plan:   Problem List Items Addressed This Visit    None    Visit Diagnoses    Primary osteoarthritis of knees, bilateral    -  Primary   Relevant Medications   methylPREDNISolone acetate (DEPO-MEDROL) injection 80 mg (Completed)   methylPREDNISolone acetate (DEPO-MEDROL) injection 80 mg (Completed)      Patient already has follow-up in December on her usual issues Follow up plan: Return in about 6 weeks (around 12/16/2018), or if symptoms worsen or fail to improve, for Yearly exam and blood work.  Counseling provided for all of the vaccine components No orders of the defined types were placed in this encounter.   Caryl Pina, MD [facility] 11/04/2018, 11:50 AM

## 2018-11-11 ENCOUNTER — Telehealth: Payer: Self-pay

## 2018-11-11 NOTE — Telephone Encounter (Signed)
[facility] - left message.

## 2018-11-20 ENCOUNTER — Other Ambulatory Visit: Payer: Self-pay | Admitting: Family Medicine

## 2018-11-20 DIAGNOSIS — J439 Emphysema, unspecified: Secondary | ICD-10-CM

## 2018-11-27 ENCOUNTER — Telehealth: Payer: Self-pay | Admitting: Licensed Clinical Social Worker

## 2018-11-27 NOTE — Telephone Encounter (Signed)
[facility] attempted to make contact; phone number disconnected

## 2018-12-02 DIAGNOSIS — F411 Generalized anxiety disorder: Secondary | ICD-10-CM | POA: Diagnosis not present

## 2018-12-02 DIAGNOSIS — Z720 Tobacco use: Secondary | ICD-10-CM | POA: Diagnosis not present

## 2018-12-09 DIAGNOSIS — Z1159 Encounter for screening for other viral diseases: Secondary | ICD-10-CM | POA: Diagnosis not present

## 2018-12-20 ENCOUNTER — Ambulatory Visit: Payer: Self-pay | Admitting: Family Medicine

## 2018-12-20 ENCOUNTER — Encounter: Payer: Self-pay | Admitting: Family Medicine

## 2018-12-30 ENCOUNTER — Other Ambulatory Visit: Payer: Self-pay | Admitting: *Deleted

## 2018-12-30 DIAGNOSIS — M1612 Unilateral primary osteoarthritis, left hip: Secondary | ICD-10-CM

## 2018-12-30 DIAGNOSIS — J439 Emphysema, unspecified: Secondary | ICD-10-CM

## 2018-12-30 MED ORDER — ALBUTEROL SULFATE HFA 108 (90 BASE) MCG/ACT IN AERS: INHALATION_SPRAY | RESPIRATORY_TRACT | 0 refills | Status: DC

## 2018-12-30 MED ORDER — DICLOFENAC SODIUM 75 MG PO TBEC: 75.0000 mg | DELAYED_RELEASE_TABLET | Freq: Two times a day (BID) | ORAL | 0 refills | Status: DC

## 2018-12-30 NOTE — Addendum Note (Signed)
Addended by: Antonietta Barcelona D on: 12/30/2018 10:25 AM   Modules accepted: Orders

## 2019-01-01 ENCOUNTER — Other Ambulatory Visit: Payer: Self-pay | Admitting: Family Medicine

## 2019-01-01 DIAGNOSIS — J439 Emphysema, unspecified: Secondary | ICD-10-CM

## 2019-01-01 MED ORDER — NEBULIZER/TUBING/MOUTHPIECE KIT: PACK | 0 refills | Status: DC

## 2019-01-01 MED ORDER — ALBUTEROL SULFATE (2.5 MG/3ML) 0.083% IN NEBU: INHALATION_SOLUTION | RESPIRATORY_TRACT | 0 refills | Status: DC

## 2019-01-01 NOTE — Telephone Encounter (Signed)
What is the name of the medication? Albuterol (Proventil) 2.5 mg/50ml and also needs a hose for the machine. Switched pharmacy  Have you contacted your pharmacy to request a refill? Yes  Which pharmacy would you like this sent to? [facility] in [place]   Patient notified that their request is being sent to the clinical staff for review and that they should receive a call once it is complete. If they do not receive a call within 24 hours they can check with their pharmacy or our office.

## 2019-02-12 ENCOUNTER — Other Ambulatory Visit: Payer: Self-pay | Admitting: Family Medicine

## 2019-02-12 DIAGNOSIS — F329 Major depressive disorder, single episode, unspecified: Secondary | ICD-10-CM

## 2019-02-12 DIAGNOSIS — F419 Anxiety disorder, unspecified: Secondary | ICD-10-CM

## 2019-03-19 ENCOUNTER — Other Ambulatory Visit: Payer: Self-pay | Admitting: Family Medicine

## 2019-03-19 DIAGNOSIS — J439 Emphysema, unspecified: Secondary | ICD-10-CM

## 2019-03-19 DIAGNOSIS — F329 Major depressive disorder, single episode, unspecified: Secondary | ICD-10-CM

## 2019-03-19 DIAGNOSIS — F32A Depression, unspecified: Secondary | ICD-10-CM

## 2019-03-19 DIAGNOSIS — K219 Gastro-esophageal reflux disease without esophagitis: Secondary | ICD-10-CM

## 2019-03-19 NOTE — Telephone Encounter (Signed)
Last office visit 11/30/2018 Upcoming appointment 03/20/2019

## 2019-03-20 ENCOUNTER — Encounter: Payer: Self-pay | Admitting: Family Medicine

## 2019-03-20 ENCOUNTER — Ambulatory Visit (INDEPENDENT_AMBULATORY_CARE_PROVIDER_SITE_OTHER): Payer: Medicare Other

## 2019-03-20 ENCOUNTER — Ambulatory Visit (INDEPENDENT_AMBULATORY_CARE_PROVIDER_SITE_OTHER): Payer: Medicare Other | Admitting: Family Medicine

## 2019-03-20 ENCOUNTER — Other Ambulatory Visit: Payer: Self-pay

## 2019-03-20 VITALS — BP 146/89 | HR 93 | Temp 95.7°F | Ht 67.0 in | Wt 282.0 lb

## 2019-03-20 DIAGNOSIS — M25551 Pain in right hip: Secondary | ICD-10-CM | POA: Diagnosis not present

## 2019-03-20 DIAGNOSIS — B351 Tinea unguium: Secondary | ICD-10-CM

## 2019-03-20 MED ORDER — PREDNISONE 20 MG PO TABS: ORAL_TABLET | ORAL | 0 refills | Status: DC

## 2019-03-20 NOTE — Progress Notes (Signed)
BP (!) 146/89   Pulse 93   Temp (!) 95.7 F (35.4 C)   Ht 5' 7"  (1.702 m)   Wt 282 lb (127.9 kg)   SpO2 96%   BMI 44.17 kg/m    Subjective:   Patient ID: Tracy Neal, female    DOB: [birth year], 64 y.o.   MRN: [id number]  HPI: Tracy Neal is a 64 y.o. female presenting on 03/20/2019 for Leg Pain (right leg, toe nails on same foot are black. Pain radiates to groin)   HPI Patient comes in complaining of pain that is in her right groin that sometimes she gets going down the right anterior leg but most the time its in the right groin and down into her crotch.  She has been having this increasingly over the past few months.  She also complains that all of her toenails are thickened and discolored and almost appeared darker yellow to black.  She says is been going on for quite some time.  Relevant past medical, surgical, family and social history reviewed and updated as indicated. Interim medical history since our last visit reviewed. Allergies and medications reviewed and updated.  Review of Systems  Constitutional: Negative for chills and fever.  Eyes: Negative for visual disturbance.  Respiratory: Negative for chest tightness and shortness of breath.   Cardiovascular: Negative for chest pain and leg swelling.  Musculoskeletal: Positive for arthralgias. Negative for back pain and gait problem.  Skin: Negative for rash.  Neurological: Negative for light-headedness and headaches.  Psychiatric/Behavioral: Negative for agitation and behavioral problems.  All other systems reviewed and are negative.   Per HPI unless specifically indicated above   Allergies as of 03/20/2019      Reactions   Percocet [oxycodone-acetaminophen] Itching   And nausea   Oxycodone-acetaminophen Nausea And Vomiting, Nausea Only   And feels like bugs crawling on skin      Medication List       Accurate as of March 20, 2019  3:45 PM. If you have any questions, ask your nurse or doctor.        albuterol (2.5 MG/3ML) 0.083% nebulizer solution Commonly known as: PROVENTIL USE 1 VIAL IN NEBULIZER EVERY 6 HOURS AS NEEDED FOR WHEEZING/SHORTNESS OF BREATH   albuterol 108 (90 Base) MCG/ACT inhaler Commonly known as: VENTOLIN HFA INHALE 1-2 PUFFS EVERY 4 HOURS AS NEEDED FOR WHEEZING AND FOR SHORTNESS OF BREATH.   amitriptyline 25 MG tablet Commonly known as: ELAVIL TAKE 1 TO 2 TABLETS AT BEDTIME.   budesonide-formoterol 160-4.5 MCG/ACT inhaler Commonly known as: SYMBICORT Inhale 2 puffs into the lungs 2 (two) times daily.   cyclobenzaprine 10 MG tablet Commonly known as: FLEXERIL TAKE 1 TABLET BY MOUTH AT BEDTIME.   diclofenac 75 MG EC tablet Commonly known as: VOLTAREN Take 1 tablet (75 mg total) by mouth 2 (two) times daily.   esomeprazole 40 MG capsule Commonly known as: NEXIUM TAKE 1 CAPSULE BY MOUTH ONCE DAILY.   Incruse Ellipta 62.5 MCG/INH Aepb Generic drug: umeclidinium bromide INHALE 1 PUFF ONCE DAILY.   ipratropium 0.06 % nasal spray Commonly known as: ATROVENT Place 1 spray into both nostrils daily as needed for rhinitis.   Melatonin 3 MG Caps Take 3 mg by mouth at bedtime.   Nebulizer/Tubing/Mouthpiece Kit Use w/ nebulizer as needed   predniSONE 20 MG tablet Commonly known as: DELTASONE 2 po at same time daily for 5 days Started by: Worthy Rancher, MD  Objective:   BP (!) 146/89   Pulse 93   Temp (!) 95.7 F (35.4 C)   Ht 5' 7"  (1.702 m)   Wt 282 lb (127.9 kg)   SpO2 96%   BMI 44.17 kg/m   Wt Readings from Last 3 Encounters:  03/20/19 282 lb (127.9 kg)  11/04/18 283 lb 6.4 oz (128.5 kg)  08/29/17 246 lb 12.8 oz (111.9 kg)    Physical Exam Vitals and nursing note reviewed.  Constitutional:      General: She is not in acute distress.    Appearance: She is well-developed. She is not diaphoretic.  Eyes:     Conjunctiva/sclera: Conjunctivae normal.  Cardiovascular:     Rate and Rhythm: Normal rate and regular rhythm.      Heart sounds: Normal heart sounds. No murmur.  Pulmonary:     Effort: Pulmonary effort is normal. No respiratory distress.     Breath sounds: Normal breath sounds. No wheezing.  Musculoskeletal:        General: No tenderness. Normal range of motion.     Right hip: Crepitus present. No deformity, tenderness or bony tenderness. Normal range of motion. Normal strength.  Skin:    General: Skin is warm and dry.     Findings: No rash.  Neurological:     Mental Status: She is alert and oriented to person, place, and time.     Coordination: Coordination normal.  Psychiatric:        Behavior: Behavior normal.     Right hip x-ray: Arthritic and degenerative changes in the hip consistent with osteoarthritis  Assessment & Plan:   Problem List Items Addressed This Visit    None    Visit Diagnoses    Right hip pain    -  Primary   Relevant Orders   DG HIP UNILAT W OR W/O PELVIS 2-3 VIEWS RIGHT   Onychomycosis       Relevant Orders   Ambulatory referral to Podiatry    Likely osteoarthritis in the right hip  Onychomycosis will refer to podiatry  Follow up plan: Return if symptoms worsen or fail to improve.  Counseling provided for all of the vaccine components Orders Placed This Encounter  Procedures  . DG HIP UNILAT W OR W/O PELVIS 2-3 VIEWS RIGHT  . Ambulatory referral to [place], MD [facility] 03/20/2019, 3:45 PM

## 2019-03-27 ENCOUNTER — Telehealth: Payer: Self-pay | Admitting: Family Medicine

## 2019-03-27 DIAGNOSIS — M1612 Unilateral primary osteoarthritis, left hip: Secondary | ICD-10-CM

## 2019-03-27 MED ORDER — CYCLOBENZAPRINE HCL 10 MG PO TABS: 10.0000 mg | ORAL_TABLET | Freq: Every day | ORAL | 0 refills | Status: DC

## 2019-03-27 MED ORDER — DICLOFENAC SODIUM 75 MG PO TBEC: 75.0000 mg | DELAYED_RELEASE_TABLET | Freq: Two times a day (BID) | ORAL | 0 refills | Status: DC

## 2019-03-27 MED ORDER — PREDNISONE 20 MG PO TABS: ORAL_TABLET | ORAL | 0 refills | Status: DC

## 2019-03-27 NOTE — Telephone Encounter (Signed)
Pt aware.

## 2019-03-27 NOTE — Telephone Encounter (Signed)
They were accidentally sent to [facility], I now sent them to [facility].

## 2019-03-27 NOTE — Telephone Encounter (Signed)
Pt says she was here last week and you were going to call in prednisone, diclofenac and flexeril to [facility]

## 2019-04-03 ENCOUNTER — Telehealth: Payer: Self-pay | Admitting: Family Medicine

## 2019-04-03 MED ORDER — IBUPROFEN 800 MG PO TABS: 800.0000 mg | ORAL_TABLET | Freq: Three times a day (TID) | ORAL | 1 refills | Status: DC | PRN

## 2019-04-03 NOTE — Telephone Encounter (Signed)
Patient informed that IBU has been sent to [place]. She knows not to take oral Voltaren and IBU together due to an interaction. Also to take IBU with food.

## 2019-04-03 NOTE — Telephone Encounter (Signed)
I sent in the prescription of ibuprofen for the patient, make sure she takes it with food.  If she is going to take this then I would not recommend her take the oral Voltaren/diclofenac because they do interact.

## 2019-04-03 NOTE — Telephone Encounter (Signed)
Not on current med list, has not had Rx since 07/2018 Please advise

## 2019-04-03 NOTE — Telephone Encounter (Signed)
  Medication Request  04/03/2019  What is the name of the medication? Ibuprofen 800 mg  Have you contacted your pharmacy to request a refill? No  Which pharmacy would you like this sent to? [facility]   Patient notified that their request is being sent to the clinical staff for review and that they should receive a call once it is complete. If they do not receive a call within 24 hours they can check with their pharmacy or our office.   [name]'s pt  Cream isn't working.  Hips are hurting bad.

## 2019-04-08 ENCOUNTER — Telehealth: Payer: Self-pay | Admitting: Family Medicine

## 2019-04-08 NOTE — Chronic Care Management (AMB) (Signed)
  Chronic Care Management   Note  04/08/2019 Name: Paizlee Kinder MRN: [id number] DOB: 04-28-1955  Suzzette Gasparro Khiev is a 64 y.o. year old female who is a primary care patient of [name], Fransisca Kaufmann, MD. I reached out to [name] by phone today in response to a referral sent by Ms. Thedore Mins Kallenberger's health plan.     Ms. Baglio was given information about Chronic Care Management services today including:  1. CCM service includes personalized support from designated clinical staff supervised by her physician, including individualized plan of care and coordination with other care providers 2. 24/7 contact phone numbers for assistance for urgent and routine care needs. 3. Service will only be billed when office clinical staff spend 20 minutes or more in a month to coordinate care. 4. Only one practitioner may furnish and bill the service in a calendar month. 5. The patient may stop CCM services at any time (effective at the end of the month) by phone call to the office staff. 6. The patient will be responsible for cost sharing (co-pay) of up to 20% of the service fee (after annual deductible is met).  Patient agreed to services and verbal consent obtained.   Follow up plan: Telephone appointment with care management team member scheduled for:09/26/2019.  [place], [place] [address] Direct Dial: [phone] [email or web address] Website: [email or web address]

## 2019-04-11 ENCOUNTER — Telehealth: Payer: Self-pay | Admitting: Family Medicine

## 2019-04-22 ENCOUNTER — Other Ambulatory Visit: Payer: Self-pay | Admitting: Family Medicine

## 2019-04-22 DIAGNOSIS — F329 Major depressive disorder, single episode, unspecified: Secondary | ICD-10-CM

## 2019-04-22 DIAGNOSIS — J439 Emphysema, unspecified: Secondary | ICD-10-CM

## 2019-04-22 DIAGNOSIS — F419 Anxiety disorder, unspecified: Secondary | ICD-10-CM

## 2019-04-23 ENCOUNTER — Other Ambulatory Visit: Payer: Self-pay | Admitting: Family Medicine

## 2019-04-23 NOTE — Telephone Encounter (Signed)
Medication was refilled on 04/22/2019, patient aware.

## 2019-04-23 NOTE — Telephone Encounter (Signed)
°  Prescription Request  04/23/2019  What is the name of the medication or equipment? amitriptyline (ELAVIL) 25 MG tablet  Have you contacted your pharmacy to request a refill? (if applicable) yes  Which pharmacy would you like this sent to? [facility]   Patient notified that their request is being sent to the clinical staff for review and that they should receive a response within 2 business days.

## 2019-04-28 ENCOUNTER — Other Ambulatory Visit: Payer: Self-pay | Admitting: Family Medicine

## 2019-04-28 DIAGNOSIS — J439 Emphysema, unspecified: Secondary | ICD-10-CM

## 2019-05-14 ENCOUNTER — Other Ambulatory Visit: Payer: Self-pay | Admitting: Family Medicine

## 2019-05-16 ENCOUNTER — Telehealth: Payer: Self-pay | Admitting: Family Medicine

## 2019-05-16 NOTE — Telephone Encounter (Signed)
First available appointment with Dr. Warrick Parisian is 05/26/2019.  Offered patient an appointment sooner than that with other providers but she wants to wait and see Dr. Warrick Parisian on 05/26/2019.  Appointment scheduled.

## 2019-05-26 ENCOUNTER — Encounter: Payer: Self-pay | Admitting: Family Medicine

## 2019-05-26 ENCOUNTER — Ambulatory Visit (INDEPENDENT_AMBULATORY_CARE_PROVIDER_SITE_OTHER): Payer: Medicare Other | Admitting: Family Medicine

## 2019-05-26 ENCOUNTER — Other Ambulatory Visit: Payer: Self-pay

## 2019-05-26 VITALS — BP 180/96 | HR 79 | Temp 97.3°F | Ht 67.0 in | Wt 273.0 lb

## 2019-05-26 DIAGNOSIS — Z5900 Homelessness unspecified: Secondary | ICD-10-CM

## 2019-05-26 DIAGNOSIS — M1712 Unilateral primary osteoarthritis, left knee: Secondary | ICD-10-CM

## 2019-05-26 DIAGNOSIS — F339 Major depressive disorder, recurrent, unspecified: Secondary | ICD-10-CM | POA: Diagnosis not present

## 2019-05-26 DIAGNOSIS — M1711 Unilateral primary osteoarthritis, right knee: Secondary | ICD-10-CM

## 2019-05-26 MED ORDER — METHYLPREDNISOLONE ACETATE 80 MG/ML IJ SUSP
80.0000 mg | Freq: Once | INTRAMUSCULAR | Status: AC
  Administered 2019-05-26: 80 mg via INTRAMUSCULAR

## 2019-05-26 MED ORDER — PAROXETINE HCL 10 MG PO TABS: 10.0000 mg | ORAL_TABLET | Freq: Every day | ORAL | 1 refills | Status: DC

## 2019-05-26 NOTE — Progress Notes (Signed)
BP (!) 180/96   Pulse 79   Temp (!) 97.3 F (36.3 C)   Ht _0  (1.702 m)   Wt 273 lb (123.8 kg)   SpO2 97%   BMI 42.76 kg/m    Subjective:   Patient ID: Tracy Neal, female    DOB: [birth year], 64 y.o.   MRN: [id number]  HPI: Tracy Neal is a 64 y.o. female presenting on 05/26/2019 for Medical Management of Chronic Issues, Knee Pain (bilateral), and Depression   HPI Bilateral knee pain osteoarthritis Patient has been having bilateral knee pain from osteoarthritis is been worsening, spent 3 months the injections in quite last 3 months but she would still like to do injections but she also wants to do an orthopedic referral for help with this.  Patient denies any fevers or chills or redness or warmth.  She does have a little swelling in the right knee today.  Anxiety and depression. Patient is having significant anxiety depression especially because she is homeless now and she is gone through multiple family members but does not have a good place to live.  She has thoughts of wishing to be better off dead all of the time but no suicidal ideations or thoughts of actually killing herself but just sometimes wishes that she would not wake up.  She says is been increased over the past couple months but especially since she is been homeless over the past few weeks.  Relevant past medical, surgical, family and social history reviewed and updated as indicated. Interim medical history since our last visit reviewed. Allergies and medications reviewed and updated.  Review of Systems  Constitutional: Negative for chills and fever.  Eyes: Negative for visual disturbance.  Respiratory: Negative for chest tightness and shortness of breath.   Cardiovascular: Negative for chest pain and leg swelling.  Musculoskeletal: Positive for arthralgias.  Skin: Negative for rash.  Psychiatric/Behavioral: Positive for dysphoric mood and sleep disturbance. Negative for agitation, behavioral  problems, self-injury and suicidal ideas. The patient is nervous/anxious.   All other systems reviewed and are negative.   Per HPI unless specifically indicated above   Allergies as of 05/26/2019      Reactions   Percocet [oxycodone-acetaminophen] Itching   And nausea   Oxycodone-acetaminophen Nausea And Vomiting, Nausea Only   And feels like bugs crawling on skin      Medication List       Accurate as of May 26, 2019  4:28 PM. If you have any questions, ask your nurse or doctor.        STOP taking these medications   Melatonin 3 MG Caps Stopped by: Fransisca Kaufmann Aodhan Scheidt, MD   predniSONE 20 MG tablet Commonly known as: DELTASONE Stopped by: Fransisca Kaufmann Kaitlin Alcindor, MD     TAKE these medications   albuterol 108 (90 Base) MCG/ACT inhaler Commonly known as: VENTOLIN HFA INHALE 1-2 PUFFS EVERY 4 HOURS AS NEEDED FOR WHEEZING AND FOR SHORTNESS OF BREATH.   albuterol (2.5 MG/3ML) 0.083% nebulizer solution Commonly known as: PROVENTIL USE 1 VIAL IN NEBULIZER EVERY 6 HOURS AS NEEDED FOR WHEEZING OR SHORTNESS OF BREATH.   amitriptyline 25 MG tablet Commonly known as: ELAVIL Take 1-2 tablets (25-50 mg total) by mouth at bedtime. (Needs to be seen before next refill) What changed:   when to take this  reasons to take this   budesonide-formoterol 160-4.5 MCG/ACT inhaler Commonly known as: SYMBICORT Inhale 2 puffs into the lungs 2 (two) times daily.   cyclobenzaprine  10 MG tablet Commonly known as: FLEXERIL Take 1 tablet (10 mg total) by mouth at bedtime.   diclofenac 75 MG EC tablet Commonly known as: VOLTAREN Take 1 tablet (75 mg total) by mouth 2 (two) times daily.   esomeprazole 40 MG capsule Commonly known as: NEXIUM TAKE 1 CAPSULE BY MOUTH ONCE DAILY.   IBU 800 MG tablet Generic drug: ibuprofen TAKE 1 TABLET EVERY 8 HOURS AS NEEDED.   Incruse Ellipta 62.5 MCG/INH Aepb Generic drug: umeclidinium bromide INHALE 1 PUFF ONCE DAILY.   ipratropium 0.06 % nasal spray  Commonly known as: ATROVENT Place 1 spray into both nostrils daily as needed for rhinitis.   Nebulizer/Tubing/Mouthpiece Kit Use w/ nebulizer as needed   PARoxetine 10 MG tablet Commonly known as: Paxil Take 1 tablet (10 mg total) by mouth daily. Started by: Fransisca Kaufmann Yatziry Deakins, MD        Objective:   BP (!) 180/96   Pulse 79   Temp (!) 97.3 F (36.3 C)   Ht _0  (1.702 m)   Wt 273 lb (123.8 kg)   SpO2 97%   BMI 42.76 kg/m   Wt Readings from Last 3 Encounters:  05/26/19 273 lb (123.8 kg)  03/20/19 282 lb (127.9 kg)  11/04/18 283 lb 6.4 oz (128.5 kg)    Physical Exam Vitals and nursing note reviewed.  Constitutional:      General: She is not in acute distress.    Appearance: She is well-developed. She is not diaphoretic.  Eyes:     Conjunctiva/sclera: Conjunctivae normal.  Cardiovascular:     Rate and Rhythm: Normal rate and regular rhythm.     Heart sounds: Normal heart sounds. No murmur.  Pulmonary:     Effort: Pulmonary effort is normal. No respiratory distress.     Breath sounds: Normal breath sounds. No wheezing.  Musculoskeletal:     Right knee: Effusion and crepitus present. No deformity, erythema or bony tenderness. Normal range of motion. Tenderness present over the medial joint line and lateral joint line.     Left knee: Effusion and crepitus present. No deformity, erythema or bony tenderness. Normal range of motion. Tenderness present over the medial joint line. No lateral joint line tenderness.  Skin:    General: Skin is warm and dry.     Findings: No rash.  Neurological:     Mental Status: She is alert and oriented to person, place, and time.     Coordination: Coordination normal.  Psychiatric:        Mood and Affect: Mood is anxious and depressed.        Behavior: Behavior normal.        Thought Content: Thought content does not include suicidal ideation. Thought content does not include suicidal plan.     Knee injection bilateral: Consent  form signed. Risk factors of bleeding and infection discussed with patient and patient is agreeable towards injection. Patient prepped with Betadine. Lateral approach towards injection used. Injected 80 mg of Depo-Medrol and 1 mL of 2% lidocaine. Patient tolerated procedure well and no side effects from noted. Minimal to no bleeding. Simple bandage applied after.   Assessment & Plan:   Problem List Items Addressed This Visit      Musculoskeletal and Integument   Osteoarthritis of right knee - Primary   Relevant Orders   Ambulatory referral to Orthopedic Surgery   Osteoarthritis of left knee   Relevant Orders   Ambulatory referral to Orthopedic Surgery    Other  Visit Diagnoses    Depression, recurrent ([place])       Relevant Medications   PARoxetine (PAXIL) 10 MG tablet   Other Relevant Orders   Referral to Chronic Care Management Services   Homeless       Relevant Orders   Referral to Chronic Care Management Services      Will do bilateral knee injections, referral to orthopedic as well.  Follow up plan: Return in about 4 weeks (around 06/23/2019), or if symptoms worsen or fail to improve, for Depression recheck.  Counseling provided for all of the vaccine components Orders Placed This Encounter  Procedures  . Ambulatory referral to Orthopedic Surgery  . Referral to Chronic Care Management Services    Caryl Pina, MD [facility] 05/26/2019, 4:28 PM

## 2019-05-27 ENCOUNTER — Telehealth: Payer: Self-pay | Admitting: Family Medicine

## 2019-05-27 NOTE — Chronic Care Management (AMB) (Signed)
  Chronic Care Management   Note  05/27/2019 Name: Brit Carbonell MRN: [id number] DOB: 07-07-1955  Thedore Mins Laursen is a 63 y.o. year old female who is a primary care patient of [name], Fransisca Kaufmann, MD. Virginia Curl is currently enrolled in care management services. An additional referral for LCSW was placed.   Follow up plan: Telephone appointment with care management team member scheduled for:06/06/2019  Glenna Durand, LPN Health Advisor, [facility] ??[email or web address] ??[phone]

## 2019-06-06 ENCOUNTER — Ambulatory Visit (INDEPENDENT_AMBULATORY_CARE_PROVIDER_SITE_OTHER): Payer: Medicare Other | Admitting: Licensed Clinical Social Worker

## 2019-06-06 DIAGNOSIS — M1711 Unilateral primary osteoarthritis, right knee: Secondary | ICD-10-CM

## 2019-06-06 DIAGNOSIS — M5136 Other intervertebral disc degeneration, lumbar region: Secondary | ICD-10-CM

## 2019-06-06 DIAGNOSIS — F419 Anxiety disorder, unspecified: Secondary | ICD-10-CM

## 2019-06-06 DIAGNOSIS — J439 Emphysema, unspecified: Secondary | ICD-10-CM | POA: Diagnosis not present

## 2019-06-06 DIAGNOSIS — K219 Gastro-esophageal reflux disease without esophagitis: Secondary | ICD-10-CM

## 2019-06-06 DIAGNOSIS — F329 Major depressive disorder, single episode, unspecified: Secondary | ICD-10-CM | POA: Diagnosis not present

## 2019-06-06 NOTE — Chronic Care Management (AMB) (Signed)
Chronic Care Management    Clinical Social Work Follow Up Note  06/06/2019 Name: Tracy Neal MRN: [id number] DOB: November 28, 1955  Tracy Neal is a 64 y.o. year old female who is a primary care patient of [name], Fransisca Kaufmann, MD. The CCM team was consulted for assistance with [name] .   Review of patient status, including review of consultants reports, other relevant assessments, and collaboration with appropriate care team members and the patient's provider was performed as part of comprehensive patient evaluation and provision of chronic care management services.    SDOH (Social Determinants of Health) assessments performed: Yes; risk for depression;risk for tobacco use  SDOH Interventions     Most Recent Value  SDOH Interventions  Depression Interventions/Treatment   Medication        Chronic Care Management from 06/06/2019 in [facility]  PHQ-9 Total Score  4     GAD 7 : Generalized Anxiety Score 06/06/2019 05/27/2019 03/20/2019  Nervous, Anxious, on Edge 1 3 3   Control/stop worrying 1 3 3   Worry too much - different things 1 3 3   Trouble relaxing 0 3 3  Restless 0 3 2  Easily annoyed or irritable 0 3 3  Afraid - awful might happen 1 3 3   Total GAD 7 Score 4 21 20   Anxiety Difficulty Somewhat difficult - -    Outpatient Encounter Medications as of 06/06/2019  Medication Sig  . albuterol (PROVENTIL) (2.5 MG/3ML) 0.083% nebulizer solution USE 1 VIAL IN NEBULIZER EVERY 6 HOURS AS NEEDED FOR WHEEZING OR SHORTNESS OF BREATH.  Marland Kitchen albuterol (VENTOLIN HFA) 108 (90 Base) MCG/ACT inhaler INHALE 1-2 PUFFS EVERY 4 HOURS AS NEEDED FOR WHEEZING AND FOR SHORTNESS OF BREATH.  Marland Kitchen amitriptyline (ELAVIL) 25 MG tablet Take 1-2 tablets (25-50 mg total) by mouth at bedtime. (Needs to be seen before next refill) (Patient taking differently: Take 25-50 mg by mouth at bedtime as needed. (Needs to be seen before next refill))  . budesonide-formoterol (SYMBICORT)  160-4.5 MCG/ACT inhaler Inhale 2 puffs into the lungs 2 (two) times daily.  . cyclobenzaprine (FLEXERIL) 10 MG tablet Take 1 tablet (10 mg total) by mouth at bedtime.  . diclofenac (VOLTAREN) 75 MG EC tablet Take 1 tablet (75 mg total) by mouth 2 (two) times daily. (Patient not taking: Reported on 05/26/2019)  . esomeprazole (NEXIUM) 40 MG capsule TAKE 1 CAPSULE BY MOUTH ONCE DAILY.  . IBU 800 MG tablet TAKE 1 TABLET EVERY 8 HOURS AS NEEDED.  Marland Kitchen INCRUSE ELLIPTA 62.5 MCG/INH AEPB INHALE 1 PUFF ONCE DAILY.  Marland Kitchen ipratropium (ATROVENT) 0.06 % nasal spray Place 1 spray into both nostrils daily as needed for rhinitis.  Marland Kitchen PARoxetine (PAXIL) 10 MG tablet Take 1 tablet (10 mg total) by mouth daily.  Marland Kitchen Respiratory Therapy Supplies (NEBULIZER/TUBING/MOUTHPIECE) KIT Use w/ nebulizer as needed   No facility-administered encounter medications on file as of 06/06/2019.     Goals Addressed            This Visit's Progress   . Client will talk with LCSW in next 30 days about housing issues of client (pt-stated)       CARE PLAN ENTRY   Current Barriers:  . Housing challenges in client with Chronic Diagnoses of GERD, DDD, OA, Osteoporosis, COPD,Anxiety and Depression   Clinical Social Work Clinical Goal(s):  Marland Kitchen LCSW to call client in next 30 days to discuss housing needs of client  Interventions:  Talked with client about CCM support Talked with  client about social support network Talked with client about sleeping challenges of client Talked with client about upcoming medical appointments  Talked with client about pain issues of client Talked with client about relaxation techniques (enjoys talking with family or friends, listens to music, sits on porch to relax) Talked with client about anxiety issues of client Encouraged client to talk with RNCM as needed for nursing support  Patient Self Care Activities:   Drives car to needed appointments and to do errands Completes ADLs independently   Patient Self Care Deficits: . Housing challenges  Initial goal documentation        Follow Up Plan: LCSW to call client in next 4 weeks to talk with client about housing needs of client  Norva Riffle.Forrest MSW, LCSW Licensed Clinical Social Worker [facility] [phone]

## 2019-06-06 NOTE — Patient Instructions (Addendum)
Licensed Clinical Social Worker Visit Information  Goals we discussed today:  Goals Addressed            This Visit's Progress   . Client will talk with LCSW in next 30 days about housing issues of client (pt-stated)       CARE PLAN ENTRY   Current Barriers:  . Housing challenges of client with Chronic Diagnoses of Anxiety and Depression, GERD, DDD, OA, Osteoporosis, COPD   Clinical Social Work Clinical Goal(s):  Marland Kitchen LCSW to call client in next 30 days to discuss housing needs of client  Interventions:  Talked with client about CCM support Talked with client about social support network Talked with client about sleeping challenges of client Talked with client about upcoming medical appointments  Talked with client about pain issues of client Talked with clinet about relaxation techniques (enjoys taking with family or friends, listens to listen to music, sits on porch to relax) Talked with client about anxiety issues of client Encouraged client to talk with RNCM as needed for nursing support  Patient Self Care Activities:   Drives car to needed appointments and to do errands Completes ADLs independently  Patient Self Care Deficits: . Housing challenges  Initial goal documentation        Materials Provided: No  Follow Up Plan: LCSW to call client in next 4 weeks to talk with client about housing needs of client  The patient verbalized understanding of instructions provided today and declined a print copy of patient instruction materials.   Norva Riffle.Ziggy Chanthavong MSW, LCSW Licensed Clinical Social Worker [facility] Care Management [phone]

## 2019-06-07 ENCOUNTER — Other Ambulatory Visit: Payer: Self-pay | Admitting: Family Medicine

## 2019-06-07 DIAGNOSIS — J439 Emphysema, unspecified: Secondary | ICD-10-CM

## 2019-06-10 ENCOUNTER — Other Ambulatory Visit: Payer: Self-pay | Admitting: Family Medicine

## 2019-06-10 ENCOUNTER — Encounter: Payer: Self-pay | Admitting: Orthopaedic Surgery

## 2019-06-10 ENCOUNTER — Ambulatory Visit: Payer: Medicare Other

## 2019-06-10 ENCOUNTER — Other Ambulatory Visit: Payer: Self-pay

## 2019-06-10 ENCOUNTER — Ambulatory Visit (INDEPENDENT_AMBULATORY_CARE_PROVIDER_SITE_OTHER): Payer: Medicare Other | Admitting: Orthopaedic Surgery

## 2019-06-10 VITALS — Ht 67.0 in | Wt 272.5 lb

## 2019-06-10 DIAGNOSIS — G8929 Other chronic pain: Secondary | ICD-10-CM

## 2019-06-10 DIAGNOSIS — F17210 Nicotine dependence, cigarettes, uncomplicated: Secondary | ICD-10-CM | POA: Diagnosis not present

## 2019-06-10 DIAGNOSIS — M25562 Pain in left knee: Secondary | ICD-10-CM | POA: Diagnosis not present

## 2019-06-10 DIAGNOSIS — M25561 Pain in right knee: Secondary | ICD-10-CM | POA: Diagnosis not present

## 2019-06-10 DIAGNOSIS — Z6841 Body Mass Index (BMI) 40.0 and over, adult: Secondary | ICD-10-CM

## 2019-06-10 DIAGNOSIS — J439 Emphysema, unspecified: Secondary | ICD-10-CM

## 2019-06-10 DIAGNOSIS — K219 Gastro-esophageal reflux disease without esophagitis: Secondary | ICD-10-CM

## 2019-06-10 NOTE — Progress Notes (Signed)
Subjective:    Patient ID: Tracy Neal, female    DOB: 1955/04/22, 64 y.o.   MRN: [id number]  HPI She has bilateral knee pain.  She has been seen at [facility].  I have reviewed their notes. She had injections in both knees recently which helped. She uses a cane.  Her knees are getting worse, the right more than the left.  She has no trauma, no redness.  She has popping and swelling and giving way of the right knee.  She is taking ibuprofen for the knees which help. She also is taking Flexeril for back and hip pain/spasm.    Review of Systems  Constitutional: Positive for activity change.  Musculoskeletal: Positive for arthralgias, back pain, gait problem and joint swelling.  All other systems reviewed and are negative.  For Review of Systems, all other systems reviewed and are negative.  The following is a summary of the past history medically, past history surgically, known current medicines, social history and family history.  This information is gathered electronically by the computer from prior information and documentation.  I review this each visit and have found including this information at this point in the chart is beneficial and informative.   Past Medical History:  Diagnosis Date  . Anxiety   . Arthritis   . Basal cell carcinoma    not biopsy confirmed; Right bridge of nose  . Breast cancer ([place])   . COPD (chronic obstructive pulmonary disease) ([place])   . COPD, moderate ([place])   . Depression   . GERD (gastroesophageal reflux disease)   . Shortness of breath dyspnea   . Umbilical hernia     Past Surgical History:  Procedure Laterality Date  . BREAST LUMPECTOMY Right 07/15/2012   Procedure: BREAST LUMPECTOMY WITH AXILLARY LYMPH NODE BIOPSY;  Surgeon: Harl Bowie, MD;  Location: [place];  Service: General;  Laterality: Right;  Nuclear medicine injection 9:30   . CHOLECYSTECTOMY    . COLONOSCOPY N/A 03/16/2015   Procedure: COLONOSCOPY;  Surgeon: Danie Binder, MD;  [facility];  Service: Endoscopy;  Laterality: N/A;  2:30 PM - moved to 12:30 - office to notify pt  . TUBAL LIGATION      Current Outpatient Medications on File Prior to Visit  Medication Sig Dispense Refill  . albuterol (PROVENTIL) (2.5 MG/3ML) 0.083% nebulizer solution USE 1 VIAL IN NEBULIZER EVERY 6 HOURS AS NEEDED FOR WHEEZING OR SHORTNESS OF BREATH. 360 mL 0  . albuterol (VENTOLIN HFA) 108 (90 Base) MCG/ACT inhaler INHALE 1-2 PUFFS EVERY 4 HOURS AS NEEDED FOR WHEEZING AND FOR SHORTNESS OF BREATH. 18 g 0  . amitriptyline (ELAVIL) 25 MG tablet Take 1-2 tablets (25-50 mg total) by mouth at bedtime. (Needs to be seen before next refill) (Patient taking differently: Take 25-50 mg by mouth at bedtime as needed. (Needs to be seen before next refill)) 60 tablet 0  . budesonide-formoterol (SYMBICORT) 160-4.5 MCG/ACT inhaler Inhale 2 puffs into the lungs 2 (two) times daily. (Needs to be seen before next refill) 6 g 0  . cyclobenzaprine (FLEXERIL) 10 MG tablet Take 1 tablet (10 mg total) by mouth at bedtime. 30 tablet 0  . esomeprazole (NEXIUM) 40 MG capsule TAKE 1 CAPSULE BY MOUTH ONCE DAILY. 30 capsule 0  . IBU 800 MG tablet TAKE 1 TABLET EVERY 8 HOURS AS NEEDED. 60 tablet 0  . ipratropium (ATROVENT) 0.06 % nasal spray Place 1 spray into both nostrils daily as needed for  rhinitis.    Marland Kitchen PARoxetine (PAXIL) 10 MG tablet Take 1 tablet (10 mg total) by mouth daily. 30 tablet 1  . Respiratory Therapy Supplies (NEBULIZER/TUBING/MOUTHPIECE) KIT Use w/ nebulizer as needed 1 kit 0  . umeclidinium bromide (INCRUSE ELLIPTA) 62.5 MCG/INH AEPB Inhale 1 puff into the lungs daily. (Needs to be seen before next refill) 30 each 0   No current facility-administered medications on file prior to visit.    Social History   Socioeconomic History  . Marital status: Single    Spouse name: Not on file  . Number of children: Not on file  . Years of education: Not on file  . Highest  education level: Not on file  Occupational History  . Not on file  Tobacco Use  . Smoking status: Current Some Day Smoker    Packs/day: 0.50    Years: 40.00    Pack years: 20.00    Types: Cigarettes  . Smokeless tobacco: Never Used  Substance and Sexual Activity  . Alcohol use: No  . Drug use: No  . Sexual activity: Not Currently    Birth control/protection: None  Other Topics Concern  . Not on file  Social History Narrative  . Not on file   Social Determinants of Health   Financial Resource Strain:   . Difficulty of Paying Living Expenses:   Food Insecurity:   . Worried About Charity fundraiser in the Last Year:   . Arboriculturist in the Last Year:   Transportation Needs:   . Film/video editor (Medical):   Marland Kitchen Lack of Transportation (Non-Medical):   Physical Activity:   . Days of Exercise per Week:   . Minutes of Exercise per Session:   Stress:   . Feeling of Stress :   Social Connections:   . Frequency of Communication with Friends and Family:   . Frequency of Social Gatherings with Friends and Family:   . Attends Religious Services:   . Active Member of Clubs or Organizations:   . Attends Archivist Meetings:   Marland Kitchen Marital Status:   Intimate Partner Violence:   . Fear of Current or Ex-Partner:   . Emotionally Abused:   Marland Kitchen Physically Abused:   . Sexually Abused:     Family History  Problem Relation Age of Onset  . Diabetes Mother   . Stroke Mother   . Multiple sclerosis Mother   . Cancer Paternal Aunt     Ht 5' 7"  (1.702 m)   Wt 272 lb 8 oz (123.6 kg)   BMI 42.68 kg/m   Body mass index is 42.68 kg/m.  The patient meets the AMA guidelines for Morbid (severe) obesity with a BMI > 40.0 and I have recommended weight loss.       Objective:   Physical Exam Vitals and nursing note reviewed.  Constitutional:      Appearance: She is well-developed.  HENT:     Head: Normocephalic and atraumatic.  Eyes:     Conjunctiva/sclera:  Conjunctivae normal.     Pupils: Pupils are equal, round, and reactive to light.  Cardiovascular:     Rate and Rhythm: Normal rate and regular rhythm.  Pulmonary:     Effort: Pulmonary effort is normal.  Abdominal:     Palpations: Abdomen is soft.  Musculoskeletal:     Cervical back: Normal range of motion and neck supple.       Legs:  Skin:    General: Skin is warm  and dry.  Neurological:     Mental Status: She is alert and oriented to person, place, and time.     Cranial Nerves: No cranial nerve deficit.     Motor: No abnormal muscle tone.     Coordination: Coordination normal.     Deep Tendon Reflexes: Reflexes are normal and symmetric. Reflexes normal.  Psychiatric:        Behavior: Behavior normal.        Thought Content: Thought content normal.        Judgment: Judgment normal.      X-rays were done of both knees, reported separately.     Assessment & Plan:   Encounter Diagnoses  Name Primary?  . Chronic pain of both knees Yes  . Pulmonary emphysema, unspecified emphysema type ([place])   . Morbid obesity ([place])   . Nicotine dependence, cigarettes, uncomplicated    I will get MRI of the right knee as she has continued pain, giving way, medial positive McMurray and needs a cane.  I am concerned about meniscus tear.  Return after the MRI.  Continue present medicines.  Continue the cane.  Call if any problem.  Precautions discussed.   Electronically Signed Sanjuana Kava, MD 5/25/20212:56 PM

## 2019-06-10 NOTE — Patient Instructions (Signed)

## 2019-06-24 ENCOUNTER — Ambulatory Visit: Payer: Medicare Other | Admitting: Orthopaedic Surgery

## 2019-07-09 ENCOUNTER — Ambulatory Visit (INDEPENDENT_AMBULATORY_CARE_PROVIDER_SITE_OTHER): Payer: Medicare Other | Admitting: Licensed Clinical Social Worker

## 2019-07-09 ENCOUNTER — Other Ambulatory Visit: Payer: Self-pay

## 2019-07-09 ENCOUNTER — Ambulatory Visit (HOSPITAL_COMMUNITY)
Admission: RE | Admit: 2019-07-09 | Discharge: 2019-07-09 | Disposition: A | Discharge status: Home / Self Care | Payer: Medicare Other | Source: Ambulatory Visit | Attending: Orthopaedic Surgery | Admitting: Orthopaedic Surgery

## 2019-07-09 DIAGNOSIS — M1711 Unilateral primary osteoarthritis, right knee: Secondary | ICD-10-CM | POA: Diagnosis not present

## 2019-07-09 DIAGNOSIS — F419 Anxiety disorder, unspecified: Secondary | ICD-10-CM | POA: Diagnosis not present

## 2019-07-09 DIAGNOSIS — M5136 Other intervertebral disc degeneration, lumbar region: Secondary | ICD-10-CM

## 2019-07-09 DIAGNOSIS — J439 Emphysema, unspecified: Secondary | ICD-10-CM | POA: Diagnosis not present

## 2019-07-09 DIAGNOSIS — M25562 Pain in left knee: Secondary | ICD-10-CM | POA: Diagnosis present

## 2019-07-09 DIAGNOSIS — G8929 Other chronic pain: Secondary | ICD-10-CM | POA: Diagnosis present

## 2019-07-09 DIAGNOSIS — M25561 Pain in right knee: Secondary | ICD-10-CM | POA: Diagnosis not present

## 2019-07-09 DIAGNOSIS — F329 Major depressive disorder, single episode, unspecified: Secondary | ICD-10-CM

## 2019-07-09 DIAGNOSIS — K219 Gastro-esophageal reflux disease without esophagitis: Secondary | ICD-10-CM

## 2019-07-09 NOTE — Patient Instructions (Addendum)
Licensed Clinical Social Worker Visit Information  Goals we discussed today:  Goals Addressed              This Visit's Progress   .  Client will talk with LCSW in next 30 days about housing issues of client (pt-stated)        CARE PLAN ENTRY   Current Barriers:  . Housing challenges with client with chronic diagnoses of Osteopenia, COPD, Osteoporosis, OA, DDD, GERD, Anxiety ad Depression Breast Cancer   Clinical Social Work Clinical Goal(s):  Marland Kitchen LCSW to call client in next 30 days to discuss housing needs of client  Interventions:  Talked with client about CCM support Talked with client about social support network Talked with client about sleeping challenges of client Talked with client about upcoming medical appointments  Talked with client about pain issues of client Talked with clinet about relaxation techniques (enjoys taking with family or friends, listens to listen to music, sits on porch to relax) Talked with client about anxiety issues of client Talked with client about transport needs of client Talked with client about ambulation needs of client Talked with client about vision challenges of client Talked with client about housing applications process for client  Patient Self Care Activities:   Drives car to needed appointments and to do errands  Patient Self Care Deficits: . Housing challenges  Initial goal documentation       Materials Provided: No  Follow Up Plan: Follow Up Plan:LCSW to call client in next 4 weeks to talk with client about housing needs of client  The patient verbalized understanding of instructions provided today and declined a print copy of patient instruction materials.   Norva Riffle.Jahnessa Vanduyn MSW, LCSW Licensed Clinical Social Worker [facility] [phone]

## 2019-07-09 NOTE — Chronic Care Management (AMB) (Signed)
Chronic Care Management    Clinical Social Work Follow Up Note  07/09/2019 Name: Tracy Neal MRN: [id number] DOB: 07-Feb-1955  Tracy Neal is a 64 y.o. year old female who is a primary care patient of [name], Fransisca Kaufmann, MD. The CCM team was consulted for assistance with [name] .   Review of patient status, including review of consultants reports, other relevant assessments, and collaboration with appropriate care team members and the patient's provider was performed as part of comprehensive patient evaluation and provision of chronic care management services.    SDOH (Social Determinants of Health) assessments performed: No;risk for tobacco use; risk for depression; risk for stress    Chronic Care Management from 06/06/2019 in [place]  PHQ-9 Total Score 4      GAD 7 : Generalized Anxiety Score 06/06/2019 05/27/2019 03/20/2019  Nervous, Anxious, on Edge 1 3 3   Control/stop worrying 1 3 3   Worry too much - different things 1 3 3   Trouble relaxing 0 3 3  Restless 0 3 2  Easily annoyed or irritable 0 3 3  Afraid - awful might happen 1 3 3   Total GAD 7 Score 4 21 20   Anxiety Difficulty Somewhat difficult - -    Outpatient Encounter Medications as of 07/09/2019  Medication Sig  . esomeprazole (NEXIUM) 40 MG capsule TAKE 1 CAPSULE BY MOUTH ONCE DAILY.  Marland Kitchen albuterol (PROVENTIL) (2.5 MG/3ML) 0.083% nebulizer solution USE 1 VIAL IN NEBULIZER EVERY 6 HOURS AS NEEDED FOR WHEEZING OR SHORTNESS OF BREATH.  Marland Kitchen albuterol (VENTOLIN HFA) 108 (90 Base) MCG/ACT inhaler INHALE 1-2 PUFFS EVERY 4 HOURS AS NEEDED FOR WHEEZING AND FOR SHORTNESS OF BREATH.  Marland Kitchen amitriptyline (ELAVIL) 25 MG tablet Take 1-2 tablets (25-50 mg total) by mouth at bedtime. (Needs to be seen before next refill) (Patient taking differently: Take 25-50 mg by mouth at bedtime as needed. (Needs to be seen before next refill))  . budesonide-formoterol (SYMBICORT) 160-4.5 MCG/ACT inhaler  Inhale 2 puffs into the lungs 2 (two) times daily. (Needs to be seen before next refill)  . cyclobenzaprine (FLEXERIL) 10 MG tablet Take 1 tablet (10 mg total) by mouth at bedtime.  . IBU 800 MG tablet TAKE 1 TABLET EVERY 8 HOURS AS NEEDED.  Marland Kitchen ipratropium (ATROVENT) 0.06 % nasal spray Place 1 spray into both nostrils daily as needed for rhinitis.  Marland Kitchen PARoxetine (PAXIL) 10 MG tablet Take 1 tablet (10 mg total) by mouth daily.  Marland Kitchen Respiratory Therapy Supplies (NEBULIZER/TUBING/MOUTHPIECE) KIT Use w/ nebulizer as needed  . umeclidinium bromide (INCRUSE ELLIPTA) 62.5 MCG/INH AEPB Inhale 1 puff into the lungs daily. (Needs to be seen before next refill)   No facility-administered encounter medications on file as of 07/09/2019.    Goals    .  Client will talk with LCSW in next 30 days about housing issues of client (pt-stated)      CARE PLAN ENTRY   Current Barriers:  . Housing challenges with client with chronic diagnoses of Osteopenia, COPD, Osteoporosis, OA, DDD, GERD, Anxiety ad Depression Breast Cancer   Clinical Social Work Clinical Goal(s):  Marland Kitchen LCSW to call client in next 30 days to discuss housing needs of client  Interventions:  Talked with client about CCM support Talked with client about social support network Talked with client about sleeping challenges of client Talked with client about upcoming medical appointments  Talked with client about pain issues of client Talked with clinet about relaxation techniques (enjoys taking with family  or friends, listens to listen to music, sits on porch to relax) Talked with client about anxiety issues of client Talked with client about transport needs of client Talked with client about ambulation needs of client Talked with client about vision challenges of client Talked with client about housing applications process for client  Patient Self Care Activities:   Drives car to needed appointments and to do errands  Patient Self Care  Deficits: . Housing challenges  Initial goal documentation      Follow Up Plan: LCSW to call client in next 4 weeks to talk with client about housing needs of client  Norva Riffle.Keanan Melander MSW, LCSW Licensed Clinical Social Worker [facility] [phone]

## 2019-07-10 ENCOUNTER — Ambulatory Visit: Payer: Medicare Other | Admitting: Orthopaedic Surgery

## 2019-07-15 ENCOUNTER — Other Ambulatory Visit: Payer: Self-pay | Admitting: Family Medicine

## 2019-07-15 DIAGNOSIS — K219 Gastro-esophageal reflux disease without esophagitis: Secondary | ICD-10-CM

## 2019-07-17 ENCOUNTER — Ambulatory Visit (INDEPENDENT_AMBULATORY_CARE_PROVIDER_SITE_OTHER): Payer: Medicare HMO | Admitting: Orthopaedic Surgery

## 2019-07-17 ENCOUNTER — Other Ambulatory Visit: Payer: Self-pay

## 2019-07-17 ENCOUNTER — Encounter: Payer: Self-pay | Admitting: Orthopaedic Surgery

## 2019-07-17 ENCOUNTER — Other Ambulatory Visit: Payer: Self-pay | Admitting: Family Medicine

## 2019-07-17 VITALS — BP 142/98 | HR 82 | Ht 67.0 in | Wt 262.0 lb

## 2019-07-17 DIAGNOSIS — M25561 Pain in right knee: Secondary | ICD-10-CM | POA: Diagnosis not present

## 2019-07-17 DIAGNOSIS — G8929 Other chronic pain: Secondary | ICD-10-CM

## 2019-07-17 DIAGNOSIS — Z716 Tobacco abuse counseling: Secondary | ICD-10-CM

## 2019-07-17 DIAGNOSIS — J439 Emphysema, unspecified: Secondary | ICD-10-CM

## 2019-07-17 DIAGNOSIS — F17210 Nicotine dependence, cigarettes, uncomplicated: Secondary | ICD-10-CM

## 2019-07-17 DIAGNOSIS — Z6841 Body Mass Index (BMI) 40.0 and over, adult: Secondary | ICD-10-CM | POA: Diagnosis not present

## 2019-07-17 DIAGNOSIS — M1711 Unilateral primary osteoarthritis, right knee: Secondary | ICD-10-CM

## 2019-07-17 MED ORDER — HYDROCODONE-ACETAMINOPHEN 5-325 MG PO TABS: 1.0000 | ORAL_TABLET | ORAL | 0 refills | Status: DC | PRN

## 2019-07-17 NOTE — Progress Notes (Signed)
Patient CV:ELFYB Tracy Neal, female DOB:1955/08/05, 64 y.o. OFB:[id number]  Chief Complaint  Patient presents with  . Knee Pain    R/ here to go over MRI    HPI  Tracy Neal is a 64 y.o. female who has right knee pain with giving way.  She had MRI which showed: IMPRESSION: Horizontal tear at the junction of the posterior horn and body of the medial meniscus extends into the posterior body of the meniscus.  Mild to moderate osteoarthritis most notable in the medial and patellofemoral compartments.  Small Baker's cyst.  I have explained findings to her.  I will have her see Dr. Aline Brochure for consideration of arthroscopy of the knee. I have explained the procedure to her.  She is agreeable.  I have independently reviewed the MRI.  Body mass index is 41.04 kg/m.   The patient meets the AMA guidelines for Morbid (severe) obesity with a BMI > 40.0 and I have recommended weight loss.   ROS  Review of Systems  Constitutional: Positive for activity change.  Musculoskeletal: Positive for arthralgias, back pain, gait problem and joint swelling.  All other systems reviewed and are negative.   All other systems reviewed and are negative.  The following is a summary of the past history medically, past history surgically, known current medicines, social history and family history.  This information is gathered electronically by the computer from prior information and documentation.  I review this each visit and have found including this information at this point in the chart is beneficial and informative.    Past Medical History:  Diagnosis Date  . Anxiety   . Arthritis   . Basal cell carcinoma    not biopsy confirmed; Right bridge of nose  . Breast cancer ([place])   . COPD (chronic obstructive pulmonary disease) ([place])   . COPD, moderate ([place])   . Depression   . GERD (gastroesophageal reflux disease)   . Shortness of breath dyspnea   . Umbilical hernia     Past  Surgical History:  Procedure Laterality Date  . BREAST LUMPECTOMY Right 07/15/2012   Procedure: BREAST LUMPECTOMY WITH AXILLARY LYMPH NODE BIOPSY;  Surgeon: Harl Bowie, MD;  Location: [place];  Service: General;  Laterality: Right;  Nuclear medicine injection 9:30   . CHOLECYSTECTOMY    . COLONOSCOPY N/A 03/16/2015   Procedure: COLONOSCOPY;  Surgeon: Danie Binder, MD;  [facility];  Service: Endoscopy;  Laterality: N/A;  2:30 PM - moved to 12:30 - office to notify pt  . TUBAL LIGATION      Family History  Problem Relation Age of Onset  . Diabetes Mother   . Stroke Mother   . Multiple sclerosis Mother   . Cancer Paternal Aunt     Social History Social History   Tobacco Use  . Smoking status: Current Some Day Smoker    Packs/day: 0.50    Years: 40.00    Pack years: 20.00    Types: Cigarettes  . Smokeless tobacco: Never Used  Vaping Use  . Vaping Use: Never used  Substance Use Topics  . Alcohol use: No  . Drug use: No    Allergies  Allergen Reactions  . Percocet [Oxycodone-Acetaminophen] Itching    And nausea  . Oxycodone-Acetaminophen Nausea And Vomiting and Nausea Only    And feels like bugs crawling on skin     Current Outpatient Medications  Medication Sig Dispense Refill  . albuterol (PROVENTIL) (2.5 MG/3ML) 0.083% nebulizer solution  USE 1 VIAL IN NEBULIZER EVERY 6 HOURS AS NEEDED FOR WHEEZING OR SHORTNESS OF BREATH. 360 mL 0  . albuterol (VENTOLIN HFA) 108 (90 Base) MCG/ACT inhaler INHALE 1-2 PUFFS EVERY 4 HOURS AS NEEDED FOR WHEEZING AND FOR SHORTNESS OF BREATH. 18 g 0  . amitriptyline (ELAVIL) 25 MG tablet Take 1-2 tablets (25-50 mg total) by mouth at bedtime. (Needs to be seen before next refill) (Patient taking differently: Take 25-50 mg by mouth at bedtime as needed. (Needs to be seen before next refill)) 60 tablet 0  . budesonide-formoterol (SYMBICORT) 160-4.5 MCG/ACT inhaler Inhale 2 puffs into the lungs 2 (two) times daily. (Needs to be  seen before next refill) 6 g 0  . cyclobenzaprine (FLEXERIL) 10 MG tablet Take 1 tablet (10 mg total) by mouth at bedtime. 30 tablet 0  . esomeprazole (NEXIUM) 40 MG capsule TAKE 1 CAPSULE BY MOUTH ONCE DAILY. 30 capsule 0  . IBU 800 MG tablet TAKE 1 TABLET EVERY 8 HOURS AS NEEDED. 60 tablet 1  . INCRUSE ELLIPTA 62.5 MCG/INH AEPB INHALE 1 PUFF INTO THE LUNGS ONCE A DAY. 30 each 0  . ipratropium (ATROVENT) 0.06 % nasal spray Place 1 spray into both nostrils daily as needed for rhinitis.    Marland Kitchen PARoxetine (PAXIL) 10 MG tablet Take 1 tablet (10 mg total) by mouth daily. 30 tablet 1  . Respiratory Therapy Supplies (NEBULIZER/TUBING/MOUTHPIECE) KIT Use w/ nebulizer as needed 1 kit 0  . HYDROcodone-acetaminophen (NORCO/VICODIN) 5-325 MG tablet Take 1 tablet by mouth every 4 (four) hours as needed for up to 5 days for moderate pain. 30 tablet 0   No current facility-administered medications for this visit.     Physical Exam  Blood pressure (!) 142/98, pulse 82, height 5' 7"  (1.702 m), weight 262 lb (118.8 kg).  Constitutional: overall normal hygiene, normal nutrition, well developed, normal grooming, normal body habitus. Assistive device:cane  Musculoskeletal: gait and station Limp right, muscle tone and strength are normal, no tremors or atrophy is present.  .  Neurological: coordination overall normal.  Deep tendon reflex/nerve stretch intact.  Sensation normal.  Cranial nerves II-XII intact.   Skin:   Normal overall no scars, lesions, ulcers or rashes. No psoriasis.  Psychiatric: Alert and oriented x 3.  Recent memory intact, remote memory unclear.  Normal mood and affect. Well groomed.  Good eye contact.  Cardiovascular: overall no swelling, no varicosities, no edema bilaterally, normal temperatures of the legs and arms, no clubbing, cyanosis and good capillary refill.  Lymphatic: palpation is normal.  Right knee with pain, ROM 0 to 100, crepitus, effusion, positive medial McMurray.  NV  intact.  Limp right.  All other systems reviewed and are negative   The patient has been educated about the nature of the problem(s) and counseled on treatment options.  The patient appeared to understand what I have discussed and is in agreement with it.  Encounter Diagnosis  Name Primary?  . Chronic pain of right knee Yes    PLAN Call if any problems.  Precautions discussed.  Continue current medications.   Return to clinic To see Dr. Aline Brochure  I have reviewed the [facility] Controlled Substance Reporting System web site prior to prescribing narcotic medicine for this patient.   Electronically [place], MD 7/1/202110:44 AM

## 2019-07-17 NOTE — Patient Instructions (Signed)

## 2019-07-18 NOTE — Telephone Encounter (Signed)
Last office visit 05/26/2019

## 2019-07-22 ENCOUNTER — Other Ambulatory Visit: Payer: Self-pay

## 2019-07-22 ENCOUNTER — Ambulatory Visit (INDEPENDENT_AMBULATORY_CARE_PROVIDER_SITE_OTHER): Payer: Medicare HMO | Admitting: Orthopedic Surgery

## 2019-07-22 VITALS — BP 138/89 | HR 82 | Ht 67.0 in | Wt 262.0 lb

## 2019-07-22 DIAGNOSIS — F17210 Nicotine dependence, cigarettes, uncomplicated: Secondary | ICD-10-CM

## 2019-07-22 DIAGNOSIS — M23321 Other meniscus derangements, posterior horn of medial meniscus, right knee: Secondary | ICD-10-CM | POA: Diagnosis not present

## 2019-07-22 DIAGNOSIS — M25561 Pain in right knee: Secondary | ICD-10-CM

## 2019-07-22 DIAGNOSIS — G8929 Other chronic pain: Secondary | ICD-10-CM

## 2019-07-22 DIAGNOSIS — Z6841 Body Mass Index (BMI) 40.0 and over, adult: Secondary | ICD-10-CM

## 2019-07-22 MED ORDER — HYDROCODONE-ACETAMINOPHEN 5-325 MG PO TABS: 1.0000 | ORAL_TABLET | ORAL | 0 refills | Status: AC | PRN

## 2019-07-22 NOTE — Patient Instructions (Addendum)
Meniscus Injury, Arthroscopy   Arthroscopy is a surgical procedure that involves the use of a small scope that has a camera and surgical instruments on the end (arthroscope). An arthroscope can be used to repair your meniscus injury.  LET [facility] CARE PROVIDER KNOW ABOUT:  Any allergies you have.  All medicines you are taking, including vitamins, herbs, eyedrops, creams, and over-the-counter medicines.  Any recent colds or infections you have had or currently have.  Previous problems you or members of your family have had with the use of anesthetics.  Any blood disorders or blood clotting problems you have.  Previous surgeries you have had.  Medical conditions you have. RISKS AND COMPLICATIONS Generally, this is a safe procedure. However, as with any procedure, problems can occur. Possible problems include:  Damage to nerves or blood vessels.  Excess bleeding.  Blood clots.  Infection. BEFORE THE PROCEDURE  Do not eat or drink for 6-8 hours before the procedure.  Take medicines as directed by your surgeon. Ask your surgeon about changing or stopping your regular medicines.  You may have lab tests the morning of surgery. PROCEDURE  You will be given one of the following:   A medicine that numbs the area (local anesthesia).  A medicine that makes you go to sleep (general anesthesia).  A medicine injected into your spine that numbs your body below the waist (spinal anesthesia). Most often, several small cuts (incisions) are made in the knee. The arthroscope and instruments go into the incisions to repair the damage. The torn portion of the meniscus is removed.   AFTER THE PROCEDURE  You will be taken to the recovery area where your progress will be monitored. When you are awake, stable, and taking fluids without complications, you will be allowed to go home. This is usually the same day. A torn or stretched ligament (ligament sprain) may take 6-8 weeks to heal.   It  takes about the 4-6 WEEKS if your surgeon removed a torn meniscus.  A repaired meniscus may require 6-12 weeks of recovery time.  A torn ligament needing reconstructive surgery may take 6-12 months to heal fully.   This information is not intended to replace advice given to you by your health care provider. Make sure you discuss any questions you have with your health care provider. You have decided to proceed with operative arthroscopy of the knee. You have decided not to continue with nonoperative measures such as but not limited to oral medication, weight loss, activity modification, physical therapy, bracing, or injection.  We will perform operative arthroscopy of the knee. Some of the risks associated with arthroscopic surgery of the knee include but are not limited to Bleeding Infection Swelling Stiffness Blood clot Pain Need for knee replacement surgery    In compliance with recent [place] law in federal regulation regarding opioid use and abuse and addiction, we will taper (stop) opioid medication after 2 weeks.  If you're not comfortable with these risks and would like to continue with nonoperative treatment please let Dr. Aline Brochure know prior to your surgery.   Steps to Quit Smoking Smoking tobacco is the leading cause of preventable death. It can affect almost every organ in the body. Smoking puts you and those around you at risk for developing many serious chronic diseases. Quitting smoking can be difficult, but it is one of the best things that you can do for your health. It is never too late to quit. How do I get ready to  quit? When you decide to quit smoking, create a plan to help you succeed. Before you quit:  Pick a date to quit. Set a date within the next 2 weeks to give you time to prepare.  Write down the reasons why you are quitting. Keep this list in places where you will see it often.  Tell your family, friends, and co-workers that you are quitting.  Support from your loved ones can make quitting easier.  Talk with your health care provider about your options for quitting smoking.  Find out what treatment options are covered by your health insurance.  Identify people, places, things, and activities that make you want to smoke (triggers). Avoid them. What first steps can I take to quit smoking?  Throw away all cigarettes at home, at work, and in your car.  Throw away smoking accessories, such as Scientist, research (medical).  Clean your car. Make sure to empty the ashtray.  Clean your home, including curtains and carpets. What strategies can I use to quit smoking? Talk with your health care provider about combining strategies, such as taking medicines while you are also receiving in-person counseling. Using these two strategies together makes you more likely to succeed in quitting than if you used either strategy on its own.  If you are pregnant or breastfeeding, talk with your health care provider about finding counseling or other support strategies to quit smoking. Do not take medicine to help you quit smoking unless your health care provider tells you to do so. To quit smoking: Quit right away  Quit smoking completely, instead of gradually reducing how much you smoke over a period of time. Research shows that stopping smoking right away is more successful than gradually quitting.  Attend in-person counseling to help you build problem-solving skills. You are more likely to succeed in quitting if you attend counseling sessions regularly. Even short sessions of 10 minutes can be effective. Take medicine You may take medicines to help you quit smoking. Some medicines require a prescription and some you can purchase over-the-counter. Medicines may have nicotine in them to replace the nicotine in cigarettes. Medicines may:  Help to stop cravings.  Help to relieve withdrawal symptoms. Your health care provider may recommend:  Nicotine  patches, gum, or lozenges.  Nicotine inhalers or sprays.  Non-nicotine medicine that is taken by mouth. Find resources Find resources and support systems that can help you to quit smoking and remain smoke-free after you quit. These resources are most helpful when you use them often. They include:  Online chats with a Social worker.  Telephone quitlines.  Printed Furniture conservator/restorer.  Support groups or group counseling.  Text messaging programs.  Mobile phone apps or applications. Use apps that can help you stick to your quit plan by providing reminders, tips, and encouragement. There are many free apps for mobile devices as well as websites. Examples include Quit Guide from the [employer] and smokefree.gov What things can I do to make it easier to quit?   Reach out to your family and friends for support and encouragement. Call telephone quitlines (1-800-QUIT-NOW), reach out to support groups, or work with a counselor for support.  Ask people who smoke to avoid smoking around you.  Avoid places that trigger you to smoke, such as bars, parties, or smoke-break areas at work.  Spend time with people who do not smoke.  Lessen the stress in your life. Stress can be a smoking trigger for some people. To lessen stress, try: ? Exercising  regularly. ? Doing deep-breathing exercises. ? Doing yoga. ? Meditating. ? Performing a body scan. This involves closing your eyes, scanning your body from head to toe, and noticing which parts of your body are particularly tense. Try to relax the muscles in those areas. How will I feel when I quit smoking? Day 1 to 3 weeks Within the first 24 hours of quitting smoking, you may start to feel withdrawal symptoms. These symptoms are usually most noticeable 2-3 days after quitting, but they usually do not last for more than 2-3 weeks. You may experience these symptoms:  Mood swings.  Restlessness, anxiety, or irritability.  Trouble  concentrating.  Dizziness.  Strong cravings for sugary foods and nicotine.  Mild weight gain.  Constipation.  Nausea.  Coughing or a sore throat.  Changes in how the medicines that you take for unrelated issues work in your body.  Depression.  Trouble sleeping (insomnia). Week 3 and afterward After the first 2-3 weeks of quitting, you may start to notice more positive results, such as:  Improved sense of smell and taste.  Decreased coughing and sore throat.  Slower heart rate.  Lower blood pressure.  Clearer skin.  The ability to breathe more easily.  Fewer sick days. Quitting smoking can be very challenging. Do not get discouraged if you are not successful the first time. Some people need to make many attempts to quit before they achieve long-term success. Do your best to stick to your quit plan, and talk with your health care provider if you have any questions or concerns. Summary  Smoking tobacco is the leading cause of preventable death. Quitting smoking is one of the best things that you can do for your health.  When you decide to quit smoking, create a plan to help you succeed.  Quit smoking right away, not slowly over a period of time.  When you start quitting, seek help from your health care provider, family, or friends. This information is not intended to replace advice given to you by your health care provider. Make sure you discuss any questions you have with your health care provider. Document Revised: 09/27/2018 Document Reviewed: 03/23/2018 Elsevier Patient Education  [place] for [employer] Calories are units of energy. Your body needs a certain amount of calories from food to keep you going throughout the day. When you eat more calories than your body needs, your body stores the extra calories as fat. When you eat fewer calories than your body needs, your body burns fat to get the energy it needs. Calorie counting means  keeping track of how many calories you eat and drink each day. Calorie counting can be helpful if you need to lose weight. If you make sure to eat fewer calories than your body needs, you should lose weight. Ask your health care provider what a healthy weight is for you. For calorie counting to work, you will need to eat the right number of calories in a day in order to lose a healthy amount of weight per week. A dietitian can help you determine how many calories you need in a day and will give you suggestions on how to reach your calorie goal.  A healthy amount of weight to lose per week is usually 1-2 lb (0.5-0.9 kg). This usually means that your daily calorie intake should be reduced by 500-750 calories.  Eating 1,200 - 1,500 calories per day can help most women lose weight.  Eating 1,500 - 1,800  calories per day can help most men lose weight. What is my plan? My goal is to have __________ calories per day. If I have this many calories per day, I should lose around __________ pounds per week. What do I need to know about calorie counting? In order to meet your daily calorie goal, you will need to:  Find out how many calories are in each food you would like to eat. Try to do this before you eat.  Decide how much of the food you plan to eat.  Write down what you ate and how many calories it had. Doing this is called keeping a food log. To successfully lose weight, it is important to balance calorie counting with a healthy lifestyle that includes regular activity. Aim for 150 minutes of moderate exercise (such as walking) or 75 minutes of vigorous exercise (such as running) each week. Where do I find calorie information?  The number of calories in a food can be found on a Nutrition Facts label. If a food does not have a Nutrition Facts label, try to look up the calories online or ask your dietitian for help. Remember that calories are listed per serving. If you choose to have more than one  serving of a food, you will have to multiply the calories per serving by the amount of servings you plan to eat. For example, the label on a package of bread might say that a serving size is 1 slice and that there are 90 calories in a serving. If you eat 1 slice, you will have eaten 90 calories. If you eat 2 slices, you will have eaten 180 calories. How do I keep a food log? Immediately after each meal, record the following information in your food log:  What you ate. Don't forget to include toppings, sauces, and other extras on the food.  How much you ate. This can be measured in cups, ounces, or number of items.  How many calories each food and drink had.  The total number of calories in the meal. Keep your food log near you, such as in a small notebook in your pocket, or use a mobile app or website. Some programs will calculate calories for you and show you how many calories you have left for the day to meet your goal. What are some calorie counting tips?   Use your calories on foods and drinks that will fill you up and not leave you hungry: ? Some examples of foods that fill you up are nuts and nut butters, vegetables, lean proteins, and high-fiber foods like whole grains. High-fiber foods are foods with more than 5 g fiber per serving. ? Drinks such as sodas, specialty coffee drinks, alcohol, and juices have a lot of calories, yet do not fill you up.  Eat nutritious foods and avoid empty calories. Empty calories are calories you get from foods or beverages that do not have many vitamins or protein, such as candy, sweets, and soda. It is better to have a nutritious high-calorie food (such as an avocado) than a food with few nutrients (such as a bag of chips).  Know how many calories are in the foods you eat most often. This will help you calculate calorie counts faster.  Pay attention to calories in drinks. Low-calorie drinks include water and unsweetened drinks.  Pay attention to  nutrition labels for "low fat" or "fat free" foods. These foods sometimes have the same amount of calories or more calories than the  full fat versions. They also often have added sugar, starch, or salt, to make up for flavor that was removed with the fat.  Find a way of tracking calories that works for you. Get creative. Try different apps or programs if writing down calories does not work for you. What are some portion control tips?  Know how many calories are in a serving. This will help you know how many servings of a certain food you can have.  Use a measuring cup to measure serving sizes. You could also try weighing out portions on a kitchen scale. With time, you will be able to estimate serving sizes for some foods.  Take some time to put servings of different foods on your favorite plates, bowls, and cups so you know what a serving looks like.  Try not to eat straight from a bag or box. Doing this can lead to overeating. Put the amount you would like to eat in a cup or on a plate to make sure you are eating the right portion.  Use smaller plates, glasses, and bowls to prevent overeating.  Try not to multitask (for example, watch TV or use your computer) while eating. If it is time to eat, sit down at a table and enjoy your food. This will help you to know when you are full. It will also help you to be aware of what you are eating and how much you are eating. What are tips for following this plan? Reading food labels  Check the calorie count compared to the serving size. The serving size may be smaller than what you are used to eating.  Check the source of the calories. Make sure the food you are eating is high in vitamins and protein and low in saturated and trans fats. Shopping  Read nutrition labels while you shop. This will help you make healthy decisions before you decide to purchase your food.  Make a grocery list and stick to it. Cooking  Try to cook your favorite foods in a  healthier way. For example, try baking instead of frying.  Use low-fat dairy products. Meal planning  Use more fruits and vegetables. Half of your plate should be fruits and vegetables.  Include lean proteins like poultry and fish. How do I count calories when eating out?  Ask for smaller portion sizes.  Consider sharing an entree and sides instead of getting your own entree.  If you get your own entree, eat only half. Ask for a box at the beginning of your meal and put the rest of your entree in it so you are not tempted to eat it.  If calories are listed on the menu, choose the lower calorie options.  Choose dishes that include vegetables, fruits, whole grains, low-fat dairy products, and lean protein.  Choose items that are boiled, broiled, grilled, or steamed. Stay away from items that are buttered, battered, fried, or served with cream sauce. Items labeled "crispy" are usually fried, unless stated otherwise.  Choose water, low-fat milk, unsweetened iced tea, or other drinks without added sugar. If you want an alcoholic beverage, choose a lower calorie option such as a glass of wine or light beer.  Ask for dressings, sauces, and syrups on the side. These are usually high in calories, so you should limit the amount you eat.  If you want a salad, choose a garden salad and ask for grilled meats. Avoid extra toppings like bacon, cheese, or fried items. Ask for the dressing  on the side, or ask for olive oil and vinegar or lemon to use as dressing.  Estimate how many servings of a food you are given. For example, a serving of cooked rice is  cup or about the size of half a baseball. Knowing serving sizes will help you be aware of how much food you are eating at restaurants. The list below tells you how big or small some common portion sizes are based on everyday objects: ? 1 oz--4 stacked dice. ? 3 oz--1 deck of cards. ? 1 tsp--1 die. ? 1 Tbsp-- a ping-pong ball. ? 2 Tbsp--1  ping-pong ball. ?  cup-- baseball. ? 1 cup--1 baseball. Summary  Calorie counting means keeping track of how many calories you eat and drink each day. If you eat fewer calories than your body needs, you should lose weight.  A healthy amount of weight to lose per week is usually 1-2 lb (0.5-0.9 kg). This usually means reducing your daily calorie intake by 500-750 calories.  The number of calories in a food can be found on a Nutrition Facts label. If a food does not have a Nutrition Facts label, try to look up the calories online or ask your dietitian for help.  Use your calories on foods and drinks that will fill you up, and not on foods and drinks that will leave you hungry.  Use smaller plates, glasses, and bowls to prevent overeating. This information is not intended to replace advice given to you by your health care provider. Make sure you discuss any questions you have with your health care provider. Document Revised: 09/21/2017 Document Reviewed: 12/03/2015 Elsevier Patient Education  [place].

## 2019-07-22 NOTE — Telephone Encounter (Signed)
Calling to check on status of this message. Please call back

## 2019-07-22 NOTE — Progress Notes (Signed)
[place]  Chief Complaint  Patient presents with  . Knee Pain    R/here for surgery consult/    64 year old female with COPD over 1 year history of pain right knee unrelieved by nonoperative measures.  Patient seen by Dr. Luna Glasgow eventually had an MRI and x-rays which showed osteoarthritis of the medial and patellofemoral compartments with a torn medial meniscus  Patient complains of global knee pain catching and giving way   Review of Systems  Respiratory: Positive for shortness of breath.   Musculoskeletal: Positive for joint pain.     Past Medical History:  Diagnosis Date  . Anxiety   . Arthritis   . Basal cell carcinoma    not biopsy confirmed; Right bridge of nose  . Breast cancer ([place])   . COPD (chronic obstructive pulmonary disease) ([place])   . COPD, moderate ([place])   . Depression   . GERD (gastroesophageal reflux disease)   . Shortness of breath dyspnea   . Umbilical hernia     Past Surgical History:  Procedure Laterality Date  . BREAST LUMPECTOMY Right 07/15/2012   Procedure: BREAST LUMPECTOMY WITH AXILLARY LYMPH NODE BIOPSY;  Surgeon: Harl Bowie, MD;  Location: [facility];  Service: General;  Laterality: Right;  Nuclear medicine injection 9:30   . CHOLECYSTECTOMY    . COLONOSCOPY N/A 03/16/2015   Procedure: COLONOSCOPY;  Surgeon: Danie Binder, MD;  [facility];  Service: Endoscopy;  Laterality: N/A;  2:30 PM - moved to 12:30 - office to notify pt  . TUBAL LIGATION      Family History  Problem Relation Age of Onset  . Diabetes Mother   . Stroke Mother   . Multiple sclerosis Mother   . Cancer Paternal Aunt    Social History   Tobacco Use  . Smoking status: Current Some Day Smoker    Packs/day: 0.50    Years: 40.00    Pack years: 20.00    Types: Cigarettes  . Smokeless tobacco: Never Used  Vaping Use  . Vaping Use: Never used  Substance Use Topics  . Alcohol use: No  . Drug use: No    Allergies  Allergen Reactions  .  Percocet [Oxycodone-Acetaminophen] Itching    And nausea  . Oxycodone-Acetaminophen Nausea And Vomiting and Nausea Only    And feels like bugs crawling on skin      Current Meds  Medication Sig  . albuterol (PROVENTIL) (2.5 MG/3ML) 0.083% nebulizer solution USE 1 VIAL IN NEBULIZER EVERY 6 HOURS AS NEEDED FOR WHEEZING OR SHORTNESS OF BREATH.  Marland Kitchen albuterol (VENTOLIN HFA) 108 (90 Base) MCG/ACT inhaler INHALE 1-2 PUFFS EVERY 4 HOURS AS NEEDED FOR WHEEZING AND FOR SHORTNESS OF BREATH.  Marland Kitchen amitriptyline (ELAVIL) 25 MG tablet Take 1-2 tablets (25-50 mg total) by mouth at bedtime. (Needs to be seen before next refill) (Patient taking differently: Take 25-50 mg by mouth at bedtime as needed. (Needs to be seen before next refill))  . budesonide-formoterol (SYMBICORT) 160-4.5 MCG/ACT inhaler Inhale 2 puffs into the lungs 2 (two) times daily. (Needs to be seen before next refill)  . cyclobenzaprine (FLEXERIL) 10 MG tablet Take 1 tablet (10 mg total) by mouth at bedtime.  Marland Kitchen esomeprazole (NEXIUM) 40 MG capsule TAKE 1 CAPSULE BY MOUTH ONCE DAILY.  Marland Kitchen HYDROcodone-acetaminophen (NORCO/VICODIN) 5-325 MG tablet Take 1 tablet by mouth every 4 (four) hours as needed for up to 5 days for moderate pain.  . IBU 800 MG tablet TAKE 1 TABLET EVERY 8  HOURS AS NEEDED.  Marland Kitchen INCRUSE ELLIPTA 62.5 MCG/INH AEPB INHALE 1 PUFF INTO THE LUNGS ONCE A DAY.  Marland Kitchen ipratropium (ATROVENT) 0.06 % nasal spray Place 1 spray into both nostrils daily as needed for rhinitis.  Marland Kitchen PARoxetine (PAXIL) 10 MG tablet Take 1 tablet (10 mg total) by mouth daily.  Marland Kitchen Respiratory Therapy Supplies (NEBULIZER/TUBING/MOUTHPIECE) KIT Use w/ nebulizer as needed  . [DISCONTINUED] HYDROcodone-acetaminophen (NORCO/VICODIN) 5-325 MG tablet Take 1 tablet by mouth every 4 (four) hours as needed for up to 5 days for moderate pain.    BP 138/89   Pulse 82   Ht 5' 7" (1.702 m)   Wt 262 lb (118.8 kg)   BMI 41.04 kg/m   The patient meets the AMA guidelines for Morbid  (severe) obesity with a BMI > 40.0 and I have recommended weight loss.  Physical Exam Constitutional:      General: She is not in acute distress.    Appearance: She is well-developed.  Cardiovascular:     Comments: No peripheral edema Skin:    General: Skin is warm and dry.  Neurological:     Mental Status: She is alert and oriented to person, place, and time.     Sensory: No sensory deficit.     Coordination: Coordination normal.     Gait: Gait normal.     Deep Tendon Reflexes: Reflexes are normal and symmetric.     Ortho Exam Right knee  Tenderness medial compartment and lateral compartment no joint effusion detected patient's range of motion is 5 to 125 degrees with stable ligaments muscle tone is normal  Skin is intact  Patient complains of radiating right hip and leg pain as well  MEDICAL DECISION SECTION  xrays ordered?  No  My independent reading of xrays: Yes  Plain films show x-rays of the knee with osteoarthritis  MRI of the knee shows a torn medial meniscus with global arthritis     Encounter Diagnoses  Name Primary?  . Chronic pain of right knee Yes  . Nicotine dependence, cigarettes, uncomplicated   . Body mass index 40.0-44.9, adult ([place])   . Morbid obesity ([place])   . Derangement of posterior horn of medial meniscus of right knee      PLAN:    Surgical procedure planned: Arthroscopy right knee and partial medial meniscectomy  The procedure has been fully reviewed with the patient; The risks and benefits of surgery have been discussed and explained and understood. Alternative treatment has also been reviewed, questions were encouraged and answered. The postoperative plan is also been reviewed.  Nonsurgical treatment as described in the history and physical section was attempted and unsuccessful and the patient has agreed to proceed with surgical intervention to improve their situation.  Meds ordered this encounter  Medications  .  HYDROcodone-acetaminophen (NORCO/VICODIN) 5-325 MG tablet    Sig: Take 1 tablet by mouth every 4 (four) hours as needed for up to 5 days for moderate pain.    Dispense:  30 tablet    Refill:  0    Arther Abbott, MD 07/22/2019 4:07 PM

## 2019-07-30 ENCOUNTER — Encounter: Payer: Self-pay | Admitting: Orthopedic Surgery

## 2019-07-30 NOTE — Progress Notes (Signed)
No prior auth needed for 29881, OP, per Vicente Males at [place], [place] # [id number]. DOS planned 08/07/2019.

## 2019-07-31 MED ORDER — NICOTINE 21 MG/24HR TD PT24: 21.0000 mg | MEDICATED_PATCH | Freq: Every day | TRANSDERMAL | 1 refills | Status: DC

## 2019-07-31 NOTE — Telephone Encounter (Signed)
NA and couldn't leave message. Faxed both rx for walker and for mask and tubing supplies to [facility].

## 2019-07-31 NOTE — Telephone Encounter (Signed)
Sent prescription for nicotine patches and printed the prescriptions for the other 2 that we will have to fax over. Caryl Pina, MD [facility] 07/31/2019, 7:55 AM

## 2019-08-01 ENCOUNTER — Telehealth: Payer: Self-pay | Admitting: Family Medicine

## 2019-08-01 NOTE — Telephone Encounter (Signed)
  Prescription Request  08/01/2019  What is the name of the medication or equipment? Walker with seat on it  Have you contacted your pharmacy to request a refill? (if applicable) Yes  Which pharmacy would you like this sent to? [facility]   Patient notified that their request is being sent to the clinical staff for review and that they should receive a response within 2 business days.   [name]'s pt.  She got patches-quit smoking & Nebulizer supplies.  She ONLY needs walker with seat on it!  Please call pt.

## 2019-08-01 NOTE — Telephone Encounter (Signed)
Order reprinted and faxed

## 2019-08-02 ENCOUNTER — Other Ambulatory Visit: Payer: Self-pay | Admitting: Family Medicine

## 2019-08-02 DIAGNOSIS — J439 Emphysema, unspecified: Secondary | ICD-10-CM

## 2019-08-04 ENCOUNTER — Telehealth: Payer: Self-pay | Admitting: Radiology

## 2019-08-04 NOTE — Telephone Encounter (Signed)
Called [employer] today Ref # for the call is [id number] there is no prior authorization required for the surgery on 08/07/19 CPT code [phone]

## 2019-08-04 NOTE — Patient Instructions (Signed)
Tracy Neal  08/04/2019     @PREFPERIOPPHARMACY @   Your procedure is scheduled on 08/07/2019.  Report to [name] at 6:15 A.M.  Call this number if you have problems the morning of surgery:  [phone]   Remember:  Do not eat or drink after midnight.     Take these medicines the morning of surgery with A SIP OF WATER : Nexium and Paxil    Do not wear jewelry, make-up or nail polish.  Do not wear lotions, powders, or perfumes, or deodorant.  Do not shave 48 hours prior to surgery.  Men may shave face and neck.  Do not bring valuables to the hospital.  [facility] is not responsible for any belongings or valuables.  Contacts, dentures or bridgework may not be worn into surgery.  Leave your suitcase in the car.  After surgery it may be brought to your room.  For patients admitted to the hospital, discharge time will be determined by your treatment team.  Patients discharged the day of surgery will not be allowed to drive home.   Name and phone number of your driver:   n/a Special instructions:  N/a Please use the Hibiclens Wash the night prior to surgery and the am of surgery.  How to Use Chlorhexidine for Bathing Chlorhexidine gluconate (CHG) is a germ-killing (antiseptic) solution that is used to clean the skin. It can get rid of the bacteria that normally live on the skin and can keep them away for about 24 hours. To clean your skin with CHG, you may be given:  A CHG solution to use in the shower or as part of a sponge bath.  A prepackaged cloth that contains CHG. Cleaning your skin with CHG may help lower the risk for infection:  While you are staying in the intensive care unit of the hospital.  If you have a vascular access, such as a central line, to provide short-term or long-term access to your veins.  If you have a catheter to drain urine from your bladder.  If you are on a ventilator. A ventilator is a machine that helps you breathe by moving air in  and out of your lungs.  After surgery. What are the risks? Risks of using CHG include:  A skin reaction.  Hearing loss, if CHG gets in your ears.  Eye injury, if CHG gets in your eyes and is not rinsed out.  The CHG product catching fire. Make sure that you avoid smoking and flames after applying CHG to your skin. Do not use CHG:  If you have a chlorhexidine allergy or have previously reacted to chlorhexidine.  On babies younger than 28 months of age. How to use CHG solution  Use CHG only as told by your health care provider, and follow the instructions on the label.  Use the full amount of CHG as directed. Usually, this is one bottle. During a shower Follow these steps when using CHG solution during a shower (unless your health care provider gives you different instructions): 1. Start the shower. 2. Use your normal soap and shampoo to wash your face and hair. 3. Turn off the shower or move out of the shower stream. 4. Pour the CHG onto a clean washcloth. Do not use any type of brush or rough-edged sponge. 5. Starting at your neck, lather your body down to your toes. Make sure you follow these instructions: ? If you will be having surgery, pay special attention to the  part of your body where you will be having surgery. Scrub this area for at least 1 minute. ? Do not use CHG on your head or face. If the solution gets into your ears or eyes, rinse them well with water. ? Avoid your genital area. ? Avoid any areas of skin that have broken skin, cuts, or scrapes. ? Scrub your back and under your arms. Make sure to wash skin folds. 6. Let the lather sit on your skin for 1-2 minutes or as long as told by your health care provider. 7. Thoroughly rinse your entire body in the shower. Make sure that all body creases and crevices are rinsed well. 8. Dry off with a clean towel. Do not put any substances on your body afterward--such as powder, lotion, or perfume--unless you are told to do so  by your health care provider. Only use lotions that are recommended by the manufacturer. 9. Put on clean clothes or pajamas. 10. If it is the night before your surgery, sleep in clean sheets.  During a sponge bath Follow these steps when using CHG solution during a sponge bath (unless your health care provider gives you different instructions): 1. Use your normal soap and shampoo to wash your face and hair. 2. Pour the CHG onto a clean washcloth. 3. Starting at your neck, lather your body down to your toes. Make sure you follow these instructions: ? If you will be having surgery, pay special attention to the part of your body where you will be having surgery. Scrub this area for at least 1 minute. ? Do not use CHG on your head or face. If the solution gets into your ears or eyes, rinse them well with water. ? Avoid your genital area. ? Avoid any areas of skin that have broken skin, cuts, or scrapes. ? Scrub your back and under your arms. Make sure to wash skin folds. 4. Let the lather sit on your skin for 1-2 minutes or as long as told by your health care provider. 5. Using a different clean, wet washcloth, thoroughly rinse your entire body. Make sure that all body creases and crevices are rinsed well. 6. Dry off with a clean towel. Do not put any substances on your body afterward--such as powder, lotion, or perfume--unless you are told to do so by your health care provider. Only use lotions that are recommended by the manufacturer. 7. Put on clean clothes or pajamas. 8. If it is the night before your surgery, sleep in clean sheets. How to use CHG prepackaged cloths  Only use CHG cloths as told by your health care provider, and follow the instructions on the label.  Use the CHG cloth on clean, dry skin.  Do not use the CHG cloth on your head or face unless your health care provider tells you to.  When washing with the CHG cloth: ? Avoid your genital area. ? Avoid any areas of skin that  have broken skin, cuts, or scrapes. Before surgery Follow these steps when using a CHG cloth to clean before surgery (unless your health care provider gives you different instructions): 1. Using the CHG cloth, vigorously scrub the part of your body where you will be having surgery. Scrub using a back-and-forth motion for 3 minutes. The area on your body should be completely wet with CHG when you are done scrubbing. 2. Do not rinse. Discard the cloth and let the area air-dry. Do not put any substances on the area afterward, such as  powder, lotion, or perfume. 3. Put on clean clothes or pajamas. 4. If it is the night before your surgery, sleep in clean sheets.  For general bathing Follow these steps when using CHG cloths for general bathing (unless your health care provider gives you different instructions). 1. Use a separate CHG cloth for each area of your body. Make sure you wash between any folds of skin and between your fingers and toes. Wash your body in the following order, switching to a new cloth after each step: ? The front of your neck, shoulders, and chest. ? Both of your arms, under your arms, and your hands. ? Your stomach and groin area, avoiding the genitals. ? Your right leg and foot. ? Your left leg and foot. ? The back of your neck, your back, and your buttocks. 2. Do not rinse. Discard the cloth and let the area air-dry. Do not put any substances on your body afterward--such as powder, lotion, or perfume--unless you are told to do so by your health care provider. Only use lotions that are recommended by the manufacturer. 3. Put on clean clothes or pajamas. Contact a health care provider if:  Your skin gets irritated after scrubbing.  You have questions about using your solution or cloth. Get help right away if:  Your eyes become very red or swollen.  Your eyes itch badly.  Your skin itches badly and is red or swollen.  Your hearing changes.  You have trouble  seeing.  You have swelling or tingling in your mouth or throat.  You have trouble breathing.  You swallow any chlorhexidine. Summary  Chlorhexidine gluconate (CHG) is a germ-killing (antiseptic) solution that is used to clean the skin. Cleaning your skin with CHG may help to lower your risk for infection.  You may be given CHG to use for bathing. It may be in a bottle or in a prepackaged cloth to use on your skin. Carefully follow your health care provider's instructions and the instructions on the product label.  Do not use CHG if you have a chlorhexidine allergy.  Contact your health care provider if your skin gets irritated after scrubbing. This information is not intended to replace advice given to you by your health care provider. Make sure you discuss any questions you have with your health care provider. Document Revised: 03/21/2018 Document Reviewed: 11/30/2016 Elsevier Patient Education  [place].   Please read over the following fact sheets that you were given. Care and Recovery After Surgery    Knee Arthroscopy Knee arthroscopy is a surgery to examine the inside of the knee joint and repair any damage to cartilage, surfaces, and other soft tissues around the joint. You may have this surgery if non-surgical treatment has not relieved your symptoms. Knee arthroscopy may be used to:  Repair a torn ligament or other torn tissues. Ligaments are tissues that connect bones to each other.  Remove bone fragments.  Remove a fluid-filled sac (cyst).  Treat kneecap (patella)problems.  Treat an advanced infection in the knee (septicknee). Arthroscopic surgery is done using a thin tube that has a light and camera on the end of it (arthroscope). The arthroscope is placed through a small incision, and the camera sends images to a screen in the operating room. The images are used to help perform the surgery. Tell a health care provider about:  Any allergies you  have.  All medicines you are taking, including vitamins, herbs, eye drops, creams, and over-the-counter medicines.  Any  problems you or family members have had with anesthetic medicines.  Any blood disorders you have.  Any surgeries you have had.  Any medical conditions you have.  Whether you are pregnant or may be pregnant. What are the risks? Generally, this is a safe procedure. However, problems may occur, including:  Infection.  Bleeding.  Allergic reactions to medicines.  Damage to blood vessels, nerves, or tissues in the knee.  A blood clot that forms in the leg and travels to the lung (pulmonary embolism).  Failure of the surgery to relieve symptoms.  Knee stiffness. What happens before the procedure? Staying hydrated Follow instructions from your health care provider about hydration, which may include:  Up to 2 hours before the procedure - you may continue to drink clear liquids, such as water, clear fruit juice, black coffee, and plain tea. Eating and drinking restrictions Follow instructions from your health care provider about eating and drinking, which may include:  8 hours before the procedure - stop eating heavy meals or foods such as meat, fried foods, or fatty foods.  6 hours before the procedure - stop eating light meals or foods, such as toast or cereal.  6 hours before the procedure - stop drinking milk or drinks that contain milk.  2 hours before the procedure - stop drinking clear liquids. Medicines Ask your health care provider about:  Changing or stopping your regular medicines. This is especially important if you are taking diabetes medicines or blood thinners.  Taking medicines such as aspirin and ibuprofen. These medicines can thin your blood. Do not take these medicines unless your health care provider tells you to take them.  Taking over-the-counter medicines, vitamins, herbs, and supplements. Testing  Your knee may be examined. Your  health care provider may move your knee or ask you to move it in specific ways to see how much motion you have.  You may have blood tests.  You may have imaging tests, such as an X-ray, MRI, or CT scan.  You may have an electrocardiogram. This test records the electrical activity in the heart. General instructions  Do not drink alcohol unless your health care provider says that you can.  Do not use any products that contain nicotine or tobacco, such as cigarettes and e-cigarettes, for one month or more before your surgery. If you need help quitting, ask your health care provider.  Plan to have someone take you home from the hospital or clinic.  Plan to have a responsible adult care for you for at least 24 hours after you leave the hospital or clinic. This is important. What happens during the procedure?   To lower your risk of infection: ? Your health care team will wash or sanitize their hands. ? Hair may be removed from the surgical area. ? Your skin will be washed with soap.  An IV will be inserted into one of your veins.  You will be given one or more of the following: ? A medicine to help you relax (sedative). ? A medicine to numb the knee area (local anesthetic). ? A medicine to make you fall asleep (general anesthetic). ? A medicine that is injected into an area of your body to numb everything below the injection site (regional anesthetic). This may be injected into your groin or thigh.  A cuff may be placed around your upper leg to slow blood flow to your lower leg during the procedure.  Several small incisions will be made around your knee.  Your knee joint will be rinsed (flushed) and filled with a germ-free solution (sterile saline). This expands the area to allow your surgeon to see the joint more clearly.  An arthroscope will be passed through one of your incisions, into your knee joint.  Other surgical instruments will be passed through the other incisions. Then,  your surgeon will examine and repair your knee as needed.  The sterile saline will be drained from your knee, and the cuff will be removed from your upper leg.  Your incisions will be closed with adhesive strips or stitches (sutures) and covered with a bandage (dressing). The procedure may vary among health care providers and hospitals. What happens after the procedure?  Your blood pressure, heart rate, breathing rate, and blood oxygen level will be monitored until the medicines you were given have worn off.  You will be given pain medicine as needed.  You may be given medicine to lower your risk of blood clots.  You may have to wear compression stockings. These stockings help to prevent blood clots and reduce swelling in your legs.  Your health care provider will give you instructions about how much body weight you can safely support on your leg (weight-bearing restrictions). You may be given crutches or other devices to help you move around (assistive devices).  You may be shown how to do physical therapy exercises to help you recover.  Do not drive until your health care provider approves. Summary  Knee arthroscopy is a surgery to examine or repair the inside of the knee joint.  Before the procedure, follow instructions from your health care provider about eating and drinking.  Plan to have someone take you home from the hospital or clinic. This information is not intended to replace advice given to you by your health care provider. Make sure you discuss any questions you have with your health care provider. Document Revised: 12/15/2016 Document Reviewed: 10/05/2016 Elsevier Patient Education  2020 [employer].

## 2019-08-05 ENCOUNTER — Telehealth: Payer: Self-pay | Admitting: Radiology

## 2019-08-05 ENCOUNTER — Other Ambulatory Visit: Payer: Self-pay

## 2019-08-05 ENCOUNTER — Encounter (HOSPITAL_COMMUNITY): Payer: Self-pay

## 2019-08-05 ENCOUNTER — Other Ambulatory Visit (HOSPITAL_COMMUNITY)
Admission: RE | Admit: 2019-08-05 | Discharge: 2019-08-05 | Disposition: A | Discharge status: Home / Self Care | Payer: Medicare HMO | Source: Ambulatory Visit | Attending: Orthopedic Surgery | Admitting: Orthopedic Surgery

## 2019-08-05 ENCOUNTER — Encounter (HOSPITAL_COMMUNITY)
Admission: RE | Admit: 2019-08-05 | Discharge: 2019-08-05 | Disposition: A | Discharge status: Home / Self Care | Payer: Medicare HMO | Source: Ambulatory Visit | Attending: Orthopedic Surgery | Admitting: Orthopedic Surgery

## 2019-08-05 DIAGNOSIS — Z20822 Contact with and (suspected) exposure to covid-19: Secondary | ICD-10-CM | POA: Diagnosis not present

## 2019-08-05 DIAGNOSIS — Z01812 Encounter for preprocedural laboratory examination: Secondary | ICD-10-CM | POA: Diagnosis present

## 2019-08-05 LAB — CBC WITH DIFFERENTIAL/PLATELET
Abs Immature Granulocytes: 0.08 10*3/uL — ABNORMAL HIGH (ref 0.00–0.07)
Basophils Absolute: 0.1 10*3/uL (ref 0.0–0.1)
Basophils Relative: 1 %
Eosinophils Absolute: 0.3 10*3/uL (ref 0.0–0.5)
Eosinophils Relative: 2 %
HCT: 48.8 % — ABNORMAL HIGH (ref 36.0–46.0)
Hemoglobin: 15.6 g/dL — ABNORMAL HIGH (ref 12.0–15.0)
Immature Granulocytes: 1 %
Lymphocytes Relative: 23 %
Lymphs Abs: 3.1 10*3/uL (ref 0.7–4.0)
MCH: 27.7 pg (ref 26.0–34.0)
MCHC: 32 g/dL (ref 30.0–36.0)
MCV: 86.7 fL (ref 80.0–100.0)
Monocytes Absolute: 1.2 10*3/uL — ABNORMAL HIGH (ref 0.1–1.0)
Monocytes Relative: 9 %
Neutro Abs: 9 10*3/uL — ABNORMAL HIGH (ref 1.7–7.7)
Neutrophils Relative %: 64 %
Platelets: 339 10*3/uL (ref 150–400)
RBC: 5.63 MIL/uL — ABNORMAL HIGH (ref 3.87–5.11)
RDW: 14.9 % (ref 11.5–15.5)
WBC: 13.7 10*3/uL — ABNORMAL HIGH (ref 4.0–10.5)
nRBC: 0 % (ref 0.0–0.2)

## 2019-08-05 LAB — BASIC METABOLIC PANEL
Anion gap: 14 (ref 5–15)
BUN: 14 mg/dL (ref 8–23)
CO2: 21 mmol/L — ABNORMAL LOW (ref 22–32)
Calcium: 9.2 mg/dL (ref 8.9–10.3)
Chloride: 102 mmol/L (ref 98–111)
Creatinine, Ser: 0.89 mg/dL (ref 0.44–1.00)
GFR calc Af Amer: >60 mL/min (ref >60)
GFR calc non Af Amer: >60 mL/min (ref >60)
Glucose, Bld: 90 mg/dL (ref 70–99)
Potassium: 4 mmol/L (ref 3.5–5.1)
Sodium: 137 mmol/L (ref 135–145)

## 2019-08-05 NOTE — Telephone Encounter (Signed)
I called patient and advised. LMVM

## 2019-08-05 NOTE — Telephone Encounter (Signed)
Pt says that she went to [facility] and was told that there was no rx for wheelchair with the feet. Pt also has question about the dosage of the cigarette patches--please call back and refax rx.

## 2019-08-05 NOTE — Telephone Encounter (Signed)
Patient actually came into office.  Surgery is this Thursday.  Asked the following-  1- She has some patches to help with d/c smoking, 21 mg patches.  Should she wait until after surgery to use them?    2- She needs a refill of hydrocodone 5mg  please, [facility].   Please advise on both, thanks.

## 2019-08-05 NOTE — Telephone Encounter (Signed)
Pt needs rx for walker with a seat refaxed to [facility] pharmacy. PT is rc for nurse.   I misunderstood pt about the walker.

## 2019-08-05 NOTE — Telephone Encounter (Signed)
LMOVM Rx was written for a walker with a seat, there was not one for a wheelchair. RTC about nicotine patches

## 2019-08-05 NOTE — Telephone Encounter (Signed)
Patches can use now  2. Meds after surgery

## 2019-08-06 ENCOUNTER — Telehealth: Payer: Self-pay

## 2019-08-06 ENCOUNTER — Telehealth: Payer: Self-pay | Admitting: Family Medicine

## 2019-08-06 LAB — SARS CORONAVIRUS 2 (TAT 6-24 HRS): SARS Coronavirus 2: NEGATIVE

## 2019-08-06 NOTE — Telephone Encounter (Signed)
Patient called and let message on voicemail that [facility] in [place] has not received RX for walker with seat.  Please review and call patient

## 2019-08-06 NOTE — Telephone Encounter (Signed)
Form filled out, placed on providers desk and will be faxed to [facility]

## 2019-08-07 ENCOUNTER — Ambulatory Visit (HOSPITAL_COMMUNITY): Payer: Medicare HMO | Admitting: Anesthesiology

## 2019-08-07 ENCOUNTER — Encounter (HOSPITAL_COMMUNITY): Payer: Self-pay | Admitting: Orthopedic Surgery

## 2019-08-07 ENCOUNTER — Ambulatory Visit (HOSPITAL_COMMUNITY)
Admission: RE | Admit: 2019-08-07 | Discharge: 2019-08-07 | Disposition: A | Discharge status: Home / Self Care | Payer: Medicare HMO | Attending: Orthopedic Surgery | Admitting: Orthopedic Surgery

## 2019-08-07 ENCOUNTER — Encounter (HOSPITAL_COMMUNITY)
Admission: RE | Disposition: A | Discharge status: Home / Self Care | Payer: Self-pay | Source: Home / Self Care | Attending: Orthopedic Surgery

## 2019-08-07 ENCOUNTER — Encounter: Payer: Self-pay | Admitting: Orthopedic Surgery

## 2019-08-07 DIAGNOSIS — Z6841 Body Mass Index (BMI) 40.0 and over, adult: Secondary | ICD-10-CM | POA: Insufficient documentation

## 2019-08-07 DIAGNOSIS — M1711 Unilateral primary osteoarthritis, right knee: Secondary | ICD-10-CM | POA: Diagnosis not present

## 2019-08-07 DIAGNOSIS — M1990 Unspecified osteoarthritis, unspecified site: Secondary | ICD-10-CM | POA: Insufficient documentation

## 2019-08-07 DIAGNOSIS — F419 Anxiety disorder, unspecified: Secondary | ICD-10-CM | POA: Insufficient documentation

## 2019-08-07 DIAGNOSIS — S83241A Other tear of medial meniscus, current injury, right knee, initial encounter: Secondary | ICD-10-CM | POA: Diagnosis present

## 2019-08-07 DIAGNOSIS — Z853 Personal history of malignant neoplasm of breast: Secondary | ICD-10-CM | POA: Diagnosis not present

## 2019-08-07 DIAGNOSIS — M23321 Other meniscus derangements, posterior horn of medial meniscus, right knee: Secondary | ICD-10-CM | POA: Diagnosis not present

## 2019-08-07 DIAGNOSIS — Z79899 Other long term (current) drug therapy: Secondary | ICD-10-CM | POA: Insufficient documentation

## 2019-08-07 DIAGNOSIS — F17210 Nicotine dependence, cigarettes, uncomplicated: Secondary | ICD-10-CM | POA: Insufficient documentation

## 2019-08-07 DIAGNOSIS — F329 Major depressive disorder, single episode, unspecified: Secondary | ICD-10-CM | POA: Diagnosis not present

## 2019-08-07 DIAGNOSIS — K219 Gastro-esophageal reflux disease without esophagitis: Secondary | ICD-10-CM | POA: Insufficient documentation

## 2019-08-07 DIAGNOSIS — Z7951 Long term (current) use of inhaled steroids: Secondary | ICD-10-CM | POA: Insufficient documentation

## 2019-08-07 DIAGNOSIS — Z85828 Personal history of other malignant neoplasm of skin: Secondary | ICD-10-CM | POA: Diagnosis not present

## 2019-08-07 DIAGNOSIS — X58XXXA Exposure to other specified factors, initial encounter: Secondary | ICD-10-CM | POA: Insufficient documentation

## 2019-08-07 DIAGNOSIS — J449 Chronic obstructive pulmonary disease, unspecified: Secondary | ICD-10-CM | POA: Diagnosis not present

## 2019-08-07 DIAGNOSIS — G8929 Other chronic pain: Secondary | ICD-10-CM | POA: Insufficient documentation

## 2019-08-07 HISTORY — PX: KNEE ARTHROSCOPY WITH MEDIAL MENISECTOMY: SHX5651

## 2019-08-07 HISTORY — PX: CHONDROPLASTY: SHX5177

## 2019-08-07 SURGERY — ARTHROSCOPY, KNEE, WITH MEDIAL MENISCECTOMY
Anesthesia: General | Site: Knee | Laterality: Right

## 2019-08-07 MED ORDER — ROCURONIUM BROMIDE 100 MG/10ML IV SOLN
INTRAVENOUS | Status: DC | PRN
Start: 2019-08-07 — End: 2019-08-07
  Administered 2019-08-07: 30 mg via INTRAVENOUS

## 2019-08-07 MED ORDER — IBUPROFEN 400 MG PO TABS
400.0000 mg | ORAL_TABLET | Freq: Once | ORAL | Status: AC
  Administered 2019-08-07: 400 mg via ORAL

## 2019-08-07 MED ORDER — GLYCOPYRROLATE PF 0.2 MG/ML IJ SOSY
PREFILLED_SYRINGE | INTRAMUSCULAR | Status: AC
  Filled 2019-08-07: qty 1

## 2019-08-07 MED ORDER — EPINEPHRINE PF 1 MG/ML IJ SOLN
INTRAMUSCULAR | Status: AC
  Filled 2019-08-07: qty 2

## 2019-08-07 MED ORDER — IBUPROFEN 400 MG PO TABS
ORAL_TABLET | ORAL | Status: AC
  Filled 2019-08-07: qty 1

## 2019-08-07 MED ORDER — IPRATROPIUM-ALBUTEROL 0.5-2.5 (3) MG/3ML IN SOLN
3.0000 mL | RESPIRATORY_TRACT | Status: DC
  Administered 2019-08-07: 3 mL via RESPIRATORY_TRACT

## 2019-08-07 MED ORDER — EPHEDRINE 5 MG/ML INJ
INTRAVENOUS | Status: AC
  Filled 2019-08-07: qty 10

## 2019-08-07 MED ORDER — CEFAZOLIN SODIUM-DEXTROSE 2-4 GM/100ML-% IV SOLN
2.0000 g | INTRAVENOUS | Status: AC
  Administered 2019-08-07: 2 g via INTRAVENOUS
  Filled 2019-08-07: qty 100

## 2019-08-07 MED ORDER — LIDOCAINE HCL (CARDIAC) PF 100 MG/5ML IV SOSY
PREFILLED_SYRINGE | INTRAVENOUS | Status: DC | PRN
  Administered 2019-08-07: 100 mg via INTRAVENOUS

## 2019-08-07 MED ORDER — PHENYLEPHRINE HCL (PRESSORS) 10 MG/ML IV SOLN
INTRAVENOUS | Status: DC | PRN
Start: 2019-08-07 — End: 2019-08-07
  Administered 2019-08-07 (×9): 100 ug via INTRAVENOUS

## 2019-08-07 MED ORDER — PHENYLEPHRINE 40 MCG/ML (10ML) SYRINGE FOR IV PUSH (FOR BLOOD PRESSURE SUPPORT)
PREFILLED_SYRINGE | INTRAVENOUS | Status: AC
  Filled 2019-08-07: qty 10

## 2019-08-07 MED ORDER — FENTANYL CITRATE (PF) 100 MCG/2ML IJ SOLN
INTRAMUSCULAR | Status: AC
  Filled 2019-08-07: qty 2

## 2019-08-07 MED ORDER — ORAL CARE MOUTH RINSE: 15.0000 mL | Freq: Once | OROMUCOSAL | Status: AC

## 2019-08-07 MED ORDER — SODIUM CHLORIDE 0.9 % IR SOLN
Status: DC | PRN
  Administered 2019-08-07: 1000 mL

## 2019-08-07 MED ORDER — SUGAMMADEX SODIUM 200 MG/2ML IV SOLN
INTRAVENOUS | Status: DC | PRN
Start: 2019-08-07 — End: 2019-08-07
  Administered 2019-08-07: 200 mg via INTRAVENOUS

## 2019-08-07 MED ORDER — SODIUM CHLORIDE 0.9 % IR SOLN
Status: DC | PRN
  Administered 2019-08-07 (×2): 3000 mL

## 2019-08-07 MED ORDER — ONDANSETRON HCL 4 MG/2ML IJ SOLN
INTRAMUSCULAR | Status: AC
  Filled 2019-08-07: qty 2

## 2019-08-07 MED ORDER — HYDROCODONE-ACETAMINOPHEN 5-325 MG PO TABS
1.0000 | ORAL_TABLET | Freq: Once | ORAL | Status: AC
  Administered 2019-08-07: 1 via ORAL

## 2019-08-07 MED ORDER — SUCCINYLCHOLINE CHLORIDE 20 MG/ML IJ SOLN
INTRAMUSCULAR | Status: DC | PRN
Start: 2019-08-07 — End: 2019-08-07
  Administered 2019-08-07: 200 mg via INTRAVENOUS

## 2019-08-07 MED ORDER — ONDANSETRON HCL 4 MG/2ML IJ SOLN: 4.0000 mg | Freq: Once | INTRAMUSCULAR | Status: DC

## 2019-08-07 MED ORDER — ROCURONIUM BROMIDE 10 MG/ML (PF) SYRINGE
PREFILLED_SYRINGE | INTRAVENOUS | Status: AC
  Filled 2019-08-07: qty 10

## 2019-08-07 MED ORDER — HYDROCODONE-ACETAMINOPHEN 5-325 MG PO TABS
ORAL_TABLET | ORAL | Status: AC
  Filled 2019-08-07: qty 1

## 2019-08-07 MED ORDER — CEFAZOLIN SODIUM-DEXTROSE 2-4 GM/100ML-% IV SOLN
2.0000 g | INTRAVENOUS | Status: DC
Start: 2019-08-07 — End: 2019-08-07

## 2019-08-07 MED ORDER — CEFAZOLIN SODIUM-DEXTROSE 1-4 GM/50ML-% IV SOLN
1.0000 g | INTRAVENOUS | Status: AC
  Administered 2019-08-07: 1 g via INTRAVENOUS
  Filled 2019-08-07: qty 50

## 2019-08-07 MED ORDER — BUPIVACAINE-EPINEPHRINE (PF) 0.5% -1:200000 IJ SOLN
INTRAMUSCULAR | Status: DC | PRN
  Administered 2019-08-07: 60 mL via PERINEURAL

## 2019-08-07 MED ORDER — ESMOLOL HCL 100 MG/10ML IV SOLN
INTRAVENOUS | Status: DC | PRN
  Administered 2019-08-07: 20 mg via INTRAVENOUS

## 2019-08-07 MED ORDER — HYDROCODONE-ACETAMINOPHEN 5-325 MG PO TABS: 1.0000 | ORAL_TABLET | ORAL | 0 refills | Status: DC | PRN

## 2019-08-07 MED ORDER — MEPERIDINE HCL 50 MG/ML IJ SOLN: 6.2500 mg | INTRAMUSCULAR | Status: DC | PRN

## 2019-08-07 MED ORDER — BUPIVACAINE-EPINEPHRINE (PF) 0.5% -1:200000 IJ SOLN
INTRAMUSCULAR | Status: AC
  Filled 2019-08-07: qty 30

## 2019-08-07 MED ORDER — DEXAMETHASONE SODIUM PHOSPHATE 10 MG/ML IJ SOLN
INTRAMUSCULAR | Status: AC
  Filled 2019-08-07: qty 1

## 2019-08-07 MED ORDER — IPRATROPIUM-ALBUTEROL 0.5-2.5 (3) MG/3ML IN SOLN
RESPIRATORY_TRACT | Status: AC
  Filled 2019-08-07: qty 3

## 2019-08-07 MED ORDER — MIDAZOLAM HCL 2 MG/2ML IJ SOLN
INTRAMUSCULAR | Status: AC
  Filled 2019-08-07: qty 2

## 2019-08-07 MED ORDER — PROPOFOL 10 MG/ML IV BOLUS
INTRAVENOUS | Status: DC | PRN
  Administered 2019-08-07: 200 mg via INTRAVENOUS

## 2019-08-07 MED ORDER — EPINEPHRINE PF 1 MG/ML IJ SOLN
INTRAMUSCULAR | Status: AC
  Filled 2019-08-07: qty 3

## 2019-08-07 MED ORDER — GLYCOPYRROLATE 0.2 MG/ML IJ SOLN
INTRAMUSCULAR | Status: DC | PRN
Start: 2019-08-07 — End: 2019-08-07
  Administered 2019-08-07: .2 mg via INTRAVENOUS

## 2019-08-07 MED ORDER — PROMETHAZINE HCL 25 MG/ML IJ SOLN: 6.2500 mg | INTRAMUSCULAR | Status: DC | PRN

## 2019-08-07 MED ORDER — LACTATED RINGERS IV SOLN
Freq: Once | INTRAVENOUS | Status: AC
  Administered 2019-08-07: 1000 mL via INTRAVENOUS

## 2019-08-07 MED ORDER — ONDANSETRON HCL 4 MG/2ML IJ SOLN
INTRAMUSCULAR | Status: DC | PRN
  Administered 2019-08-07: 4 mg via INTRAVENOUS

## 2019-08-07 MED ORDER — LACTATED RINGERS IV SOLN
INTRAVENOUS | Status: DC | PRN
Start: 2019-08-07 — End: 2019-08-07

## 2019-08-07 MED ORDER — LIDOCAINE 2% (20 MG/ML) 5 ML SYRINGE
INTRAMUSCULAR | Status: AC
  Filled 2019-08-07: qty 5

## 2019-08-07 MED ORDER — DEXAMETHASONE SODIUM PHOSPHATE 4 MG/ML IJ SOLN
INTRAMUSCULAR | Status: DC | PRN
  Administered 2019-08-07: 4 mg via INTRAVENOUS

## 2019-08-07 MED ORDER — ESMOLOL HCL 100 MG/10ML IV SOLN
INTRAVENOUS | Status: AC
  Filled 2019-08-07: qty 10

## 2019-08-07 MED ORDER — FENTANYL CITRATE (PF) 100 MCG/2ML IJ SOLN: 25.0000 ug | INTRAMUSCULAR | Status: DC | PRN

## 2019-08-07 MED ORDER — SUCCINYLCHOLINE CHLORIDE 200 MG/10ML IV SOSY
PREFILLED_SYRINGE | INTRAVENOUS | Status: AC
  Filled 2019-08-07: qty 10

## 2019-08-07 MED ORDER — FENTANYL CITRATE (PF) 100 MCG/2ML IJ SOLN
INTRAMUSCULAR | Status: DC | PRN
  Administered 2019-08-07: 100 ug via INTRAVENOUS
  Administered 2019-08-07 (×2): 50 ug via INTRAVENOUS

## 2019-08-07 MED ORDER — EPHEDRINE SULFATE 50 MG/ML IJ SOLN
INTRAMUSCULAR | Status: DC | PRN
Start: 2019-08-07 — End: 2019-08-07
  Administered 2019-08-07 (×5): 5 mg via INTRAVENOUS

## 2019-08-07 MED ORDER — PROPOFOL 10 MG/ML IV BOLUS
INTRAVENOUS | Status: AC
  Filled 2019-08-07: qty 20

## 2019-08-07 MED ORDER — MIDAZOLAM HCL 5 MG/5ML IJ SOLN
INTRAMUSCULAR | Status: DC | PRN
  Administered 2019-08-07: 2 mg via INTRAVENOUS

## 2019-08-07 MED ORDER — CHLORHEXIDINE GLUCONATE 0.12 % MT SOLN
15.0000 mL | Freq: Once | OROMUCOSAL | Status: AC
  Administered 2019-08-07: 15 mL via OROMUCOSAL
  Filled 2019-08-07: qty 15

## 2019-08-07 SURGICAL SUPPLY — 51 items
ABLATOR ASPIRATE 50D MULTI-PRT (SURGICAL WAND) IMPLANT
APL PRP STRL LF DISP 70% ISPRP (MISCELLANEOUS) ×2
BAG HAMPER (MISCELLANEOUS) ×4 IMPLANT
BANDAGE ELASTIC 6 VELCRO NS (GAUZE/BANDAGES/DRESSINGS) ×4 IMPLANT
BLADE SURG SZ11 CARB STEEL (BLADE) ×4 IMPLANT
BNDG CMPR STD VLCR NS LF 5.8X6 (GAUZE/BANDAGES/DRESSINGS) ×2
BNDG ELASTIC 6X5.8 VLCR NS LF (GAUZE/BANDAGES/DRESSINGS) ×4 IMPLANT
CHLORAPREP W/TINT 26 (MISCELLANEOUS) ×4 IMPLANT
CLOTH BEACON ORANGE TIMEOUT ST (SAFETY) ×4 IMPLANT
COOLER ICEMAN CLASSIC (MISCELLANEOUS) ×4 IMPLANT
COVER WAND RF STERILE (DRAPES) ×4 IMPLANT
CUFF TOURN SGL QUICK 34 (TOURNIQUET CUFF) ×4
CUFF TRNQT CYL 34X4.125X (TOURNIQUET CUFF) ×2 IMPLANT
DECANTER SPIKE VIAL GLASS SM (MISCELLANEOUS) ×8 IMPLANT
DRAPE HALF SHEET 40X57 (DRAPES) ×4 IMPLANT
GAUZE 4X4 16PLY RFD (DISPOSABLE) ×4 IMPLANT
GAUZE SPONGE 4X4 12PLY STRL (GAUZE/BANDAGES/DRESSINGS) ×4 IMPLANT
GAUZE SPONGE 4X4 16PLY XRAY LF (GAUZE/BANDAGES/DRESSINGS) ×4 IMPLANT
GAUZE XEROFORM 5X9 LF (GAUZE/BANDAGES/DRESSINGS) ×4 IMPLANT
GLOVE BIOGEL PI IND STRL 7.0 (GLOVE) ×4 IMPLANT
GLOVE BIOGEL PI INDICATOR 7.0 (GLOVE) ×4
GLOVE SKINSENSE NS SZ8.0 LF (GLOVE) ×2
GLOVE SKINSENSE STRL SZ8.0 LF (GLOVE) ×2 IMPLANT
GLOVE SS N UNI LF 8.5 STRL (GLOVE) ×4 IMPLANT
GOWN STRL REUS W/TWL LRG LVL3 (GOWN DISPOSABLE) ×4 IMPLANT
GOWN STRL REUS W/TWL XL LVL3 (GOWN DISPOSABLE) ×4 IMPLANT
IV NS IRRIG 3000ML ARTHROMATIC (IV SOLUTION) ×8 IMPLANT
KIT BLADEGUARD II DBL (SET/KITS/TRAYS/PACK) ×4 IMPLANT
KIT TURNOVER CYSTO (KITS) ×4 IMPLANT
MANIFOLD NEPTUNE II (INSTRUMENTS) ×4 IMPLANT
MARKER SKIN DUAL TIP RULER LAB (MISCELLANEOUS) ×4 IMPLANT
NEEDLE HYPO 18GX1.5 BLUNT FILL (NEEDLE) ×4 IMPLANT
NEEDLE HYPO 21X1.5 SAFETY (NEEDLE) ×4 IMPLANT
NEEDLE SPNL 18GX3.5 QUINCKE PK (NEEDLE) ×4 IMPLANT
NS IRRIG 1000ML POUR BTL (IV SOLUTION) ×4 IMPLANT
PACK ARTHROSCOPY WL (CUSTOM PROCEDURE TRAY) ×4 IMPLANT
PAD ABD 5X9 TENDERSORB (GAUZE/BANDAGES/DRESSINGS) ×4 IMPLANT
PAD ARMBOARD 7.5X6 YLW CONV (MISCELLANEOUS) ×4 IMPLANT
PAD COLD SHLDR WRAP-ON (PAD) ×4 IMPLANT
PADDING CAST COTTON 6X4 STRL (CAST SUPPLIES) ×4 IMPLANT
PADDING WEBRIL 6 STERILE (GAUZE/BANDAGES/DRESSINGS) ×4 IMPLANT
PORT APPOLLO RF 90DEGREE MULTI (SURGICAL WAND) IMPLANT
RESECTOR TORPEDO 4MM 13CM CVD (MISCELLANEOUS) ×4 IMPLANT
SET ARTHROSCOPY INST (INSTRUMENTS) ×4 IMPLANT
SET BASIN LINEN APH (SET/KITS/TRAYS/PACK) ×4 IMPLANT
SUT ETHILON 3 0 FSL (SUTURE) ×4 IMPLANT
SYR 10ML LL (SYRINGE) ×4 IMPLANT
SYR 30ML LL (SYRINGE) ×4 IMPLANT
TUBE CONNECTING 12'X1/4 (SUCTIONS) ×2
TUBE CONNECTING 12X1/4 (SUCTIONS) ×6 IMPLANT
TUBING IN/OUT FLOW W/MAIN PUMP (TUBING) ×4 IMPLANT

## 2019-08-07 NOTE — Op Note (Signed)
08/07/2019  8:38 AM  PATIENT:  Tracy Neal  64 y.o. female  PRE-OPERATIVE DIAGNOSIS:  right knee medial meniscus tear  POST-OPERATIVE DIAGNOSIS:  right knee medial meniscus tear, arthritis right knee  PROCEDURE:  Procedure(s): KNEE ARTHROSCOPY WITH MEDIAL MENISECTOMY (Right) CHONDROPLASTY MEDIAL CONDYAL  Findings: Diffuse chondral flap tear medial femoral condyle posterior horn medial meniscus tear, laxity in the ACL, chondral flap tear patella and trochlear lesion grade 3 Synovitis throughout the joint  Details of the surgical procedure  [name] was seen in preop the right knee was confirmed as the surgical site and marked chart review was completed and she was taken to surgery.  After general anesthesia while in the supine position the right leg was placed in a arthroscopic leg holder and the left leg was placed on a pad  The right leg was prepped and draped sterilely followed by timeout  A lateral portal was established and the scope was placed into the joint for diagnostic arthroscopy.  After diagnostic arthroscopy which included complete evaluation of the joint a medial portal was established and a curved torpedo shaver was used to perform meniscectomy and chondroplasty of the medial femoral condyle and medial meniscus  A probe was then placed into the joint to confirm stable rim of posterior horn meniscus with a tear originated.  Second probing of the ACL noted laxity but no tear  Chondral flap of the patella was also removed  Remaining joint was left intact  Joint was irrigated  60 cc of Marcaine with epinephrine was injected  Portals were closed with 3-0 nylon suture  Sterile dressing and Cryo/Cuff were placed patient extubated taken recovery room stable condition  Sutures out in 1 week Immediate weightbearing immediate range of motion   SURGEON:  Surgeon(s) and Role:    * Carole Civil, MD - Primary  PHYSICIAN ASSISTANT:   ASSISTANTS: none    ANESTHESIA:   general  EBL: none  BLOOD ADMINISTERED:none  DRAINS: none   LOCAL MEDICATIONS USED:  MARCAINE     SPECIMEN:  No Specimen  DISPOSITION OF SPECIMEN:  N/A  COUNTS:  YES  TOURNIQUET:  * Missing tourniquet times found for documented tourniquets in log: [id number] *  DICTATION: .Dragon Dictation  PLAN OF CARE: Discharge to home after PACU  PATIENT DISPOSITION:  PACU - hemodynamically stable.   Delay start of Pharmacological VTE agent (>24hrs) due to surgical blood loss or risk of bleeding: na

## 2019-08-07 NOTE — H&P (Signed)
Tracy Neal      Chief Complaint  Patient presents with  . Knee Pain    R/here for surgery consult/    64 year old female with COPD over 1 year history of pain right knee unrelieved by nonoperative measures.  Patient seen by Dr. Luna Glasgow eventually had an MRI and x-rays which showed osteoarthritis of the medial and patellofemoral compartments with a torn medial meniscus  Patient complains of global knee pain catching and giving way   Review of Systems  Respiratory: Positive for shortness of breath.   Musculoskeletal: Positive for joint pain.         Past Medical History:  Diagnosis Date  . Anxiety   . Arthritis   . Basal cell carcinoma    not biopsy confirmed; Right bridge of nose  . Breast cancer ([place])   . COPD (chronic obstructive pulmonary disease) ([place])   . COPD, moderate ([place])   . Depression   . GERD (gastroesophageal reflux disease)   . Shortness of breath dyspnea   . Umbilical hernia          Past Surgical History:  Procedure Laterality Date  . BREAST LUMPECTOMY Right 07/15/2012   Procedure: BREAST LUMPECTOMY WITH AXILLARY LYMPH NODE BIOPSY;  Surgeon: Harl Bowie, MD;  Location: [facility];  Service: General;  Laterality: Right;  Nuclear medicine injection 9:30   . CHOLECYSTECTOMY    . COLONOSCOPY N/A 03/16/2015   Procedure: COLONOSCOPY;  Surgeon: Danie Binder, MD;  [facility];  Service: Endoscopy;  Laterality: N/A;  2:30 PM - moved to 12:30 - office to notify pt  . TUBAL LIGATION           Family History  Problem Relation Age of Onset  . Diabetes Mother   . Stroke Mother   . Multiple sclerosis Mother   . Cancer Paternal Aunt    Social History        Tobacco Use  . Smoking status: Current Some Day Smoker    Packs/day: 0.50    Years: 40.00    Pack years: 20.00    Types: Cigarettes  . Smokeless tobacco: Never Used  Vaping Use  . Vaping Use: Never used  Substance Use Topics  .  Alcohol use: No  . Drug use: No         Allergies  Allergen Reactions  . Percocet [Oxycodone-Acetaminophen] Itching    And nausea  . Oxycodone-Acetaminophen Nausea And Vomiting and Nausea Only    And feels like bugs crawling on skin      Active Medications      Current Meds  Medication Sig  . albuterol (PROVENTIL) (2.5 MG/3ML) 0.083% nebulizer solution USE 1 VIAL IN NEBULIZER EVERY 6 HOURS AS NEEDED FOR WHEEZING OR SHORTNESS OF BREATH.  Marland Kitchen albuterol (VENTOLIN HFA) 108 (90 Base) MCG/ACT inhaler INHALE 1-2 PUFFS EVERY 4 HOURS AS NEEDED FOR WHEEZING AND FOR SHORTNESS OF BREATH.  Marland Kitchen amitriptyline (ELAVIL) 25 MG tablet Take 1-2 tablets (25-50 mg total) by mouth at bedtime. (Needs to be seen before next refill) (Patient taking differently: Take 25-50 mg by mouth at bedtime as needed. (Needs to be seen before next refill))  . budesonide-formoterol (SYMBICORT) 160-4.5 MCG/ACT inhaler Inhale 2 puffs into the lungs 2 (two) times daily. (Needs to be seen before next refill)  . cyclobenzaprine (FLEXERIL) 10 MG tablet Take 1 tablet (10 mg total) by mouth at bedtime.  Marland Kitchen esomeprazole (NEXIUM) 40 MG capsule TAKE 1 CAPSULE BY MOUTH ONCE DAILY.  Marland Kitchen  HYDROcodone-acetaminophen (NORCO/VICODIN) 5-325 MG tablet Take 1 tablet by mouth every 4 (four) hours as needed for up to 5 days for moderate pain.  . IBU 800 MG tablet TAKE 1 TABLET EVERY 8 HOURS AS NEEDED.  Marland Kitchen INCRUSE ELLIPTA 62.5 MCG/INH AEPB INHALE 1 PUFF INTO THE LUNGS ONCE A DAY.  Marland Kitchen ipratropium (ATROVENT) 0.06 % nasal spray Place 1 spray into both nostrils daily as needed for rhinitis.  Marland Kitchen PARoxetine (PAXIL) 10 MG tablet Take 1 tablet (10 mg total) by mouth daily.  Marland Kitchen Respiratory Therapy Supplies (NEBULIZER/TUBING/MOUTHPIECE) KIT Use w/ nebulizer as needed  . [DISCONTINUED] HYDROcodone-acetaminophen (NORCO/VICODIN) 5-325 MG tablet Take 1 tablet by mouth every 4 (four) hours as needed for up to 5 days for moderate pain.      BP 138/89   Pulse  82   Ht 5' 7"  (1.702 m)   Wt 262 lb (118.8 kg)   BMI 41.04 kg/m   The patient meets the AMA guidelines for Morbid (severe) obesity with a BMI > 40.0 and I have recommended weight loss.  Physical Exam Constitutional:      General: She is not in acute distress.    Appearance: She is well-developed.  Cardiovascular:     Comments: No peripheral edema Skin:    General: Skin is warm and dry.  Neurological:     Mental Status: She is alert and oriented to person, place, and time.     Sensory: No sensory deficit.     Coordination: Coordination normal.     Gait: Gait normal.     Deep Tendon Reflexes: Reflexes are normal and symmetric.     Ortho Exam Right knee  Tenderness medial compartment and lateral compartment no joint effusion detected patient's range of motion is 5 to 125 degrees with stable ligaments muscle tone is normal  Skin is intact  Patient complains of radiating right hip and leg pain as well  MEDICAL DECISION SECTION  xrays ordered?  No  My independent reading of xrays: Yes  Plain films show x-rays of the knee with osteoarthritis  MRI of the knee shows a torn medial meniscus with global arthritis         Encounter Diagnoses  Name Primary?  . Chronic pain of right knee Yes  . Nicotine dependence, cigarettes, uncomplicated   . Body mass index 40.0-44.9, adult ([place])   . Morbid obesity ([place])   . Derangement of posterior horn of medial meniscus of right knee      PLAN:    Surgical procedure planned: Arthroscopy right knee and partial medial meniscectomy  The procedure has been fully reviewed with the patient; The risks and benefits of surgery have been discussed and explained and understood. Alternative treatment has also been reviewed, questions were encouraged and answered. The postoperative plan is also been reviewed.  Nonsurgical treatment as described in the history and physical section was attempted and unsuccessful and  the patient has agreed to proceed with surgical intervention to improve their situation.      Meds ordered this encounter  Medications  . HYDROcodone-acetaminophen (NORCO/VICODIN) 5-325 MG tablet    Sig: Take 1 tablet by mouth every 4 (four) hours as needed for up to 5 days for moderate pain.    Dispense:  30 tablet    Refill:  0    Arther Abbott, MD 07/22/2019 4:07 PM

## 2019-08-07 NOTE — Interval H&P Note (Signed)
History and Physical Interval Note:  08/07/2019 7:30 AM  Tracy Neal  has presented today for surgery, with the diagnosis of right knee medial meniscus tear.  The various methods of treatment have been discussed with the patient and family. After consideration of risks, benefits and other options for treatment, the patient has consented to  Procedure(s): KNEE ARTHROSCOPY WITH MEDIAL MENISECTOMY (Right) as a surgical intervention.  The patient's history has been reviewed, patient examined, no change in status, stable for surgery.  I have reviewed the patient's chart and labs.  Questions were answered to the patient's satisfaction.     Arther Abbott

## 2019-08-07 NOTE — Anesthesia Preprocedure Evaluation (Signed)
Anesthesia Evaluation  Patient identified by MRN, date of birth, ID band Patient awake    Reviewed: Allergy & Precautions, NPO status , Patient's Chart, lab work & pertinent test results  History of Anesthesia Complications Negative for: history of anesthetic complications  Airway Mallampati: II  TM Distance: >3 FB Neck ROM: Full    Dental  (+) Edentulous Upper, Missing, Dental Advisory Given   Pulmonary shortness of breath, COPD,  COPD inhaler, Current Smoker and Patient abstained from smoking.,    Pulmonary exam normal breath sounds clear to auscultation       Cardiovascular Normal cardiovascular exam Rhythm:Regular Rate:Normal  30-Jul-2017 08:18:25 [facility] ROUTINE RECORD Normal sinus rhythm Nonspecific T wave abnormality Prolonged QT Abnormal ECG Confirmed by Asencion Noble [phone]) on 08/04/2017 9:39:58 AM   Neuro/Psych PSYCHIATRIC DISORDERS Anxiety Depression    GI/Hepatic GERD  Medicated and Controlled,  Endo/Other  Morbid obesity  Renal/GU      Musculoskeletal  (+) Arthritis , Osteoarthritis,    Abdominal   Peds  Hematology   Anesthesia Other Findings Breast cancer Chronic pain syndrome  Reproductive/Obstetrics                             Anesthesia Physical Anesthesia Plan  ASA: III  Anesthesia Plan: General   Post-op Pain Management:    Induction: Intravenous  PONV Risk Score and Plan: 3 and Ondansetron, Dexamethasone, Midazolam and Metaclopromide  Airway Management Planned: Oral ETT  Additional Equipment:   Intra-op Plan:   Post-operative Plan: Extubation in OR  Informed Consent: I have reviewed the patients History and Physical, chart, labs and discussed the procedure including the risks, benefits and alternatives for the proposed anesthesia with the patient or authorized representative who has indicated his/her understanding and acceptance.      Dental advisory given  Plan Discussed with: CRNA and Surgeon  Anesthesia Plan Comments:         Anesthesia Quick Evaluation

## 2019-08-07 NOTE — Anesthesia Procedure Notes (Signed)
Procedure Name: Intubation Performed by: Tacy Learn, CRNA Pre-anesthesia Checklist: Patient identified, Emergency Drugs available, Suction available, Patient being monitored and Timeout performed Patient Re-evaluated:Patient Re-evaluated prior to induction Oxygen Delivery Method: Circle system utilized Preoxygenation: Pre-oxygenation with 100% oxygen Induction Type: IV induction Laryngoscope Size: Miller and 2 Grade View: Grade I Tube size: 7.0 mm Number of attempts: 1 Airway Equipment and Method: Stylet Placement Confirmation: ETT inserted through vocal cords under direct vision,  positive ETCO2,  CO2 detector and breath sounds checked- equal and bilateral Secured at: 21 cm Tube secured with: Tape Dental Injury: Teeth and Oropharynx as per pre-operative assessment

## 2019-08-07 NOTE — Brief Op Note (Signed)
08/07/2019  8:38 AM  PATIENT:  Tracy Neal  64 y.o. female  PRE-OPERATIVE DIAGNOSIS:  right knee medial meniscus tear  POST-OPERATIVE DIAGNOSIS:  right knee medial meniscus tear, arthritis right knee  PROCEDURE:  Procedure(s): KNEE ARTHROSCOPY WITH MEDIAL MENISECTOMY (Right) CHONDROPLASTY MEDIAL CONDYAL  Findings: Diffuse chondral flap tear medial femoral condyle posterior horn medial meniscus tear, laxity in the ACL, chondral flap tear patella and trochlear lesion grade 3 Synovitis throughout the joint  Details of the surgical procedure  Tracy Neal was seen in preop the right knee was confirmed as the surgical site and marked chart review was completed and she was taken to surgery.  After general anesthesia while in the supine position the right leg was placed in a arthroscopic leg holder and the left leg was placed on a pad  The right leg was prepped and draped sterilely followed by timeout  A lateral portal was established and the scope was placed into the joint for diagnostic arthroscopy.  After diagnostic arthroscopy which included complete evaluation of the joint a medial portal was established and a curved torpedo shaver was used to perform meniscectomy and chondroplasty of the medial femoral condyle and medial meniscus  A probe was then placed into the joint to confirm stable rim of posterior horn meniscus with a tear originated.  Second probing of the ACL noted laxity but no tear  Chondral flap of the patella was also removed  Remaining joint was left intact  Joint was irrigated  60 cc of Marcaine with epinephrine was injected  Portals were closed with 3-0 nylon suture  Sterile dressing and Cryo/Cuff were placed patient extubated taken recovery room stable condition  Sutures out in 1 week Immediate weightbearing immediate range of motion   SURGEON:  Surgeon(s) and Role:    * Carole Civil, MD - Primary  PHYSICIAN ASSISTANT:   ASSISTANTS: none    ANESTHESIA:   general  EBL: none  BLOOD ADMINISTERED:none  DRAINS: none   LOCAL MEDICATIONS USED:  MARCAINE     SPECIMEN:  No Specimen  DISPOSITION OF SPECIMEN:  N/A  COUNTS:  YES  TOURNIQUET:  * Missing tourniquet times found for documented tourniquets in log: [id number] *  DICTATION: .Dragon Dictation  PLAN OF CARE: Discharge to home after PACU  PATIENT DISPOSITION:  PACU - hemodynamically stable.   Delay start of Pharmacological VTE agent (>24hrs) due to surgical blood loss or risk of bleeding: na

## 2019-08-07 NOTE — Discharge Instructions (Signed)
PATIENT INSTRUCTIONS POST-ANESTHESIA  IMMEDIATELY FOLLOWING SURGERY:  Do not drive or operate machinery for the first twenty four hours after surgery.  Do not make any important decisions for twenty four hours after surgery or while taking narcotic pain medications or sedatives.  If you develop intractable nausea and vomiting or a severe headache please notify your doctor immediately.  FOLLOW-UP:  Please make an appointment with your surgeon as instructed. You do not need to follow up with anesthesia unless specifically instructed to do so.  WOUND CARE INSTRUCTIONS (if applicable):  Keep a dry clean dressing on the anesthesia/puncture wound site if there is drainage.  Once the wound has quit draining you may leave it open to air.  Generally you should leave the bandage intact for twenty four hours unless there is drainage.  If the epidural site drains for more than 36-48 hours please call the anesthesia department.  QUESTIONS?:  Please feel free to call your physician or the hospital operator if you have any questions, and they will be happy to assist you.      Knee Arthroscopy, Care After This sheet gives you information about how to care for yourself after your procedure. Your health care provider may also give you more specific instructions. If you have problems or questions, contact your health care provider. What can I expect after the procedure? After the procedure, it is common to have:  Soreness.  Swelling.  Pain that can be relieved by taking pain medicine. Follow these instructions at home: Incision care   Follow instructions from your health care provider about how to take care of your incisions. Make sure you: ? Wash your hands with soap and water before you change your bandage (dressing). If soap and water are not available, use hand sanitizer. ? Change your dressing as told by your health care provider. ? Leave stitches (sutures), staples, skin glue, or adhesive strips in  place. These skin closures may need to stay in place for 2 weeks or longer. If adhesive strip edges start to loosen and curl up, you may trim the loose edges. Do not remove adhesive strips completely unless your health care provider tells you to do that.  Check your incision areas every day for signs of infection. Check for: ? Redness. ? More swelling or pain. ? Fluid or blood. ? Warmth. ? Pus or a bad smell. Bathing  Do not take baths, swim, or use a hot tub until your health care provider approves. Ask your health care provider if you may take showers. You may only be allowed to take sponge baths. Activity  Do not use your knee to support your body weight until your health care provider says that you can. Follow weight-bearing restrictions as told. Use crutches or other devices to help you move around (assistive devices) as directed.  Ask your health care provider what activities are safe for you during recovery, and what activities you need to avoid.  If physical therapy was prescribed, do exercises as directed. Doing exercises may help improve knee movement and flexibility (range of motion).  Do not lift anything that is heavier than 10 lb (4.5 kg), or the limit that you are told, until your health care provider says that it is safe. Driving  Do not drive until your health care provider approves. You may be able to drive after 1-3 weeks.  Do not drive or use heavy machinery while taking prescription pain medicine. Managing pain, stiffness, and swelling   If directed,  put ice on the injured area: ? Put ice in a plastic bag or use the icing device (cold therapy unit) that you were given. Follow instructions from your health care provider about how to use the icing device. ? Place a towel between your skin and the bag or between your skin and the icing device. ? Leave the ice on for 20 minutes, 2-3 times a day.  Move your toes often to avoid stiffness and to lessen  swelling.  Raise (elevate) the injured area above the level of your heart while you are sitting or lying down. If you are taking blood thinners:  Before you take any medicines that contain aspirin or NSAIDs, talk with your health care provider. These medicines increase your risk for dangerous bleeding.  Take your medicine exactly as told, at the same time every day.  Avoid activities that could cause injury or bruising, and follow instructions about how to prevent falls.  Wear a medical alert bracelet or carry a card that lists what medicines you take. General instructions  Take over-the-counter and prescription medicines only as told by your health care provider.  If you are taking prescription pain medicine, take actions to prevent or treat constipation. Your health care provider may recommend that you: ? Drink enough fluid to keep your urine pale yellow. ? Eat foods that are high in fiber, such as fresh fruits and vegetables, whole grains, and beans. ? Limit foods that are high in fat and processed sugars, such as fried or sweet foods. ? Take an over-the-counter or prescription medicines for constipation.  Do not use any products that contain nicotine or tobacco, such as cigarettes and e-cigarettes. These can delay incision or bone healing. If you need help quitting, ask your health care provider.  Wear compression stockings as told by your health care provider. These stockings help to prevent blood clots and reduce swelling in your legs.  Keep all follow-up visits as told by your health care provider. This is important. Contact a health care provider if you:  Have a fever.  Have severe pain.  Have redness around an incision.  Have more swelling.  Have fluid or blood coming from an incision.  Notice that an incision feels warm to the touch.  Notice pus or a bad smell coming from an incision.  Notice that an incision opens up.  Develop a rash. Get help right away if  you:  Have difficulty breathing.  Have shortness of breath.  Have chest pain.  Develop pain in your lower leg or at the back of your knee.  Have numbness or tingling in your lower leg or your foot. Summary  Raise (elevate) the injured area above the level of your heart while you are sitting or lying down.  To help relieve pain and swelling, put ice on your leg for 20 minutes at a time, 2-3 times a day.  If you were prescribed a blood thinner, avoid activities that could cause injury or bruising, and follow instructions about how to prevent falls.  If physical therapy was prescribed, do exercises as directed. Doing exercises may help improve range of motion. This information is not intended to replace advice given to you by your health care provider. Make sure you discuss any questions you have with your health care provider. Document Revised: 12/15/2016 Document Reviewed: 11/15/2016 Elsevier Patient Education  2020 [employer].

## 2019-08-07 NOTE — Anesthesia Postprocedure Evaluation (Signed)
Anesthesia Post Note  Patient: Tracy Neal  Procedure(s) Performed: KNEE ARTHROSCOPY WITH MEDIAL MENISECTOMY (Right Knee) CHONDROPLASTY MEDIAL CONDYAL (Knee)  Anesthesia Type: General Level of consciousness: awake, oriented, awake and alert and patient cooperative Pain management: pain level controlled Vital Signs Assessment: post-procedure vital signs reviewed and stable Respiratory status: spontaneous breathing, respiratory function stable, nonlabored ventilation and patient connected to nasal cannula oxygen Cardiovascular status: blood pressure returned to baseline and stable Postop Assessment: no headache and no backache Anesthetic complications: no   No complications documented.   Last Vitals:  Vitals:   08/07/19 0830 08/07/19 0840  BP:  (!) (P) 150/71  Pulse:  84  Resp:  (!) 26  Temp: 36.4 C (P) 36.4 C  SpO2:      Last Pain:  Vitals:   08/07/19 0650  TempSrc: Oral  PainSc: 0-No pain                 Tacy Learn

## 2019-08-07 NOTE — Transfer of Care (Signed)
Immediate Anesthesia Transfer of Care Note  Patient: [name]  Procedure(s) Performed: KNEE ARTHROSCOPY WITH MEDIAL MENISECTOMY (Right Knee) CHONDROPLASTY MEDIAL CONDYAL (Knee)  Patient Location: PACU  Anesthesia Type:General  Level of Consciousness: awake, alert , oriented and patient cooperative  Airway & Oxygen Therapy: Patient Spontanous Breathing and Patient connected to nasal cannula oxygen  Post-op Assessment: Report given to RN, Post -op Vital signs reviewed and stable and Patient moving all extremities  Post vital signs: Reviewed and stable  Last Vitals:  Vitals Value Taken Time  BP 150/71 08/07/19 0839  Temp    Pulse 86 08/07/19 0841  Resp 22 08/07/19 0841  SpO2 97 % 08/07/19 0841  Vitals shown include unvalidated device data.  Last Pain:  Vitals:   08/07/19 0650  TempSrc: Oral  PainSc: 0-No pain      Patients Stated Pain Goal: 7 (20/91/98 0221)  Complications: No complications documented.

## 2019-08-08 ENCOUNTER — Other Ambulatory Visit: Payer: Self-pay

## 2019-08-08 ENCOUNTER — Emergency Department (HOSPITAL_COMMUNITY): Payer: Medicare HMO

## 2019-08-08 ENCOUNTER — Encounter (HOSPITAL_COMMUNITY): Payer: Self-pay | Admitting: Orthopedic Surgery

## 2019-08-08 ENCOUNTER — Emergency Department (HOSPITAL_COMMUNITY)
Admission: EM | Admit: 2019-08-08 | Discharge: 2019-08-08 | Disposition: A | Discharge status: Home / Self Care | Payer: Medicare HMO | Attending: Emergency Medicine | Admitting: Emergency Medicine

## 2019-08-08 DIAGNOSIS — Z7951 Long term (current) use of inhaled steroids: Secondary | ICD-10-CM | POA: Diagnosis not present

## 2019-08-08 DIAGNOSIS — Z853 Personal history of malignant neoplasm of breast: Secondary | ICD-10-CM | POA: Insufficient documentation

## 2019-08-08 DIAGNOSIS — Z85828 Personal history of other malignant neoplasm of skin: Secondary | ICD-10-CM | POA: Diagnosis not present

## 2019-08-08 DIAGNOSIS — R0602 Shortness of breath: Secondary | ICD-10-CM | POA: Insufficient documentation

## 2019-08-08 DIAGNOSIS — J449 Chronic obstructive pulmonary disease, unspecified: Secondary | ICD-10-CM | POA: Diagnosis not present

## 2019-08-08 DIAGNOSIS — R918 Other nonspecific abnormal finding of lung field: Secondary | ICD-10-CM | POA: Diagnosis not present

## 2019-08-08 DIAGNOSIS — Z20822 Contact with and (suspected) exposure to covid-19: Secondary | ICD-10-CM | POA: Diagnosis not present

## 2019-08-08 DIAGNOSIS — F17210 Nicotine dependence, cigarettes, uncomplicated: Secondary | ICD-10-CM | POA: Diagnosis not present

## 2019-08-08 LAB — CBC WITH DIFFERENTIAL/PLATELET
Abs Immature Granulocytes: 0.1 10*3/uL — ABNORMAL HIGH (ref 0.00–0.07)
Basophils Absolute: 0 10*3/uL (ref 0.0–0.1)
Basophils Relative: 0 %
Eosinophils Absolute: 0 10*3/uL (ref 0.0–0.5)
Eosinophils Relative: 0 %
HCT: 45.2 % (ref 36.0–46.0)
Hemoglobin: 14.2 g/dL (ref 12.0–15.0)
Immature Granulocytes: 1 %
Lymphocytes Relative: 19 %
Lymphs Abs: 3.3 10*3/uL (ref 0.7–4.0)
MCH: 28 pg (ref 26.0–34.0)
MCHC: 31.4 g/dL (ref 30.0–36.0)
MCV: 89.2 fL (ref 80.0–100.0)
Monocytes Absolute: 1.5 10*3/uL — ABNORMAL HIGH (ref 0.1–1.0)
Monocytes Relative: 8 %
Neutro Abs: 12.7 10*3/uL — ABNORMAL HIGH (ref 1.7–7.7)
Neutrophils Relative %: 72 %
Platelets: 316 10*3/uL (ref 150–400)
RBC: 5.07 MIL/uL (ref 3.87–5.11)
RDW: 15.4 % (ref 11.5–15.5)
WBC: 17.5 10*3/uL — ABNORMAL HIGH (ref 4.0–10.5)
nRBC: 0 % (ref 0.0–0.2)

## 2019-08-08 LAB — COMPREHENSIVE METABOLIC PANEL
ALT: 24 U/L (ref 0–44)
AST: 36 U/L (ref 15–41)
Albumin: 3.8 g/dL (ref 3.5–5.0)
Alkaline Phosphatase: 117 U/L (ref 38–126)
Anion gap: 10 (ref 5–15)
BUN: 20 mg/dL (ref 8–23)
CO2: 26 mmol/L (ref 22–32)
Calcium: 9.4 mg/dL (ref 8.9–10.3)
Chloride: 103 mmol/L (ref 98–111)
Creatinine, Ser: 0.86 mg/dL (ref 0.44–1.00)
GFR calc Af Amer: >60 mL/min (ref >60)
GFR calc non Af Amer: >60 mL/min (ref >60)
Glucose, Bld: 100 mg/dL — ABNORMAL HIGH (ref 70–99)
Potassium: 3.8 mmol/L (ref 3.5–5.1)
Sodium: 139 mmol/L (ref 135–145)
Total Bilirubin: 0.5 mg/dL (ref 0.3–1.2)
Total Protein: 7.2 g/dL (ref 6.5–8.1)

## 2019-08-08 LAB — TROPONIN I (HIGH SENSITIVITY)
Troponin I (High Sensitivity): 20 ng/L — ABNORMAL HIGH (ref <18)
Troponin I (High Sensitivity): 19 ng/L — ABNORMAL HIGH (ref <18)

## 2019-08-08 LAB — SARS CORONAVIRUS 2 BY RT PCR (HOSPITAL ORDER, PERFORMED IN ~~LOC~~ HOSPITAL LAB): SARS Coronavirus 2: NEGATIVE

## 2019-08-08 MED ORDER — FENTANYL CITRATE (PF) 100 MCG/2ML IJ SOLN
50.0000 ug | Freq: Once | INTRAMUSCULAR | Status: DC
  Filled 2019-08-08: qty 2

## 2019-08-08 MED ORDER — IPRATROPIUM-ALBUTEROL 20-100 MCG/ACT IN AERS
1.0000 | INHALATION_SPRAY | Freq: Once | RESPIRATORY_TRACT | Status: AC
  Administered 2019-08-08: 1 via RESPIRATORY_TRACT

## 2019-08-08 MED ORDER — HYDROMORPHONE HCL 1 MG/ML IJ SOLN
1.0000 mg | Freq: Once | INTRAMUSCULAR | Status: DC
  Filled 2019-08-08: qty 1

## 2019-08-08 MED ORDER — MORPHINE SULFATE (PF) 2 MG/ML IV SOLN
2.0000 mg | Freq: Once | INTRAVENOUS | Status: AC
  Administered 2019-08-08: 2 mg via INTRAMUSCULAR
  Filled 2019-08-08: qty 1

## 2019-08-08 MED ORDER — IOHEXOL 350 MG/ML SOLN
80.0000 mL | Freq: Once | INTRAVENOUS | Status: AC | PRN
  Administered 2019-08-08: 80 mL via INTRAVENOUS

## 2019-08-08 MED ORDER — FENTANYL CITRATE (PF) 100 MCG/2ML IJ SOLN
50.0000 ug | Freq: Once | INTRAMUSCULAR | Status: AC
  Administered 2019-08-08: 50 ug via INTRAVENOUS
  Filled 2019-08-08: qty 2

## 2019-08-08 MED ORDER — IPRATROPIUM-ALBUTEROL 20-100 MCG/ACT IN AERS
2.0000 | INHALATION_SPRAY | Freq: Once | RESPIRATORY_TRACT | Status: AC
  Administered 2019-08-08: 2 via RESPIRATORY_TRACT
  Filled 2019-08-08: qty 4

## 2019-08-08 MED ORDER — IBUPROFEN 800 MG PO TABS: 800.0000 mg | ORAL_TABLET | Freq: Once | ORAL | Status: DC

## 2019-08-08 NOTE — Discharge Instructions (Addendum)
Unfortunately your CT scan did show metastatic disease today, this could be recurrent from her breast cancer.  You do need to follow-up with the oncologist that I referred you to for further evaluation.  Call their office and schedule appointment.  If you have any new or worsening concerning symptoms such as shortness of breath that is worsening or chest pain please come back to the emergency department. Take Tylenol as prescribed on the bottle for pain.

## 2019-08-08 NOTE — ED Provider Notes (Signed)
[facility] - Inpatient EMERGENCY DEPARTMENT Provider Note   CSN: [id number] Arrival date & time: 08/08/19  0941     History Chief Complaint  Patient presents with  . Shortness of Breath    Tracy Neal is a 64 y.o. female with pertinent past medical history of COPD, GERD, tobacco use, osteoarthritis that presents to the emergency department today for shortness of breath, arthralgias and myalgias.  Patient states that she had knee arthroscopy yesterday, she said that it went well, lasted about an hour.  States that when she went home she woke up in the middle night feeling short of breath every time she coughed.  States that this only occurs when she coughs, has COPD and normally gets like this when she does have COPD.  Is not related to exertion.  She also has myalgias in her chest when she coughs, only has chest pain when she coughs.  Nonproductive cough.  No fevers, chills, other URI-like symptoms.  No sore throat, eye pain, ear pain, congestion, headache.  Chest pain does not radiate anywhere, no exertional component.  Does not have cardiac history.  Patient states that she also has arthralgias in her legs and arms, states that she feels stiff.  States that she did try one nebulizer treatment last night which did not provide much relief.  Denies any nausea, vomiting, abdominal pain.  States that she does have some arthralgias in her back.  Denies any urinary symptoms.  Denies any rashes, tick bites.  States that she was in normal health before her procedure.  States that she has not gotten her Covid vaccines.  Denies any sick contacts.  Does have history of breast cancer 6 years ago, states that she had lumpectomy with radiation.  HPI     Past Medical History:  Diagnosis Date  . Anxiety   . Arthritis   . Basal cell carcinoma    not biopsy confirmed; Right bridge of nose  . Breast cancer ([place])   . COPD (chronic obstructive pulmonary disease) ([place])   . COPD, moderate ([place])   . Depression     . GERD (gastroesophageal reflux disease)   . Shortness of breath dyspnea   . Umbilical hernia     Patient Active Problem List   Diagnosis Date Noted  . Derangement of posterior horn of medial meniscus of right knee   . Morbid obesity ([place]) 06/09/2016  . GERD (gastroesophageal reflux disease) 06/10/2015  . Anxiety and depression 06/10/2015  . Osteoporosis 05/13/2015  . Osteoarthritis of right knee 05/13/2015  . Osteoarthritis of left knee 05/13/2015  . Degenerative disc disease, lumbar 05/13/2015  . Special screening for malignant neoplasms, colon   . Cocaine abuse ([place]) 12/22/2014  . Chronic pain syndrome 10/22/2014  . Lumbar facet arthropathy 10/22/2014  . Osteopenia 10/22/2014  . Tobacco abuse 10/22/2014  . Leucocytosis 12/31/2012  . Breast cancer ([place]) 12/20/2012  . COPD (chronic obstructive pulmonary disease) ([place]) 08/14/2012  . Cancer of central portion of female breast ([place]) 06/19/2012  . Breast lump on right side at 12 o'clock position 06/11/2012    Past Surgical History:  Procedure Laterality Date  . BREAST LUMPECTOMY Right 07/15/2012   Procedure: BREAST LUMPECTOMY WITH AXILLARY LYMPH NODE BIOPSY;  Surgeon: Harl Bowie, MD;  Location: [place];  Service: General;  Laterality: Right;  Nuclear medicine injection 9:30   . CHOLECYSTECTOMY    . CHONDROPLASTY  08/07/2019   Procedure: CHONDROPLASTY MEDIAL CONDYAL;  Surgeon: Carole Civil, MD;  Location: [facility];  Service: Orthopedics;;  . COLONOSCOPY N/A 03/16/2015   Procedure: COLONOSCOPY;  Surgeon: Danie Binder, MD;  Location[facility];  Service: Endoscopy;  Laterality: N/A;  2:30 PM - moved to 12:30 - office to notify pt  . KNEE ARTHROSCOPY WITH MEDIAL MENISECTOMY Right 08/07/2019   Procedure: KNEE ARTHROSCOPY WITH MEDIAL MENISECTOMY;  Surgeon: Carole Civil, MD;  Location: [facility];  Service: Orthopedics;  Laterality: Right;  . TUBAL LIGATION       OB History    Gravida  2   Para  2   Term  2    Preterm      AB      Living  2     SAB      TAB      Ectopic      Multiple      Live Births              Family History  Problem Relation Age of Onset  . Diabetes Mother   . Stroke Mother   . Multiple sclerosis Mother   . Cancer Paternal Aunt     Social History   Tobacco Use  . Smoking status: Current Some Day Smoker    Packs/day: 0.50    Years: 40.00    Pack years: 20.00    Types: Cigarettes  . Smokeless tobacco: Never Used  Vaping Use  . Vaping Use: Never used  Substance Use Topics  . Alcohol use: No  . Drug use: No    Home Medications Prior to Admission medications   Medication Sig Start Date End Date Taking? Authorizing Provider  albuterol (PROVENTIL) (2.5 MG/3ML) 0.083% nebulizer solution USE 1 VIAL IN NEBULIZER EVERY 6 HOURS AS NEEDED FOR WHEEZING OR SHORTNESS OF BREATH. (Needs to be seen before next refill) Patient taking differently: Take 2.5 mg by nebulization every 6 (six) hours as needed for wheezing or shortness of breath.  08/04/19  Yes [name], Fransisca Kaufmann, MD  albuterol (VENTOLIN HFA) 108 (90 Base) MCG/ACT inhaler INHALE 1-2 PUFFS EVERY 4 HOURS AS NEEDED FOR WHEEZING AND FOR SHORTNESS OF BREATH. Patient taking differently: Inhale 1-2 puffs into the lungs every 4 (four) hours as needed for wheezing or shortness of breath.  06/09/19  Yes [name], Fransisca Kaufmann, MD  amitriptyline (ELAVIL) 25 MG tablet Take 1-2 tablets (25-50 mg total) by mouth at bedtime. (Needs to be seen before next refill) Patient taking differently: Take 25-50 mg by mouth at bedtime as needed for sleep.  04/22/19  Yes [name], Fransisca Kaufmann, MD  budesonide-formoterol (SYMBICORT) 160-4.5 MCG/ACT inhaler Inhale 2 puffs into the lungs 2 (two) times daily. (Needs to be seen before next refill) Patient taking differently: Inhale 2 puffs into the lungs 2 (two) times daily.  06/09/19  Yes [name], Fransisca Kaufmann, MD  cyclobenzaprine (FLEXERIL) 10 MG tablet Take 1 tablet (10 mg total) by mouth at  bedtime. 03/27/19  Yes [name], Fransisca Kaufmann, MD  esomeprazole (NEXIUM) 40 MG capsule TAKE 1 CAPSULE BY MOUTH ONCE DAILY. Patient taking differently: Take 40 mg by mouth daily.  07/16/19  Yes [name], Fransisca Kaufmann, MD  famotidine-calcium carbonate-magnesium hydroxide (PEPCID COMPLETE) 10-800-165 MG chewable tablet Chew 2 tablets by mouth daily as needed (heartburn).   Yes [provider]  HYDROcodone-acetaminophen (NORCO/VICODIN) 5-325 MG tablet Take 1 tablet by mouth every 4 (four) hours as needed for up to 5 days for moderate pain or severe pain. 08/07/19 08/12/19 Yes Carole Civil, MD  IBU 800 MG tablet TAKE  1 TABLET EVERY 8 HOURS AS NEEDED. Patient taking differently: Take 800 mg by mouth every 8 (eight) hours as needed for moderate pain.  06/11/19  Yes [name], Fransisca Kaufmann, MD  INCRUSE ELLIPTA 62.5 MCG/INH AEPB INHALE 1 PUFF INTO THE LUNGS ONCE A DAY. Patient taking differently: Inhale 1 puff into the lungs daily.  07/16/19  Yes [name], Fransisca Kaufmann, MD  PARoxetine (PAXIL) 10 MG tablet Take 1 tablet (10 mg total) by mouth daily. 05/26/19  Yes [name], Fransisca Kaufmann, MD  Cyanocobalamin (B-12 PO) Take 1 tablet by mouth 2 (two) times a week. Patient not taking: Reported on 08/08/2019    [provider]  nicotine (NICODERM CQ - DOSED IN MG/24 HOURS) 21 mg/24hr patch Place 1 patch (21 mg total) onto the skin daily. 07/31/19   [name], Fransisca Kaufmann, MD    Allergies    Percocet [oxycodone-acetaminophen]  Review of Systems   Review of Systems  Constitutional: Negative for chills, diaphoresis, fatigue and fever.  HENT: Negative for congestion, ear discharge, ear pain, facial swelling, sinus pressure, sinus pain, sneezing, sore throat and trouble swallowing.   Eyes: Negative for pain and visual disturbance.  Respiratory: Positive for cough and shortness of breath. Negative for apnea, chest tightness, wheezing and stridor.   Cardiovascular: Negative for chest pain, palpitations and leg  swelling.  Gastrointestinal: Negative for abdominal distention, abdominal pain, diarrhea, nausea and vomiting.  Genitourinary: Negative for difficulty urinating.  Musculoskeletal: Positive for arthralgias and myalgias. Negative for back pain, neck pain and neck stiffness.  Skin: Negative for pallor.  Neurological: Negative for dizziness, speech difficulty, weakness and headaches.  Psychiatric/Behavioral: Negative for confusion.    Physical Exam Updated Vital Signs BP (!) 154/65   Pulse 73   Temp 98.6 F (37 C)   Resp 19   Ht 5\' 7"  (1.702 m)   Wt (!) 120.2 kg   SpO2 96%   BMI 41.50 kg/m   Physical Exam Constitutional:      General: She is not in acute distress.    Appearance: Normal appearance. She is not ill-appearing, toxic-appearing or diaphoretic.  HENT:     Head: Normocephalic and atraumatic.     Mouth/Throat:     Mouth: Mucous membranes are moist.     Pharynx: Oropharynx is clear. Uvula midline. No pharyngeal swelling, oropharyngeal exudate or uvula swelling.     Tonsils: No tonsillar exudate.  Eyes:     General: No scleral icterus.    Extraocular Movements: Extraocular movements intact.     Pupils: Pupils are equal, round, and reactive to light.  Cardiovascular:     Rate and Rhythm: Normal rate and regular rhythm.     Pulses: Normal pulses.     Heart sounds: Normal heart sounds.  Pulmonary:     Effort: Pulmonary effort is normal. No respiratory distress.     Breath sounds: Normal breath sounds. No stridor. No wheezing, rhonchi or rales.  Chest:     Chest wall: No tenderness.  Abdominal:     General: Abdomen is flat. There is no distension.     Palpations: Abdomen is soft.     Tenderness: There is no abdominal tenderness. There is no guarding or rebound.  Musculoskeletal:        General: No swelling or tenderness. Normal range of motion.     Cervical back: Normal range of motion and neck supple. No rigidity or tenderness.     Right lower leg: No edema.      Left lower  leg: No edema.     Comments: Able to range all extremities, left knee is wrapped from procedure yesterday.  No overlying signs of infection on any joints in upper and lower extremities.  No midline tenderness on cervical, thoracic, lumbar spine.  Normal strength throughout.  Normal sensation throughout.  Skin:    General: Skin is warm and dry.     Capillary Refill: Capillary refill takes less than 2 seconds.     Coloration: Skin is not pale.  Neurological:     General: No focal deficit present.     Mental Status: She is alert and oriented to person, place, and time.     Cranial Nerves: No cranial nerve deficit.     Sensory: No sensory deficit.     Motor: No weakness.     Coordination: Coordination normal.  Psychiatric:        Mood and Affect: Mood normal.        Behavior: Behavior normal.     ED Results / Procedures / Treatments   Labs (all labs ordered are listed, but only abnormal results are displayed) Labs Reviewed  COMPREHENSIVE METABOLIC PANEL - Abnormal; Notable for the following components:      Result Value   Glucose, Bld 100 (*)    All other components within normal limits  CBC WITH DIFFERENTIAL/PLATELET - Abnormal; Notable for the following components:   WBC 17.5 (*)    Neutro Abs 12.7 (*)    Monocytes Absolute 1.5 (*)    Abs Immature Granulocytes 0.10 (*)    All other components within normal limits  TROPONIN I (HIGH SENSITIVITY) - Abnormal; Notable for the following components:   Troponin I (High Sensitivity) 20 (*)    All other components within normal limits  TROPONIN I (HIGH SENSITIVITY) - Abnormal; Notable for the following components:   Troponin I (High Sensitivity) 19 (*)    All other components within normal limits  SARS CORONAVIRUS 2 BY RT PCR (HOSPITAL ORDER, [place] [facility])    EKG None  Radiology CT Chest Wo Contrast  Result Date: 08/08/2019 CLINICAL DATA:  Abdominal pain and back pain. EXAM: CT CHEST WITHOUT  CONTRAST TECHNIQUE: Multidetector CT imaging of the chest was performed following the standard protocol without IV contrast. COMPARISON:  None. FINDINGS: Cardiovascular: No significant vascular findings. Normal heart size. No pericardial effusion. Mediastinum/Nodes: There is a 3.2 cm x 2.8 cm pretracheal lymph node. Thyroid gland, trachea, and esophagus demonstrate no significant findings. Lungs/Pleura: A 2.9 cm x 2.8 cm lobulated noncalcified low-attenuation lung mass is seen within the anterolateral aspect of the left upper lobe. An additional 2.3 cm x 1.9 cm low-attenuation mass is seen within the right upper lobe. A 3.7 cm x 3.6 cm low-attenuation lung mass is present within the left lower lobe. Multiple smaller noncalcified lung nodules are seen scattered throughout both lungs. There is no evidence of acute infiltrate, pleural effusion or pneumothorax. Upper Abdomen: There is a small hiatal hernia. Multiple surgical clips are seen within the gallbladder fossa. A 1.6 cm x 1.4 cm isodense left adrenal mass is noted. Musculoskeletal: No chest wall mass or suspicious bone lesions identified. IMPRESSION: 1. Multiple bilateral low-attenuation lung masses with smaller noncalcified lung nodules scattered throughout both lungs. These findings are concerning for metastatic disease. 2. 3.2 cm x 2.8 cm pretracheal lymph node. 3. 1.6 cm x 1.4 cm isodense left adrenal mass which may represent an adrenal adenoma. 4. Small hiatal hernia. 5. Evidence of prior cholecystectomy.  Electronically Signed   By: Virgina Norfolk M.D.   On: 08/08/2019 16:25   CT Angio Chest PE W/Cm &/Or Wo Cm  Result Date: 08/08/2019 CLINICAL DATA:  Abdominal pain and back pain. EXAM: CT ANGIOGRAPHY CHEST WITH CONTRAST TECHNIQUE: Multidetector CT imaging of the chest was performed using the standard protocol during bolus administration of intravenous contrast. Multiplanar CT image reconstructions and MIPs were obtained to evaluate the vascular  anatomy. CONTRAST:  24mL OMNIPAQUE IOHEXOL 350 MG/ML SOLN COMPARISON:  August 08, 2019 (3:58 p.m.) FINDINGS: Cardiovascular: There is mild calcification of the aortic arch. The pulmonary arteries are normal in appearance, without evidence of intraluminal filling defects. Normal heart size. No pericardial effusion. Mediastinum/Nodes: There is a 3.2 cm x 2.8 cm pretracheal lymph node. A 2.4 cm x 2.1 cm right hilar lymph node is also seen. Thyroid gland, trachea, and esophagus demonstrate no significant findings. Lungs/Pleura: A 2.9 cm x 2.8 cm lobulated noncalcified low-attenuation lung mass is seen within the anterolateral aspect of the left upper lobe. An additional 2.3 cm x 1.9 cm low-attenuation mass is seen within the right upper lobe. A 3.7 cm x 3.6 cm low-attenuation lung mass is present within the left lower lobe. Multiple smaller noncalcified lung nodules are seen scattered throughout both lungs. There is no evidence of acute infiltrate, pleural effusion or pneumothorax. Upper Abdomen: There is a small hiatal hernia. Multiple surgical clips are seen within the gallbladder fossa. A 1.6 cm x 1.4 cm isodense left adrenal mass is noted. Musculoskeletal: No chest wall mass or suspicious bone lesions identified. IMPRESSION: 1.  No evidence of pulmonary embolism. 2. Multiple bilateral low-attenuation lung masses with smaller noncalcified lung nodules scattered throughout both lungs. These findings are concerning for metastatic disease. 3.  Enlarged pretracheal and right hilar lymph nodes. 4. 1.6 cm x 1.4 cm isodense left adrenal mass which is likely consistent with an adrenal adenoma. 5. Evidence of prior cholecystectomy. Electronically Signed   By: Virgina Norfolk M.D.   On: 08/08/2019 18:54   DG Chest Port 1 View  Result Date: 08/08/2019 CLINICAL DATA:  64 year old female with acute shortness of breath. History of breast cancer. EXAM: PORTABLE CHEST 1 VIEW COMPARISON:  07/09/2012 FINDINGS: A 2.5 cm RIGHT UPPER  lobe mass, a 2.5 cm LEFT UPPER lobe mass and a 3.5 cm mid-LOWER LEFT lung mass are new and suspicious for metastatic disease. An equivocal mass at the MEDIAL LEFT lung base is noted. Fullness of the SUPERIOR mediastinum is noted. The cardiopericardial silhouette is unremarkable. No airspace disease, pleural effusion or pneumothorax. No acute or suspicious bony abnormalities are present. IMPRESSION: 1. New bilateral pulmonary masses and fullness of the SUPERIOR mediastinum, suspicious for metastatic disease. Recommend CT with contrast for further evaluation. Electronically Signed   By: Margarette Canada M.D.   On: 08/08/2019 10:44    Procedures Procedures (including critical care time)  Medications Ordered in ED Medications  fentaNYL (SUBLIMAZE) injection 50 mcg (50 mcg Intravenous Not Given 08/08/19 1735)  Ipratropium-Albuterol (COMBIVENT) respimat 2 puff (2 puffs Inhalation Given 08/08/19 1051)  morphine 2 MG/ML injection 2 mg (2 mg Intramuscular Given 08/08/19 1618)  Ipratropium-Albuterol (COMBIVENT) respimat 1 puff (1 puff Inhalation Given 08/08/19 1910)  iohexol (OMNIPAQUE) 350 MG/ML injection 80 mL (80 mLs Intravenous Contrast Given 08/08/19 1826)  fentaNYL (SUBLIMAZE) injection 50 mcg (50 mcg Intravenous Given 08/08/19 1921)    ED Course  I have reviewed the triage vital signs and the nursing notes.  Pertinent labs & imaging results that  were available during my care of the patient were reviewed by me and considered in my medical decision making (see chart for details).    MDM Rules/Calculators/A&P                         Yarah Seba Madole is a 64 y.o. female with pertinent past medical history of COPD, GERD, tobacco use, osteoarthritis that presents to the emergency department today for shortness of breath, arthralgias and myalgias.  Patient without any wheezing on exam, no cough noticed when I was examining her.  Patient states that cough is nonproductive.  Chest x-ray does show concerns for  metastatic disease, will obtain CT imaging at this time.  CMP without any acute abnormalities.  CBC with leukocytosis of 17.5.  First troponin 20.   No comparisons.  EKG without any signs of ischemia.  Was told by nursing that for nurses have tried with IV ultrasound, cannot get access.  Did discuss this with patient and Dr. Roderic Palau, will order CT without contrast at this time.  IM morphine given, chart states that she is allergic to morphine, patient states that she wants morphine, has taken it before.  States that she wants a very small dose like 2 mg.  Will order that IM.  CT chest concerning for metastatic disease.  Cannot exclude PE at this time with elevated troponin, chest pain and signs of cancer.   IV obtained and will now  rescan patient for CT PE study.  Patient's pain better controlled with morphine on board.  Patient states that shortness of breath has also improved with Combivent.  CT PE study shows no evidence of blood clot.  Did discuss findings with patient and patient's friend.  Will speak to oncologist at this time to let them know about patient. Dr. Delton Coombes aware of patient.  Patient to be discharged home with follow up.  Patient is allergic to Percocet and Norco, therefore will have patient take Tylenol as needed for pain until she sees oncologist.  Patient agreeable.  Doubt need for further emergent work up at this time. I explained the diagnosis and have given explicit precautions to return to the ER including for any other new or worsening symptoms. The patient understands and accepts the medical plan as it's been dictated and I have answered their questions. Discharge instructions concerning home care and prescriptions have been given. The patient is STABLE and is discharged to home in good condition.  I discussed this case with my attending physician who cosigned this note including patient's presenting symptoms, physical exam, and planned diagnostics and interventions.  Attending physician stated agreement with plan or made changes to plan which were implemented.   Attending physician assessed patient at bedside.    Final Clinical Impression(s) / ED Diagnoses Final diagnoses:  Shortness of breath  Abnormal CT scan of lung    Rx / DC Orders ED Discharge Orders    None       Alfredia Client, PA-C 08/08/19 1929    Tracy Chapel, MD 08/09/19 1452

## 2019-08-08 NOTE — ED Notes (Signed)
Pt. With Ct

## 2019-08-08 NOTE — Telephone Encounter (Signed)
Order refaxed to [facility]

## 2019-08-08 NOTE — ED Triage Notes (Signed)
Pt had a knee arthroscopy yesterday. She was intubated during this. She has acid reflux and did not take her acid reflux medicine yesterday. Yesterday, pt was napping and she began having abdominal pain and back pain. She is now having bilateral arm and leg pain.

## 2019-08-08 NOTE — ED Provider Notes (Signed)
This patient is a 64 year old female, had arthroscopic surgery of her right knee yesterday, since the procedure the patient states that she has been feeling poorly, diffuse body aches, occasional cough, she has some chest pain, she on exam has clear heart and lung sounds but appears a little bit tachypneic, she is speaking in full sentences, she has been a longtime smoker and does have a remote history of right-sided breast cancer though she reports that she has had a small lumpectomy chemo and radiation.  Her CT scan without contrast reveals multiple pulmonary areas which is likely related to cancer, I assisted with IV placement as she was a poor IV access and she now has a left upper extremity peripheral IV for an angiogram to rule out pulmonary embolism prior to deciding whether the patient is stable for discharge.  Hemodynamically the patient has stable vital signs.  Angiocath insertion Performed by: Johnna Acosta  Consent: Verbal consent obtained. Risks and benefits: risks, benefits and alternatives were discussed Time out: Immediately prior to procedure a "time out" was called to verify the correct patient, procedure, equipment, support staff and site/side marked as required.  Preparation: Patient was prepped and draped in the usual sterile fashion.  Vein Location: L upper arm  Ultrasound Guided  Gauge: 20  Normal blood return and flush without difficulty Patient tolerance: Patient tolerated the procedure well with no immediate complications.  Medical screening examination/treatment/procedure(s) were conducted as a shared visit with non-physician practitioner(s) and myself.  I personally evaluated the patient during the encounter.  Clinical Impression:   Final diagnoses:  Shortness of breath  Abnormal CT scan of lung          Noemi Chapel, MD 08/09/19 1452

## 2019-08-08 NOTE — Progress Notes (Signed)
1830 RT came to administer DPI and patient is out of room for procedure.

## 2019-08-08 NOTE — Telephone Encounter (Signed)
Detailed product description was received from [facility] for the walker that needs to be signed by [name]. He is on vacation next week, returning 08/18/19 this was left on VM to patient

## 2019-08-11 ENCOUNTER — Other Ambulatory Visit: Payer: Self-pay | Admitting: *Deleted

## 2019-08-11 ENCOUNTER — Telehealth: Payer: Self-pay | Admitting: Family Medicine

## 2019-08-11 DIAGNOSIS — Z9889 Other specified postprocedural states: Secondary | ICD-10-CM | POA: Insufficient documentation

## 2019-08-11 NOTE — Telephone Encounter (Signed)
Pt is having a hard time getting to the bathroom in time since she had knee surgery. Can we do an order for adult pull ups and send to [facility]?

## 2019-08-11 NOTE — Telephone Encounter (Signed)
[facility] for adult depends

## 2019-08-11 NOTE — Telephone Encounter (Signed)
Pt wants a rx for toilet seat also.

## 2019-08-12 ENCOUNTER — Other Ambulatory Visit: Payer: Self-pay

## 2019-08-12 ENCOUNTER — Other Ambulatory Visit: Payer: Self-pay | Admitting: Family Medicine

## 2019-08-12 DIAGNOSIS — K219 Gastro-esophageal reflux disease without esophagitis: Secondary | ICD-10-CM

## 2019-08-12 DIAGNOSIS — R159 Full incontinence of feces: Secondary | ICD-10-CM

## 2019-08-12 DIAGNOSIS — F339 Major depressive disorder, recurrent, unspecified: Secondary | ICD-10-CM

## 2019-08-12 DIAGNOSIS — J439 Emphysema, unspecified: Secondary | ICD-10-CM

## 2019-08-12 NOTE — Telephone Encounter (Signed)
Prescriptions for pullups and bedside commode faxed to [facility].

## 2019-08-12 NOTE — Telephone Encounter (Signed)
Ok for toilet seat

## 2019-08-15 ENCOUNTER — Ambulatory Visit (INDEPENDENT_AMBULATORY_CARE_PROVIDER_SITE_OTHER): Payer: Medicare HMO | Admitting: Orthopedic Surgery

## 2019-08-15 ENCOUNTER — Encounter (HOSPITAL_COMMUNITY): Payer: Self-pay

## 2019-08-15 ENCOUNTER — Ambulatory Visit (INDEPENDENT_AMBULATORY_CARE_PROVIDER_SITE_OTHER): Payer: Medicare HMO | Admitting: Licensed Clinical Social Worker

## 2019-08-15 ENCOUNTER — Other Ambulatory Visit: Payer: Self-pay

## 2019-08-15 DIAGNOSIS — F419 Anxiety disorder, unspecified: Secondary | ICD-10-CM | POA: Diagnosis not present

## 2019-08-15 DIAGNOSIS — F329 Major depressive disorder, single episode, unspecified: Secondary | ICD-10-CM

## 2019-08-15 DIAGNOSIS — M1711 Unilateral primary osteoarthritis, right knee: Secondary | ICD-10-CM | POA: Diagnosis not present

## 2019-08-15 DIAGNOSIS — J439 Emphysema, unspecified: Secondary | ICD-10-CM

## 2019-08-15 DIAGNOSIS — M5136 Other intervertebral disc degeneration, lumbar region: Secondary | ICD-10-CM

## 2019-08-15 DIAGNOSIS — Z9889 Other specified postprocedural states: Secondary | ICD-10-CM

## 2019-08-15 DIAGNOSIS — K219 Gastro-esophageal reflux disease without esophagitis: Secondary | ICD-10-CM

## 2019-08-15 MED ORDER — HYDROCODONE-ACETAMINOPHEN 5-325 MG PO TABS: 1.0000 | ORAL_TABLET | Freq: Four times a day (QID) | ORAL | 0 refills | Status: AC | PRN

## 2019-08-15 NOTE — Progress Notes (Signed)
Chief Complaint  Patient presents with  . Follow-up    Recheck on right knee, DOS 08-07-19.    64 years old 8 days postop complains of right knee pain suture line looks good sutures were removed patient can flex her knee past 90 degrees brings it to full extension no extensor lag  Recommend home exercise program for 4 weeks and follow-up  Continue ibuprofen and 5 days of hydrocodone for pain  Meds ordered this encounter  Medications  . HYDROcodone-acetaminophen (NORCO/VICODIN) 5-325 MG tablet    Sig: Take 1 tablet by mouth every 6 (six) hours as needed for up to 5 days for moderate pain or severe pain.    Dispense:  20 tablet    Refill:  0     Encounter Diagnosis  Name Primary?  . S/P right knee arthroscopy with chondroplasty medial condyle / 08/07/19 Yes

## 2019-08-15 NOTE — Chronic Care Management (AMB) (Signed)
Chronic Care Management    Clinical Social Work Follow Up Note  08/15/2019 Name: Tracy Neal MRN: [id number] DOB: [birth year]  Tracy Neal is a 64 y.o. year old female who is a primary care patient of [name], Fransisca Kaufmann, MD. The CCM team was consulted for assistance with [name] .   Review of patient status, including review of consultants reports, other relevant assessments, and collaboration with appropriate care team members and the patient's provider was performed as part of comprehensive patient evaluation and provision of chronic care management services.    SDOH (Social Determinants of Health) assessments performed: No; risk for tobacco use; risk for depression; risk for stress    Chronic Care Management from 06/06/2019 in [facility]  PHQ-9 Total Score 4       GAD 7 : Generalized Anxiety Score 06/06/2019 05/27/2019 03/20/2019  Nervous, Anxious, on Edge 1 3 3   Control/stop worrying 1 3 3   Worry too much - different things 1 3 3   Trouble relaxing 0 3 3  Restless 0 3 2  Easily annoyed or irritable 0 3 3  Afraid - awful might happen 1 3 3   Total GAD 7 Score 4 21 20   Anxiety Difficulty Somewhat difficult - -    Outpatient Encounter Medications as of 08/15/2019  Medication Sig Note  . albuterol (PROVENTIL) (2.5 MG/3ML) 0.083% nebulizer solution USE 1 VIAL IN NEBULIZER EVERY 6 HOURS AS NEEDED FOR WHEEZING OR SHORTNESS OF BREATH. (Needs to be seen before next refill) (Patient taking differently: Take 2.5 mg by nebulization every 6 (six) hours as needed for wheezing or shortness of breath. )   . albuterol (VENTOLIN HFA) 108 (90 Base) MCG/ACT inhaler INHALE 1-2 PUFFS EVERY 4 HOURS AS NEEDED FOR WHEEZING AND FOR SHORTNESS OF BREATH. Needs to be seen before next refill   . amitriptyline (ELAVIL) 25 MG tablet Take 1-2 tablets (25-50 mg total) by mouth at bedtime. (Needs to be seen before next refill) (Patient taking differently: Take 25-50 mg by  mouth at bedtime as needed for sleep. )   . budesonide-formoterol (SYMBICORT) 160-4.5 MCG/ACT inhaler Inhale 2 puffs into the lungs 2 (two) times daily. Needs to be seen before next refill   . Cyanocobalamin (B-12 PO) Take 1 tablet by mouth 2 (two) times a week.    . cyclobenzaprine (FLEXERIL) 10 MG tablet Take 1 tablet (10 mg total) by mouth at bedtime.   Marland Kitchen esomeprazole (NEXIUM) 40 MG capsule Take 1 capsule (40 mg total) by mouth daily. Needs to be seen before next refill   . famotidine-calcium carbonate-magnesium hydroxide (PEPCID COMPLETE) 10-800-165 MG chewable tablet Chew 2 tablets by mouth daily as needed (heartburn).   Marland Kitchen HYDROcodone-acetaminophen (NORCO/VICODIN) 5-325 MG tablet Take 1 tablet by mouth every 6 (six) hours as needed for up to 5 days for moderate pain or severe pain.   Marland Kitchen ibuprofen (IBU) 800 MG tablet Take 1 tablet (800 mg total) by mouth every 8 (eight) hours as needed. Needs to be seen before next refill   . nicotine (NICODERM CQ - DOSED IN MG/24 HOURS) 21 mg/24hr patch Place 1 patch (21 mg total) onto the skin daily. 08/08/2019: Has not started  . PARoxetine (PAXIL) 10 MG tablet Take 1 tablet (10 mg total) by mouth daily. Needs to be seen before next refill   . umeclidinium bromide (INCRUSE ELLIPTA) 62.5 MCG/INH AEPB Inhale 1 puff into the lungs daily. Needs to be seen before next refill    No facility-administered  encounter medications on file as of 08/15/2019.    Goals    .  Client will talk with LCSW in next 30 days about housing issues of client (pt-stated)      CARE PLAN ENTRY   Current Barriers:  . Housing challenges with client with chronic diagnoses of Osteopenia, COPD, Osteoporosis, OA, DDD, GERD, Anxiety ad Depression Breast Cancer   Clinical Social Work Clinical Goal(s):  Marland Kitchen LCSW to call client in next 30 days to discuss housing needs of client  Interventions:  Talked with client about CCM support Talked with client about social support network (son is  supportive) Talked with client about sleeping challenges of client Talked with client about upcoming medical appointments  Talked with client about pain issues of client (client had knee surgery this week) Talked with clinet about relaxation techniques (enjoys taking with family or friends, listens to listen to music, sits on porch to relax) Talked with client about anxiety issues of client Talked with client about her upcoming appointment with oncologist next Tuesday Talked with client about transport needs of client Talked with client about mood of client Talked with client about in home care needs of client Talked with client about ambulation needs (uses a cane to help her walk) Provided counseling support for client  Patient Self Care Activities:   Drives car to needed appointments and to do errands  Patient Self Care Deficits: . Housing challenges  Initial goal documentation       Follow Up Plan:LCSW to call client in next 4 weeks to talk with client about housing needs of client  Norva Riffle.Enmanuel Zufall MSW, LCSW Licensed Clinical Social Worker [facility] [phone]

## 2019-08-15 NOTE — Patient Instructions (Addendum)
Licensed Clinical Education officer, museum Visit Information  Goals we discussed today:      Client will talk with LCSW in next 30 days about housing issues of client (pt-stated)         CARE PLAN ENTRY   Current Barriers:   Housing challenges with client with chronic diagnoses of Osteopenia, COPD, Osteoporosis, OA, DDD, GERD, Anxiety ad Depression Breast Cancer   Clinical Social Work Clinical Goal(s):   LCSW to call client in next 30 days to discuss housing needs of client  Interventions:  Talked with client about CCM support Talked with client about social support network (son is supportive) Talked with client about sleeping challenges of client Talked with client about upcoming medical appointments  Talked with client about pain issues of client (client had knee surgery this week) Talked with clinet about relaxation techniques (enjoys taking with family or friends, listens to listen to music, sits on porch to relax) Talked with client about anxiety issues of client Talked with client about her upcoming appointment with oncologist next Tuesday Talked with client about transport needs of client Talked with client about mood of client Talked with client about in home care needs of client Talked with client about ambulation needs (uses a cane to help her walk) Provided counseling support for client  Patient Self Care Activities:   Drives car to needed appointments and to do errands  Patient Self Care Deficits:  Housing challenges  Initial goal documentation       Follow Up Plan:LCSW to call client in next 4 weeks to talk with client about housing needs of client  Materials Provided: No  The patient verbalized understanding of instructions provided today and declined a print copy of patient instruction materials.   Norva Riffle.Rashana Andrew MSW, LCSW Licensed Clinical Social Worker [facility] [phone]

## 2019-08-18 ENCOUNTER — Inpatient Hospital Stay (HOSPITAL_COMMUNITY): Payer: Medicare HMO

## 2019-08-18 ENCOUNTER — Encounter (HOSPITAL_COMMUNITY): Payer: Self-pay

## 2019-08-18 ENCOUNTER — Inpatient Hospital Stay (HOSPITAL_COMMUNITY): Payer: Medicare HMO | Attending: Hematology | Admitting: Hematology

## 2019-08-18 ENCOUNTER — Other Ambulatory Visit: Payer: Self-pay

## 2019-08-18 VITALS — BP 166/82 | HR 77 | Temp 97.0°F | Resp 20 | Wt 270.1 lb

## 2019-08-18 DIAGNOSIS — Z85828 Personal history of other malignant neoplasm of skin: Secondary | ICD-10-CM | POA: Insufficient documentation

## 2019-08-18 DIAGNOSIS — R918 Other nonspecific abnormal finding of lung field: Secondary | ICD-10-CM | POA: Insufficient documentation

## 2019-08-18 DIAGNOSIS — F17210 Nicotine dependence, cigarettes, uncomplicated: Secondary | ICD-10-CM | POA: Insufficient documentation

## 2019-08-18 DIAGNOSIS — F418 Other specified anxiety disorders: Secondary | ICD-10-CM | POA: Insufficient documentation

## 2019-08-18 DIAGNOSIS — K219 Gastro-esophageal reflux disease without esophagitis: Secondary | ICD-10-CM | POA: Insufficient documentation

## 2019-08-18 DIAGNOSIS — R911 Solitary pulmonary nodule: Secondary | ICD-10-CM

## 2019-08-18 DIAGNOSIS — M25551 Pain in right hip: Secondary | ICD-10-CM | POA: Insufficient documentation

## 2019-08-18 DIAGNOSIS — Z79899 Other long term (current) drug therapy: Secondary | ICD-10-CM | POA: Insufficient documentation

## 2019-08-18 DIAGNOSIS — Z9223 Personal history of estrogen therapy: Secondary | ICD-10-CM | POA: Diagnosis not present

## 2019-08-18 DIAGNOSIS — Z923 Personal history of irradiation: Secondary | ICD-10-CM | POA: Diagnosis not present

## 2019-08-18 DIAGNOSIS — C7800 Secondary malignant neoplasm of unspecified lung: Secondary | ICD-10-CM | POA: Insufficient documentation

## 2019-08-18 DIAGNOSIS — M810 Age-related osteoporosis without current pathological fracture: Secondary | ICD-10-CM | POA: Insufficient documentation

## 2019-08-18 DIAGNOSIS — M25552 Pain in left hip: Secondary | ICD-10-CM | POA: Diagnosis not present

## 2019-08-18 DIAGNOSIS — Z853 Personal history of malignant neoplasm of breast: Secondary | ICD-10-CM | POA: Insufficient documentation

## 2019-08-18 DIAGNOSIS — J449 Chronic obstructive pulmonary disease, unspecified: Secondary | ICD-10-CM | POA: Diagnosis not present

## 2019-08-18 DIAGNOSIS — M1990 Unspecified osteoarthritis, unspecified site: Secondary | ICD-10-CM | POA: Diagnosis not present

## 2019-08-18 DIAGNOSIS — Z9221 Personal history of antineoplastic chemotherapy: Secondary | ICD-10-CM | POA: Insufficient documentation

## 2019-08-18 DIAGNOSIS — E669 Obesity, unspecified: Secondary | ICD-10-CM | POA: Insufficient documentation

## 2019-08-18 NOTE — Patient Instructions (Addendum)
[facility] Discharge Instructions  You were seen and examined today by Dr. Delton Coombes. Dr. Delton Coombes discussed your past medical history, family history and any current symptoms (shortness of breath, coughing or weight loss).  There are several areas of concern present in both of your lungs. This could be related to your past history of breast cancer or lung cancer. It is important to identify what type of cancer this is.  You will have lab work done today. We call this lab work tumor markers. This will help identify the type of cancer.   Dr. Delton Coombes recommends having a biopsy performed to accurately identify this cancer. Dr. Delton Coombes has also recommended a PET scan which will show if there is cancer present elsewhere in your body.  We will see you again after these tests to go over test results and treatment options.   Thank you for choosing [facility] at [facility] to provide your oncology and hematology care.  To afford each patient quality time with our provider, please arrive at least 15 minutes before your scheduled appointment time.   If you have a lab appointment with the [facility] please come in thru the Main Entrance and check in at the main information desk.  You need to re-schedule your appointment should you arrive 10 or more minutes late.  We strive to give you quality time with our providers, and arriving late affects you and other patients whose appointments are after yours.  Also, if you no show three or more times for appointments you may be dismissed from the clinic at the providers discretion.     Again, thank you for choosing [facility].  Our hope is that these requests will decrease the amount of time that you wait before being seen by our physicians.       _____________________________________________________________  Should you have questions after your visit to [facility],  please contact our office at [phone] and follow the prompts.  Our office hours are 8:00 a.m. and 4:30 p.m. Monday - Friday.  Please note that voicemails left after 4:00 p.m. may not be returned until the following business day.  We are closed weekends and major holidays.  You do have access to a nurse 24-7, just call the main number to the clinic [phone] and do not press any options, hold on the line and a nurse will answer the phone.    For prescription refill requests, have your pharmacy contact our office and allow 72 hours.    Due to Covid, you will need to wear a mask upon entering the hospital. If you do not have a mask, a mask will be given to you at the Main Entrance upon arrival. For doctor visits, patients may have 1 support person age 25 or older with them. For treatment visits, patients can not have anyone with them due to social distancing guidelines and our immunocompromised population.

## 2019-08-18 NOTE — Progress Notes (Signed)
[place], [place] [address]   CLINIC:  Medical Oncology/Hematology  CONSULT NOTE  Patient Care Team: [name], Fransisca Kaufmann, MD as PCP - General (Family Medicine) Ilean China, RN as Registered Nurse Shea Evans, Norva Riffle, LCSW as Social Worker (Licensed Clinical Social Worker)  CHIEF COMPLAINTS/PURPOSE OF CONSULTATION:  Evaluation of bilateral lung masses  HISTORY OF PRESENTING ILLNESS:  [name] 64 y.o. female is here because of evaluation of bilateral lung masses, at the request of Dr. Noemi Chapel at [facility]. She presented to [facility] on 08/08/2019 after having an arthroscopic surgery of her right knee on 08/07/2019 complaining of fatigue, diffuse body aches, occasional cough, and some chest pain.  Today she reports that her hips are hurting, the right hip more than the left hip. She had a right breast lumpectomy with axillary lymph node biopsy by Dr. Coralie Keens on 07/15/2012 in [place] along with 4 cycles of chemo and 21 cycles of radiation treatment in [facility]; she then took anastrozole for less than 5 years. She denies F/C, night sweats, has not lost any weight unintentionally and denies having any new pains. Her cough is stable and she denies hemoptysis. Her appetite is good.  She was adopted by her extended family. Her paternal aunt deceased from some kind of cancer. She lives at home by herself, but has been staying with her son since her knee surgery. She ambulates with a cane at home. She smokes 1 PPD for 49 years. She had her last mammogram in 2017. She used to work in a [employer] for 7 years, in a [employer]ural [employer] for 7 years, in a bakery for 4 years.  MEDICAL HISTORY:  Past Medical History:  Diagnosis Date  . Anxiety   . Arthritis   . Basal cell carcinoma    not biopsy confirmed; Right bridge of nose  . Breast cancer ([place])   . COPD (chronic obstructive pulmonary disease) ([place])   . COPD, moderate ([place])   . Depression   .  GERD (gastroesophageal reflux disease)   . Shortness of breath dyspnea   . Umbilical hernia     SURGICAL HISTORY: Past Surgical History:  Procedure Laterality Date  . BREAST LUMPECTOMY Right 07/15/2012   Procedure: BREAST LUMPECTOMY WITH AXILLARY LYMPH NODE BIOPSY;  Surgeon: Harl Bowie, MD;  Location: [facility];  Service: General;  Laterality: Right;  Nuclear medicine injection 9:30   . CHOLECYSTECTOMY    . CHONDROPLASTY  08/07/2019   Procedure: CHONDROPLASTY MEDIAL CONDYAL;  Surgeon: Carole Civil, MD;  Location: [facility];  Service: Orthopedics;;  . COLONOSCOPY N/A 03/16/2015   Procedure: COLONOSCOPY;  Surgeon: Danie Binder, MD;  Location: [facility];  Service: Endoscopy;  Laterality: N/A;  2:30 PM - moved to 12:30 - office to notify pt  . KNEE ARTHROSCOPY WITH MEDIAL MENISECTOMY Right 08/07/2019   Procedure: KNEE ARTHROSCOPY WITH MEDIAL MENISECTOMY;  Surgeon: Carole Civil, MD;  Location: [facility];  Service: Orthopedics;  Laterality: Right;  . TUBAL LIGATION      SOCIAL HISTORY: Social History   Socioeconomic History  . Marital status: Single    Spouse name: Not on file  . Number of children: 1  . Years of education: Not on file  . Highest education level: Not on file  Occupational History  . Not on file  Tobacco Use  . Smoking status: Current Some Day Smoker    Packs/day: 0.50    Years: 40.00  Pack years: 20.00    Types: Cigarettes  . Smokeless tobacco: Never Used  Vaping Use  . Vaping Use: Never used  Substance and Sexual Activity  . Alcohol use: No  . Drug use: No  . Sexual activity: Not Currently    Birth control/protection: None  Other Topics Concern  . Not on file  Social History Narrative  . Not on file   Social Determinants of Health   Financial Resource Strain: Low Risk   . Difficulty of Paying Living Expenses: Not hard at all  Food Insecurity: No Food Insecurity  . Worried About Charity fundraiser in the Last Year: Never true  . Ran  Out of Food in the Last Year: Never true  Transportation Needs: No Transportation Needs  . Lack of Transportation (Medical): No  . Lack of Transportation (Non-Medical): No  Physical Activity: Inactive  . Days of Exercise per Week: 0 days  . Minutes of Exercise per Session: 0 min  Stress: Stress Concern Present  . Feeling of Stress : To some extent  Social Connections: Socially Isolated  . Frequency of Communication with Friends and Family: More than three times a week  . Frequency of Social Gatherings with Friends and Family: Once a week  . Attends Religious Services: Never  . Active Member of Clubs or Organizations: No  . Attends Archivist Meetings: Never  . Marital Status: Never married  Intimate Partner Violence: Not At Risk  . Fear of Current or Ex-Partner: No  . Emotionally Abused: No  . Physically Abused: No  . Sexually Abused: No    FAMILY HISTORY: Family History  Problem Relation Age of Onset  . Heart disease Mother   . Cancer Paternal Aunt   . Heart attack Sister     ALLERGIES:  is allergic to percocet [oxycodone-acetaminophen].  MEDICATIONS:  Current Outpatient Medications  Medication Sig Dispense Refill  . albuterol (PROVENTIL) (2.5 MG/3ML) 0.083% nebulizer solution USE 1 VIAL IN NEBULIZER EVERY 6 HOURS AS NEEDED FOR WHEEZING OR SHORTNESS OF BREATH. (Needs to be seen before next refill) (Patient not taking: Reported on 08/15/2019) 360 mL 0  . albuterol (VENTOLIN HFA) 108 (90 Base) MCG/ACT inhaler INHALE 1-2 PUFFS EVERY 4 HOURS AS NEEDED FOR WHEEZING AND FOR SHORTNESS OF BREATH. Needs to be seen before next refill (Patient not taking: Reported on 08/15/2019) 18 g 0  . amitriptyline (ELAVIL) 25 MG tablet Take 1-2 tablets (25-50 mg total) by mouth at bedtime. (Needs to be seen before next refill) (Patient taking differently: Take 25-50 mg by mouth at bedtime as needed for sleep. ) 60 tablet 0  . budesonide-formoterol (SYMBICORT) 160-4.5 MCG/ACT inhaler Inhale  2 puffs into the lungs 2 (two) times daily. Needs to be seen before next refill 10.2 g 0  . Cyanocobalamin (B-12 PO) Take 1 tablet by mouth 2 (two) times a week.     . cyclobenzaprine (FLEXERIL) 10 MG tablet Take 1 tablet (10 mg total) by mouth at bedtime. 30 tablet 0  . esomeprazole (NEXIUM) 40 MG capsule Take 1 capsule (40 mg total) by mouth daily. Needs to be seen before next refill 30 capsule 0  . famotidine-calcium carbonate-magnesium hydroxide (PEPCID COMPLETE) 10-800-165 MG chewable tablet Chew 2 tablets by mouth daily as needed (heartburn). (Patient not taking: Reported on 08/15/2019)    . HYDROcodone-acetaminophen (NORCO/VICODIN) 5-325 MG tablet Take 1 tablet by mouth every 6 (six) hours as needed for up to 5 days for moderate pain or severe pain. (Patient  not taking: Reported on 08/15/2019) 20 tablet 0  . ibuprofen (IBU) 800 MG tablet Take 1 tablet (800 mg total) by mouth every 8 (eight) hours as needed. Needs to be seen before next refill (Patient not taking: Reported on 08/15/2019) 60 tablet 0  . PARoxetine (PAXIL) 10 MG tablet Take 1 tablet (10 mg total) by mouth daily. Needs to be seen before next refill 30 tablet 0  . umeclidinium bromide (INCRUSE ELLIPTA) 62.5 MCG/INH AEPB Inhale 1 puff into the lungs daily. Needs to be seen before next refill 30 each 0   No current facility-administered medications for this visit.    REVIEW OF SYSTEMS:   Review of Systems  Constitutional: Positive for appetite change (mildly decreased) and fatigue (severe). Negative for chills, diaphoresis, fever and unexpected weight change.  Respiratory: Positive for shortness of breath (d/t COPD). Negative for hemoptysis.   Cardiovascular: Positive for leg swelling.  Musculoskeletal: Positive for arthralgias (9/10 R hip pain).  Neurological: Positive for numbness (R arm from wrist to shoulder occasionally).  Psychiatric/Behavioral: Positive for depression and sleep disturbance. The patient is nervous/anxious.     All other systems reviewed and are negative.    PHYSICAL EXAMINATION: ECOG PERFORMANCE STATUS: 1 - Symptomatic but completely ambulatory  Vitals:   08/18/19 0802  BP: (!) 166/82  Pulse: 77  Resp: 20  Temp: (!) 97 F (36.1 C)  SpO2: 99%   Filed Weights   08/18/19 0802  Weight: (!) 270 lb 1.6 oz (122.5 kg)   Physical Exam Vitals reviewed.  Constitutional:      Appearance: Normal appearance. She is obese.  Cardiovascular:     Rate and Rhythm: Normal rate and regular rhythm.     Pulses: Normal pulses.     Heart sounds: Normal heart sounds.  Pulmonary:     Effort: Pulmonary effort is normal.     Breath sounds: Normal breath sounds.  Abdominal:     Palpations: Abdomen is soft.     Tenderness: There is no abdominal tenderness.  Musculoskeletal:     Cervical back: Neck supple.     Right lower leg: Edema present.     Left lower leg: Edema present.  Lymphadenopathy:     Cervical: No cervical adenopathy.     Upper Body:     Right upper body: No supraclavicular adenopathy.     Left upper body: No supraclavicular adenopathy.  Neurological:     General: No focal deficit present.     Mental Status: She is alert and oriented to person, place, and time.  Psychiatric:        Mood and Affect: Mood normal.        Behavior: Behavior normal.      LABORATORY DATA:  I have reviewed the data as listed CBC Latest Ref Rng & Units 08/08/2019 08/05/2019 07/30/2017  WBC 4.0 - 10.5 K/uL 17.5(H) 13.7(H) 9.9  Hemoglobin 12.0 - 15.0 g/dL 14.2 15.6(H) 16.1(H)  Hematocrit 36 - 46 % 45.2 48.8(H) 48.4(H)  Platelets 150 - 400 K/uL 316 339 272   CMP Latest Ref Rng & Units 08/08/2019 08/05/2019 07/30/2017  Glucose 70 - 99 mg/dL 100(H) 90 124(H)  BUN 8 - 23 mg/dL 20 14 16   Creatinine 0.44 - 1.00 mg/dL 0.86 0.89 1.08(H)  Sodium 135 - 145 mmol/L 139 137 136  Potassium 3.5 - 5.1 mmol/L 3.8 4.0 4.7  Chloride 98 - 111 mmol/L 103 102 103  CO2 22 - 32 mmol/L 26 21(L) 21(L)  Calcium 8.9 - 10.3  mg/dL  9.4 9.2 9.0  Total Protein 6.5 - 8.1 g/dL 7.2 - -  Total Bilirubin 0.3 - 1.2 mg/dL 0.5 - -  Alkaline Phos 38 - 126 U/L 117 - -  AST 15 - 41 U/L 36 - -  ALT 0 - 44 U/L 24 - -    RADIOGRAPHIC STUDIES: I have personally reviewed the radiological images as listed and agreed with the findings in the report. CT Chest Wo Contrast  Result Date: 08/08/2019 CLINICAL DATA:  Abdominal pain and back pain. EXAM: CT CHEST WITHOUT CONTRAST TECHNIQUE: Multidetector CT imaging of the chest was performed following the standard protocol without IV contrast. COMPARISON:  None. FINDINGS: Cardiovascular: No significant vascular findings. Normal heart size. No pericardial effusion. Mediastinum/Nodes: There is a 3.2 cm x 2.8 cm pretracheal lymph node. Thyroid gland, trachea, and esophagus demonstrate no significant findings. Lungs/Pleura: A 2.9 cm x 2.8 cm lobulated noncalcified low-attenuation lung mass is seen within the anterolateral aspect of the left upper lobe. An additional 2.3 cm x 1.9 cm low-attenuation mass is seen within the right upper lobe. A 3.7 cm x 3.6 cm low-attenuation lung mass is present within the left lower lobe. Multiple smaller noncalcified lung nodules are seen scattered throughout both lungs. There is no evidence of acute infiltrate, pleural effusion or pneumothorax. Upper Abdomen: There is a small hiatal hernia. Multiple surgical clips are seen within the gallbladder fossa. A 1.6 cm x 1.4 cm isodense left adrenal mass is noted. Musculoskeletal: No chest wall mass or suspicious bone lesions identified. IMPRESSION: 1. Multiple bilateral low-attenuation lung masses with smaller noncalcified lung nodules scattered throughout both lungs. These findings are concerning for metastatic disease. 2. 3.2 cm x 2.8 cm pretracheal lymph node. 3. 1.6 cm x 1.4 cm isodense left adrenal mass which may represent an adrenal adenoma. 4. Small hiatal hernia. 5. Evidence of prior cholecystectomy. Electronically Signed   By:  Virgina Norfolk M.D.   On: 08/08/2019 16:25   CT Angio Chest PE W/Cm &/Or Wo Cm  Result Date: 08/08/2019 CLINICAL DATA:  Abdominal pain and back pain. EXAM: CT ANGIOGRAPHY CHEST WITH CONTRAST TECHNIQUE: Multidetector CT imaging of the chest was performed using the standard protocol during bolus administration of intravenous contrast. Multiplanar CT image reconstructions and MIPs were obtained to evaluate the vascular anatomy. CONTRAST:  40m OMNIPAQUE IOHEXOL 350 MG/ML SOLN COMPARISON:  August 08, 2019 (3:58 p.m.) FINDINGS: Cardiovascular: There is mild calcification of the aortic arch. The pulmonary arteries are normal in appearance, without evidence of intraluminal filling defects. Normal heart size. No pericardial effusion. Mediastinum/Nodes: There is a 3.2 cm x 2.8 cm pretracheal lymph node. A 2.4 cm x 2.1 cm right hilar lymph node is also seen. Thyroid gland, trachea, and esophagus demonstrate no significant findings. Lungs/Pleura: A 2.9 cm x 2.8 cm lobulated noncalcified low-attenuation lung mass is seen within the anterolateral aspect of the left upper lobe. An additional 2.3 cm x 1.9 cm low-attenuation mass is seen within the right upper lobe. A 3.7 cm x 3.6 cm low-attenuation lung mass is present within the left lower lobe. Multiple smaller noncalcified lung nodules are seen scattered throughout both lungs. There is no evidence of acute infiltrate, pleural effusion or pneumothorax. Upper Abdomen: There is a small hiatal hernia. Multiple surgical clips are seen within the gallbladder fossa. A 1.6 cm x 1.4 cm isodense left adrenal mass is noted. Musculoskeletal: No chest wall mass or suspicious bone lesions identified. IMPRESSION: 1.  No evidence of pulmonary embolism. 2. Multiple  bilateral low-attenuation lung masses with smaller noncalcified lung nodules scattered throughout both lungs. These findings are concerning for metastatic disease. 3.  Enlarged pretracheal and right hilar lymph nodes. 4. 1.6 cm  x 1.4 cm isodense left adrenal mass which is likely consistent with an adrenal adenoma. 5. Evidence of prior cholecystectomy. Electronically Signed   By: Virgina Norfolk M.D.   On: 08/08/2019 18:54   DG Chest Port 1 View  Result Date: 08/08/2019 CLINICAL DATA:  64 year old female with acute shortness of breath. History of breast cancer. EXAM: PORTABLE CHEST 1 VIEW COMPARISON:  07/09/2012 FINDINGS: A 2.5 cm RIGHT UPPER lobe mass, a 2.5 cm LEFT UPPER lobe mass and a 3.5 cm mid-LOWER LEFT lung mass are new and suspicious for metastatic disease. An equivocal mass at the MEDIAL LEFT lung base is noted. Fullness of the SUPERIOR mediastinum is noted. The cardiopericardial silhouette is unremarkable. No airspace disease, pleural effusion or pneumothorax. No acute or suspicious bony abnormalities are present. IMPRESSION: 1. New bilateral pulmonary masses and fullness of the SUPERIOR mediastinum, suspicious for metastatic disease. Recommend CT with contrast for further evaluation. Electronically Signed   By: Margarette Canada M.D.   On: 08/08/2019 10:44    ASSESSMENT:  1.  Multiple lung masses: -Presentation to the ER with chest pain and back pain. -CT angio of the chest on 08/08/2019 shows multiple bilateral lung masses consistent with metastatic disease.  Enlarged pretracheal and right hilar lymph nodes.  1.6 x 1.4 cm isodense left adrenal mass likely consistent with adrenal adenoma.  2.  T2N0 right breast IDC: -Lumpectomy and SLNB on 07/15/2012, 3.2 cm IDC, margins negative, 0/2 lymph nodes involved, ER 100%, PR 0%, HER-2 negative, Ki-67 18%. -Oncotype DX score of 28. -Received 4 cycles of TC by Dr. Trinna Balloon in [place].  She also received adjuvant radiation. -She took anastrozole for 2 years and lost to follow-up. -Last mammogram on 03/30/2015 was BI-RADS Category 2. -She does report on and off numbness in the right upper extremity including arm and forearm.  3.  Osteoporosis: -Bone density test on 04/07/2015  shows left femoral neck with a T score of -3.3.  4.  Tobacco use: -She is a current active smoker 1 pack/day for 49 years.  5.  Family history: -Paternal aunt died of "cancer".   PLAN:  1.  Multiple bilateral lung masses: -I reviewed the images of the scans with the patient in detail. -Given her history of right breast cancer, it is highly likely metastatic disease. -We will obtain biopsy of the lung nodules to confirm metastatic disease.  We will also obtain a PET scan as baseline. -We will also check tumor markers today. -We will see her back after the scan and biopsy.  2.  Right hip and knee pain: -She recently had arthroscopic procedure on 08/07/2019 for right knee medial meniscal tear. -She has been complaining of right hip pain since then.  She is being evaluated by Dr. Aline Brochure.  She is taking hydrocodone as needed.    All questions were answered. The patient knows to call the clinic with any problems, questions or concerns.  Derek Jack, MD, 08/18/19 8:42 AM  [place] [phone]   I, Milinda Antis, am acting as a scribe for Dr. Sanda Linger.  I, Derek Jack MD, have reviewed the above documentation for accuracy and completeness, and I agree with the above.

## 2019-08-18 NOTE — Progress Notes (Signed)
I met with patient today during her initial visit with Dr. Delton Coombes. I introduced myself and explained my role in the patient's care. I provided my contact information and encouraged the patient to call me with any questions or should any barriers arise. Patient states that she does not foresee any barriers to care at this time.

## 2019-08-19 LAB — CANCER ANTIGEN 15-3: CA 15-3: 141 U/mL — ABNORMAL HIGH (ref 0.0–25.0)

## 2019-08-19 LAB — CANCER ANTIGEN 27.29: CA 27.29: 191.5 U/mL — ABNORMAL HIGH (ref 0.0–38.6)

## 2019-08-22 ENCOUNTER — Ambulatory Visit (HOSPITAL_COMMUNITY)
Admission: RE | Admit: 2019-08-22 | Discharge: 2019-08-22 | Disposition: A | Discharge status: Home / Self Care | Payer: Medicare HMO | Source: Ambulatory Visit | Attending: Hematology | Admitting: Hematology

## 2019-08-22 ENCOUNTER — Other Ambulatory Visit: Payer: Self-pay

## 2019-08-22 ENCOUNTER — Other Ambulatory Visit: Payer: Self-pay | Admitting: *Deleted

## 2019-08-22 DIAGNOSIS — F419 Anxiety disorder, unspecified: Secondary | ICD-10-CM

## 2019-08-22 DIAGNOSIS — R911 Solitary pulmonary nodule: Secondary | ICD-10-CM | POA: Insufficient documentation

## 2019-08-22 DIAGNOSIS — C7951 Secondary malignant neoplasm of bone: Secondary | ICD-10-CM | POA: Insufficient documentation

## 2019-08-22 LAB — GLUCOSE, CAPILLARY: Glucose-Capillary: 95 mg/dL (ref 70–99)

## 2019-08-22 MED ORDER — AMITRIPTYLINE HCL 25 MG PO TABS: 25.0000 mg | ORAL_TABLET | Freq: Every day | ORAL | 0 refills | Status: DC

## 2019-08-22 MED ORDER — FLUDEOXYGLUCOSE F - 18 (FDG) INJECTION
12.1300 | Freq: Once | INTRAVENOUS | Status: AC | PRN
  Administered 2019-08-22: 11.42 via INTRAVENOUS

## 2019-08-24 ENCOUNTER — Encounter (HOSPITAL_COMMUNITY): Payer: Self-pay

## 2019-08-24 ENCOUNTER — Other Ambulatory Visit: Payer: Self-pay

## 2019-08-24 DIAGNOSIS — J449 Chronic obstructive pulmonary disease, unspecified: Secondary | ICD-10-CM | POA: Diagnosis not present

## 2019-08-24 DIAGNOSIS — M5441 Lumbago with sciatica, right side: Secondary | ICD-10-CM | POA: Insufficient documentation

## 2019-08-24 DIAGNOSIS — M25561 Pain in right knee: Secondary | ICD-10-CM | POA: Diagnosis present

## 2019-08-24 DIAGNOSIS — F17210 Nicotine dependence, cigarettes, uncomplicated: Secondary | ICD-10-CM | POA: Diagnosis not present

## 2019-08-24 NOTE — ED Triage Notes (Signed)
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

## 2019-08-24 NOTE — ED Triage Notes (Addendum)
Pt to er via ems, states that on the 22nd she had knee surgery.  Pt states that before then she had some pain, states that she is here today because her pain is worse and now she can't lift her leg because the pain is so bad, states that she used to get steroid shots and they helped.  Pt c/o R leg pain

## 2019-08-25 ENCOUNTER — Emergency Department (HOSPITAL_COMMUNITY)
Admission: EM | Admit: 2019-08-25 | Discharge: 2019-08-25 | Disposition: A | Discharge status: Home / Self Care | Payer: Medicare HMO | Attending: Emergency Medicine | Admitting: Emergency Medicine

## 2019-08-25 ENCOUNTER — Encounter (HOSPITAL_COMMUNITY): Payer: Self-pay | Admitting: Radiology

## 2019-08-25 DIAGNOSIS — M5431 Sciatica, right side: Secondary | ICD-10-CM

## 2019-08-25 DIAGNOSIS — M5441 Lumbago with sciatica, right side: Secondary | ICD-10-CM | POA: Diagnosis not present

## 2019-08-25 MED ORDER — ONDANSETRON 8 MG PO TBDP
8.0000 mg | ORAL_TABLET | Freq: Once | ORAL | Status: AC
  Administered 2019-08-25: 8 mg via ORAL
  Filled 2019-08-25: qty 1

## 2019-08-25 MED ORDER — METHOCARBAMOL 500 MG PO TABS
500.0000 mg | ORAL_TABLET | Freq: Three times a day (TID) | ORAL | 0 refills | Status: DC | PRN
Start: 2019-08-25 — End: 2020-10-04

## 2019-08-25 MED ORDER — METHYLPREDNISOLONE 4 MG PO TBPK
ORAL_TABLET | ORAL | 0 refills | Status: DC
Start: 2019-08-25 — End: 2019-09-12

## 2019-08-25 MED ORDER — HYDROMORPHONE HCL 1 MG/ML IJ SOLN
1.0000 mg | Freq: Once | INTRAMUSCULAR | Status: AC
  Administered 2019-08-25: 1 mg via INTRAMUSCULAR
  Filled 2019-08-25: qty 1

## 2019-08-25 MED ORDER — HYDROCODONE-ACETAMINOPHEN 5-325 MG PO TABS: 1.0000 | ORAL_TABLET | ORAL | 0 refills | Status: DC | PRN

## 2019-08-25 NOTE — ED Notes (Signed)
Pt to bathroom via WC and standby assist. Pt uses cane with ambulation.

## 2019-08-25 NOTE — Progress Notes (Signed)
[place]lin B. [place] Female, 64 y.o., November 12, 1955 MRN:  [id number] Phone:  [phone] (M) ... PCP:  [name], Fransisca Kaufmann, MD [facility] [employer] Medicare Hmo/Ppo Next Appt With Oncology 09/04/2019 at 12:00 PM  RE: CT Biopsy Received: Today Aletta Edouard, MD  Garth Bigness D OK for CT guided biopsy of right iliac bone lesion; try to schedule w/ me, if possible. If not possible and someone else does it, I spoke to Dr. Raliegh Ip and he really needs soft tissue component so I would focus on tissue just adjacent to the region of anterior cortical disruption extending into iliacus muscle. This is for receptor studies for recurrent breast CA and to avoid doing a lung biopsy.   GY       Previous Messages   ----- Message -----  From: Garth Bigness D  Sent: 08/22/2019  5:30 PM EDT  To: [facility] Procedure Requests  Subject: CT Biopsy                     Procedure: CT Biopsy   Reason: lung nodule   History: NM PET, CT in computer   Provider: Derek Jack   Provider Contact: [phone]

## 2019-08-25 NOTE — ED Provider Notes (Signed)
[facility] EMERGENCY DEPARTMENT Provider Note   CSN: [id number] Arrival date & time: 08/24/19  2142     History Chief Complaint  Patient presents with  . Knee Pain    Tracy Neal is a 64 y.o. female.  Patient presents to the emergency department for evaluation of right leg pain.  Patient reports that she has been experiencing this pain for some time but it has progressively worsened.  She underwent right knee surgery on July 22.  Since that time she has been experiencing pain in the right lower back, right hip radiating down to the right knee.  Pain worsens with movement of the right hip.        Past Medical History:  Diagnosis Date  . Anxiety   . Arthritis   . Basal cell carcinoma    not biopsy confirmed; Right bridge of nose  . Breast cancer ([place])   . COPD (chronic obstructive pulmonary disease) ([place])   . COPD, moderate ([place])   . Depression   . GERD (gastroesophageal reflux disease)   . Shortness of breath dyspnea   . Umbilical hernia     Patient Active Problem List   Diagnosis Date Noted  . Lung metastases ([place]) 08/18/2019  . S/P right knee arthroscopy with chondroplasty medial condyle / 08/07/19 08/11/2019  . Derangement of posterior horn of medial meniscus of right knee   . Morbid obesity ([place]) 06/09/2016  . GERD (gastroesophageal reflux disease) 06/10/2015  . Anxiety and depression 06/10/2015  . Osteoarthritis of right knee 05/13/2015  . Osteoarthritis of left knee 05/13/2015  . Degenerative disc disease, lumbar 05/13/2015  . Special screening for malignant neoplasms, colon   . Cocaine abuse ([place]) 12/22/2014  . Chronic pain syndrome 10/22/2014  . Lumbar facet arthropathy 10/22/2014  . Osteopenia 10/22/2014  . Tobacco abuse 10/22/2014  . Leucocytosis 12/31/2012  . Breast cancer ([place]) 12/20/2012  . COPD (chronic obstructive pulmonary disease) ([place]) 08/14/2012  . Cancer of central portion of female breast ([place]) 06/19/2012  . Breast lump on right  side at 12 o'clock position 06/11/2012    Past Surgical History:  Procedure Laterality Date  . BREAST LUMPECTOMY Right 07/15/2012   Procedure: BREAST LUMPECTOMY WITH AXILLARY LYMPH NODE BIOPSY;  Surgeon: Harl Bowie, MD;  Location: [facility];  Service: General;  Laterality: Right;  Nuclear medicine injection 9:30   . CHOLECYSTECTOMY    . CHONDROPLASTY  08/07/2019   Procedure: CHONDROPLASTY MEDIAL CONDYAL;  Surgeon: Carole Civil, MD;  Location: [facility];  Service: Orthopedics;;  . COLONOSCOPY N/A 03/16/2015   Procedure: COLONOSCOPY;  Surgeon: Danie Binder, MD;  Location: [facility];  Service: Endoscopy;  Laterality: N/A;  2:30 PM - moved to 12:30 - office to notify pt  . KNEE ARTHROSCOPY WITH MEDIAL MENISECTOMY Right 08/07/2019   Procedure: KNEE ARTHROSCOPY WITH MEDIAL MENISECTOMY;  Surgeon: Carole Civil, MD;  Location: [facility];  Service: Orthopedics;  Laterality: Right;  . TUBAL LIGATION       OB History    Gravida  2   Para  2   Term  2   Preterm      AB      Living  2     SAB      TAB      Ectopic      Multiple      Live Births              Family History  Problem Relation Age of  Onset  . Heart disease Mother   . Cancer Paternal Aunt   . Heart attack Sister     Social History   Tobacco Use  . Smoking status: Current Some Day Smoker    Packs/day: 0.50    Years: 40.00    Pack years: 20.00    Types: Cigarettes  . Smokeless tobacco: Never Used  Vaping Use  . Vaping Use: Never used  Substance Use Topics  . Alcohol use: No  . Drug use: No    Home Medications Prior to Admission medications   Medication Sig Start Date End Date Taking? Authorizing Provider  albuterol (PROVENTIL) (2.5 MG/3ML) 0.083% nebulizer solution USE 1 VIAL IN NEBULIZER EVERY 6 HOURS AS NEEDED FOR WHEEZING OR SHORTNESS OF BREATH. (Needs to be seen before next refill) Patient not taking: Reported on 08/15/2019 08/04/19   [name], Fransisca Kaufmann, MD  albuterol (VENTOLIN  HFA) 108 (90 Base) MCG/ACT inhaler INHALE 1-2 PUFFS EVERY 4 HOURS AS NEEDED FOR WHEEZING AND FOR SHORTNESS OF BREATH. Needs to be seen before next refill Patient not taking: Reported on 08/15/2019 08/12/19   [name], Fransisca Kaufmann, MD  amitriptyline (ELAVIL) 25 MG tablet Take 1-2 tablets (25-50 mg total) by mouth at bedtime. (Needs to be seen before next refill) 08/22/19   [name], Fransisca Kaufmann, MD  budesonide-formoterol [facility]) 160-4.5 MCG/ACT inhaler Inhale 2 puffs into the lungs 2 (two) times daily. Needs to be seen before next refill 08/12/19   [name], Fransisca Kaufmann, MD  Cyanocobalamin (B-12 PO) Take 1 tablet by mouth 2 (two) times a week.     [provider]  cyclobenzaprine (FLEXERIL) 10 MG tablet Take 1 tablet (10 mg total) by mouth at bedtime. 03/27/19   [name], Fransisca Kaufmann, MD  esomeprazole (NEXIUM) 40 MG capsule Take 1 capsule (40 mg total) by mouth daily. Needs to be seen before next refill 08/12/19   [name], Fransisca Kaufmann, MD  famotidine-calcium carbonate-magnesium hydroxide (PEPCID COMPLETE) 10-800-165 MG chewable tablet Chew 2 tablets by mouth daily as needed (heartburn). Patient not taking: Reported on 08/15/2019    [provider]  HYDROcodone-acetaminophen (NORCO/VICODIN) 5-325 MG tablet Take 1 tablet by mouth every 4 (four) hours as needed for moderate pain. 08/25/19   Orpah Greek, MD  ibuprofen (IBU) 800 MG tablet Take 1 tablet (800 mg total) by mouth every 8 (eight) hours as needed. Needs to be seen before next refill Patient not taking: Reported on 08/15/2019 08/12/19   [name], Fransisca Kaufmann, MD  methocarbamol (ROBAXIN) 500 MG tablet Take 1 tablet (500 mg total) by mouth every 8 (eight) hours as needed for muscle spasms. 08/25/19   Orpah Greek, MD  methylPREDNISolone (MEDROL DOSEPAK) 4 MG TBPK tablet As directed 08/25/19   Orpah Greek, MD  PARoxetine (PAXIL) 10 MG tablet Take 1 tablet (10 mg total) by mouth daily. Needs to be seen before next refill  08/12/19   [name], Fransisca Kaufmann, MD  umeclidinium bromide (INCRUSE ELLIPTA) 62.5 MCG/INH AEPB Inhale 1 puff into the lungs daily. Needs to be seen before next refill 08/12/19   [name], Fransisca Kaufmann, MD    Allergies    Percocet [oxycodone-acetaminophen]  Review of Systems   Review of Systems  Musculoskeletal: Positive for arthralgias and back pain.  All other systems reviewed and are negative.   Physical Exam Updated Vital Signs BP 115/63   Pulse 71   Temp 98.2 F (36.8 C) (Oral)   Resp 17   Ht 5\' 7"  (1.702 m)  Wt 122.5 kg   SpO2 92%   BMI 42.29 kg/m   Physical Exam Vitals and nursing note reviewed.  Constitutional:      General: She is not in acute distress.    Appearance: Normal appearance. She is well-developed.  HENT:     Head: Normocephalic and atraumatic.     Right Ear: Hearing normal.     Left Ear: Hearing normal.     Nose: Nose normal.  Eyes:     Conjunctiva/sclera: Conjunctivae normal.     Pupils: Pupils are equal, round, and reactive to light.  Cardiovascular:     Rate and Rhythm: Regular rhythm.     Heart sounds: S1 normal and S2 normal. No murmur heard.  No friction rub. No gallop.   Pulmonary:     Effort: Pulmonary effort is normal. No respiratory distress.     Breath sounds: Normal breath sounds.  Chest:     Chest wall: No tenderness.  Abdominal:     General: Bowel sounds are normal.     Palpations: Abdomen is soft.     Tenderness: There is no abdominal tenderness. There is no guarding or rebound. Negative signs include Murphy's sign and McBurney's sign.     Hernia: No hernia is present.  Musculoskeletal:     Cervical back: Normal range of motion and neck supple.     Lumbar back: Tenderness present. Positive right straight leg raise test.     Right hip: No deformity or tenderness. Decreased range of motion.     Right knee: No swelling, deformity, effusion or erythema. Normal range of motion. No tenderness.  Skin:    General: Skin is warm and dry.       Findings: No rash.  Neurological:     Mental Status: She is alert and oriented to person, place, and time.     GCS: GCS eye subscore is 4. GCS verbal subscore is 5. GCS motor subscore is 6.     Cranial Nerves: No cranial nerve deficit.     Sensory: No sensory deficit.     Coordination: Coordination normal.  Psychiatric:        Speech: Speech normal.        Behavior: Behavior normal.        Thought Content: Thought content normal.     ED Results / Procedures / Treatments   Labs (all labs ordered are listed, but only abnormal results are displayed) Labs Reviewed - No data to display  EKG None  Radiology No results found.  Procedures Procedures (including critical care time)  Medications Ordered in ED Medications  HYDROmorphone (DILAUDID) injection 1 mg (1 mg Intramuscular Given 08/25/19 0309)  ondansetron (ZOFRAN-ODT) disintegrating tablet 8 mg (8 mg Oral Given 08/25/19 0309)    ED Course  I have reviewed the triage vital signs and the nursing notes.  Pertinent labs & imaging results that were available during my care of the patient were reviewed by me and considered in my medical decision making (see chart for details).    MDM Rules/Calculators/A&P                          Patient presents to the emergency department for evaluation of leg pain.  She did have knee surgery on July 22.  Her knee exam, however, is unremarkable.  Surgical site of previous arthroscopy is well-healed.  There is no effusion, no swelling, no redness, no warmth.  She is able to range  of motion the knee.  Postoperative complications are therefore not felt to be likely.  Pain is particularly in the right posterior hip area.  There is tenderness in this region and she has severe pain with flexion of the hip.  This is consistent with acute sciatica.  No saddle anesthesia.  No foot drop.  Patient with some improvement after analgesia.  Discharge with prednisone, analgesia.  She can follow-up with primary  care or her orthopedist, Dr. Aline Brochure.  Final Clinical Impression(s) / ED Diagnoses Final diagnoses:  Sciatica of right side    Rx / DC Orders ED Discharge Orders         Ordered    methylPREDNISolone (MEDROL DOSEPAK) 4 MG TBPK tablet     Discontinue  Reprint     08/25/19 0638    HYDROcodone-acetaminophen (NORCO/VICODIN) 5-325 MG tablet  Every 4 hours PRN     Discontinue  Reprint     08/25/19 0638    methocarbamol (ROBAXIN) 500 MG tablet  Every 8 hours PRN     Discontinue     08/25/19 1219           Orpah Greek, MD 08/25/19 [phone]

## 2019-08-27 ENCOUNTER — Telehealth: Payer: Self-pay | Admitting: Orthopedic Surgery

## 2019-08-27 ENCOUNTER — Other Ambulatory Visit: Payer: Self-pay | Admitting: Radiology

## 2019-08-27 ENCOUNTER — Other Ambulatory Visit: Payer: Self-pay | Admitting: Family Medicine

## 2019-08-27 NOTE — Telephone Encounter (Signed)
I called her to advise  Told to finish the prednisone taper then go back on IBU and continue muscle relaxer  She voiced understanding

## 2019-08-27 NOTE — Telephone Encounter (Signed)
Declined no opioids

## 2019-08-27 NOTE — Telephone Encounter (Signed)
Tracy Neal called stating that her knee hurts, her hip hurts and her back hurts.  She says she has been to the ED twice since she was here last for sciatic pain.  She states that she has to have help in rolling over in bed because she cant move her leg.      She had knee surgery on 08/07/19.  She is having knee pain and hip pain today.  She asks for refill on Hydrocodone/Acetmainophen 5-325 mgs.  Patient uses [facility]

## 2019-08-28 ENCOUNTER — Telehealth (HOSPITAL_COMMUNITY): Payer: Self-pay | Admitting: *Deleted

## 2019-08-28 NOTE — Telephone Encounter (Signed)
Pt called stating that she would like to know her PET scan results and to know what type of cancer she has and how bad it is. I have tried twice to reach the pt at the number she gave [place] but it doesn't ring and there is no voicemail to leave a message. I also tried calling the alternate number but the pt no longer lives there and can only be reached at the number that doesn't work.

## 2019-08-29 ENCOUNTER — Ambulatory Visit (HOSPITAL_COMMUNITY): Admission: RE | Admit: 2019-08-29 | Payer: Medicare HMO | Source: Ambulatory Visit

## 2019-09-02 ENCOUNTER — Encounter (HOSPITAL_COMMUNITY): Payer: Self-pay

## 2019-09-02 NOTE — Progress Notes (Signed)
I attempted to reach patient today about cancelled biopsy. Unable to reach patient at this time. I left a voicemail asking that the patient return my phone call as soon as possible.

## 2019-09-04 ENCOUNTER — Ambulatory Visit (HOSPITAL_COMMUNITY): Payer: Medicare HMO | Admitting: Hematology

## 2019-09-11 ENCOUNTER — Other Ambulatory Visit: Payer: Self-pay | Admitting: Radiology

## 2019-09-12 ENCOUNTER — Ambulatory Visit (INDEPENDENT_AMBULATORY_CARE_PROVIDER_SITE_OTHER): Payer: Medicare HMO | Admitting: Orthopedic Surgery

## 2019-09-12 ENCOUNTER — Other Ambulatory Visit (HOSPITAL_COMMUNITY): Payer: Medicare HMO

## 2019-09-12 ENCOUNTER — Encounter: Payer: Self-pay | Admitting: Orthopedic Surgery

## 2019-09-12 ENCOUNTER — Ambulatory Visit: Payer: Medicare HMO

## 2019-09-12 ENCOUNTER — Other Ambulatory Visit: Payer: Self-pay

## 2019-09-12 DIAGNOSIS — M5431 Sciatica, right side: Secondary | ICD-10-CM

## 2019-09-12 MED ORDER — GABAPENTIN 300 MG PO CAPS: 300.0000 mg | ORAL_CAPSULE | Freq: Three times a day (TID) | ORAL | 5 refills | Status: DC

## 2019-09-12 MED ORDER — HYDROCODONE-ACETAMINOPHEN 5-325 MG PO TABS: 1.0000 | ORAL_TABLET | Freq: Four times a day (QID) | ORAL | 0 refills | Status: DC | PRN

## 2019-09-12 NOTE — Progress Notes (Signed)
Chief Complaint  Patient presents with  . Routine Post Op    08/07/19 right knee  . Gait Problem    cant walk has been to hospital x 12    64 year old female had knee arthroscopy on the right on July 22 went back to the ER on the 23rd with shortness of breath at which time she was found to have 2 nodules which needed further work-up she was back in the ER again on 9 August with sciatic nerve irritation was placed on a Dosepak Norco and Robaxin comes in with severe right leg pain and inability to bear weight on her right leg  Mild swelling right knee seems to have minimal symptoms from that tenderness in the right lower back and positive straight leg raise on the right with mild weakness of the knee extension and dorsiflexion of the foot decreased sensation noted on the lateral side of the thigh  O-R/ office xrays   Lumbar spine x-ray scoliosis ddd    Encounter Diagnosis  Name Primary?  . Sciatica of right side Yes    Recommend MRI lumbar spine  Start gabapentin 300 3 times daily continue Norco 5 mg  [name] AFTER MRI

## 2019-09-12 NOTE — Addendum Note (Signed)
Addended byCandice Camp on: 09/12/2019 11:16 AM   Modules accepted: Orders

## 2019-09-15 ENCOUNTER — Other Ambulatory Visit (HOSPITAL_COMMUNITY)
Admission: RE | Admit: 2019-09-15 | Discharge: 2019-09-15 | Disposition: A | Discharge status: Home / Self Care | Payer: Medicare HMO | Source: Ambulatory Visit | Attending: Hematology | Admitting: Hematology

## 2019-09-15 ENCOUNTER — Other Ambulatory Visit (HOSPITAL_COMMUNITY): Admission: RE | Admit: 2019-09-15 | Payer: Medicare HMO | Source: Ambulatory Visit

## 2019-09-15 ENCOUNTER — Other Ambulatory Visit: Payer: Self-pay

## 2019-09-15 DIAGNOSIS — Z01812 Encounter for preprocedural laboratory examination: Secondary | ICD-10-CM | POA: Diagnosis present

## 2019-09-15 DIAGNOSIS — Z20822 Contact with and (suspected) exposure to covid-19: Secondary | ICD-10-CM | POA: Diagnosis not present

## 2019-09-15 LAB — SARS CORONAVIRUS 2 (TAT 6-24 HRS): SARS Coronavirus 2: NEGATIVE

## 2019-09-16 ENCOUNTER — Other Ambulatory Visit: Payer: Self-pay | Admitting: Radiology

## 2019-09-16 ENCOUNTER — Telehealth: Payer: Self-pay | Admitting: Orthopedic Surgery

## 2019-09-16 ENCOUNTER — Other Ambulatory Visit: Payer: Self-pay | Admitting: Orthopedic Surgery

## 2019-09-16 ENCOUNTER — Other Ambulatory Visit: Payer: Self-pay | Admitting: Family Medicine

## 2019-09-16 DIAGNOSIS — J439 Emphysema, unspecified: Secondary | ICD-10-CM

## 2019-09-16 DIAGNOSIS — F419 Anxiety disorder, unspecified: Secondary | ICD-10-CM

## 2019-09-16 DIAGNOSIS — M5431 Sciatica, right side: Secondary | ICD-10-CM

## 2019-09-16 MED ORDER — DIAZEPAM 5 MG PO TABS: 5.0000 mg | ORAL_TABLET | Freq: Four times a day (QID) | ORAL | 0 refills | Status: DC | PRN

## 2019-09-16 NOTE — Telephone Encounter (Signed)
[name] NTBS 30 days given 08/12/19

## 2019-09-16 NOTE — Telephone Encounter (Signed)
Patient called and states she is claustrophobic she is requesting something to help her with the MRI her appt is on 09/29/19 for the MRI.  Her pharmacy is [facility]

## 2019-09-17 ENCOUNTER — Other Ambulatory Visit: Payer: Self-pay

## 2019-09-17 ENCOUNTER — Ambulatory Visit (HOSPITAL_COMMUNITY)
Admission: RE | Admit: 2019-09-17 | Discharge: 2019-09-17 | Disposition: A | Discharge status: Home / Self Care | Payer: Medicare HMO | Source: Ambulatory Visit | Attending: Hematology | Admitting: Hematology

## 2019-09-17 DIAGNOSIS — Z79899 Other long term (current) drug therapy: Secondary | ICD-10-CM | POA: Insufficient documentation

## 2019-09-17 DIAGNOSIS — R911 Solitary pulmonary nodule: Secondary | ICD-10-CM

## 2019-09-17 DIAGNOSIS — Z853 Personal history of malignant neoplasm of breast: Secondary | ICD-10-CM | POA: Diagnosis not present

## 2019-09-17 DIAGNOSIS — F418 Other specified anxiety disorders: Secondary | ICD-10-CM | POA: Insufficient documentation

## 2019-09-17 DIAGNOSIS — C778 Secondary and unspecified malignant neoplasm of lymph nodes of multiple regions: Secondary | ICD-10-CM | POA: Diagnosis not present

## 2019-09-17 DIAGNOSIS — C7801 Secondary malignant neoplasm of right lung: Secondary | ICD-10-CM | POA: Diagnosis not present

## 2019-09-17 DIAGNOSIS — F17210 Nicotine dependence, cigarettes, uncomplicated: Secondary | ICD-10-CM | POA: Insufficient documentation

## 2019-09-17 DIAGNOSIS — C7951 Secondary malignant neoplasm of bone: Secondary | ICD-10-CM | POA: Diagnosis not present

## 2019-09-17 DIAGNOSIS — K219 Gastro-esophageal reflux disease without esophagitis: Secondary | ICD-10-CM | POA: Diagnosis not present

## 2019-09-17 DIAGNOSIS — C7802 Secondary malignant neoplasm of left lung: Secondary | ICD-10-CM | POA: Diagnosis not present

## 2019-09-17 DIAGNOSIS — J449 Chronic obstructive pulmonary disease, unspecified: Secondary | ICD-10-CM | POA: Diagnosis not present

## 2019-09-17 DIAGNOSIS — R918 Other nonspecific abnormal finding of lung field: Secondary | ICD-10-CM | POA: Diagnosis present

## 2019-09-17 LAB — CBC
HCT: 44.8 % (ref 36.0–46.0)
Hemoglobin: 14 g/dL (ref 12.0–15.0)
MCH: 27.2 pg (ref 26.0–34.0)
MCHC: 31.3 g/dL (ref 30.0–36.0)
MCV: 87.2 fL (ref 80.0–100.0)
Platelets: 320 10*3/uL (ref 150–400)
RBC: 5.14 MIL/uL — ABNORMAL HIGH (ref 3.87–5.11)
RDW: 15.2 % (ref 11.5–15.5)
WBC: 10.6 10*3/uL — ABNORMAL HIGH (ref 4.0–10.5)
nRBC: 0 % (ref 0.0–0.2)

## 2019-09-17 LAB — PROTIME-INR
INR: 1 (ref 0.8–1.2)
Prothrombin Time: 12.6 seconds (ref 11.4–15.2)

## 2019-09-17 MED ORDER — MIDAZOLAM HCL 2 MG/2ML IJ SOLN
INTRAMUSCULAR | Status: AC
  Filled 2019-09-17: qty 4

## 2019-09-17 MED ORDER — FENTANYL CITRATE (PF) 100 MCG/2ML IJ SOLN
INTRAMUSCULAR | Status: AC
  Filled 2019-09-17: qty 4

## 2019-09-17 MED ORDER — SODIUM CHLORIDE 0.9 % IV SOLN: INTRAVENOUS | Status: DC

## 2019-09-17 MED ORDER — MIDAZOLAM HCL 2 MG/2ML IJ SOLN
INTRAMUSCULAR | Status: AC | PRN
  Administered 2019-09-17 (×2): 1 mg via INTRAVENOUS

## 2019-09-17 MED ORDER — GELATIN ABSORBABLE 12-7 MM EX MISC
CUTANEOUS | Status: AC
  Filled 2019-09-17: qty 1

## 2019-09-17 MED ORDER — LIDOCAINE HCL 1 % IJ SOLN
INTRAMUSCULAR | Status: AC
  Filled 2019-09-17: qty 20

## 2019-09-17 MED ORDER — FENTANYL CITRATE (PF) 100 MCG/2ML IJ SOLN
INTRAMUSCULAR | Status: AC | PRN
  Administered 2019-09-17 (×2): 50 ug via INTRAVENOUS

## 2019-09-17 NOTE — H&P (Signed)
Chief Complaint: Patient was seen in consultation today for a right iliac lesion biopsy  Referring Physician(s): Derek Jack  Supervising Physician: Aletta Edouard  Patient Status: [facility] - Out-pt  History of Present Illness: Tracy Neal is a 64 y.o. female with a medical history significant for anxiety/depression, COPD and breast cancer, diagnosed 2014. She presented to the ED on 08/08/19 with fatigue, diffuse body aches, chest pain and cough. A CT angio of the chest was done with results demonstrating multiple bilateral lung masses consistent with metastatic disease. Additional imaging was ordered.   NM PET 08/22/19: IMPRESSION: 1. Widespread hypermetabolic metastatic disease throughout mediastinal and hilar lymph nodes, the lungs and bones, presumably metastatic breast cancer. Tissue sampling recommended.  SKELETON:  There is multifocal osseous metastatic disease, especially within  the bony pelvis with there are multiple lytic lesions throughout the  right iliac bone and pubic rami. A large lytic lesion in the right  iliac wing has an SUV max of 24.5. There is bilateral sacral  involvement. Multiple metastases are present within the lower lumbar  spine, most notably in the L2 spinous process (SUV max 18.7) and  throughout the posterior elements at L4 (SUV max 17.9). No  significant proximal femoral involvement. There is a small lesion in  the proximal left humerus. 2. Small hypermetabolic left adrenal nodule, likely a metastasis. 3. No involvement of the liver identified. 4. Large supraumbilical hernia containing only fat. 5. Indeterminate perineal activity; correlate clinically for signs of vesicovaginal fistula.  Interventional Radiology has been asked to evaluate this patient for an image-guided right iliac lesion biopsy for further work-up and diagnosis.   Past Medical History:  Diagnosis Date  . Anxiety   . Arthritis   . Basal cell carcinoma    not  biopsy confirmed; Right bridge of nose  . Breast cancer ([place])   . COPD (chronic obstructive pulmonary disease) ([place])   . COPD, moderate ([place])   . Depression   . GERD (gastroesophageal reflux disease)   . Shortness of breath dyspnea   . Umbilical hernia     Past Surgical History:  Procedure Laterality Date  . BREAST LUMPECTOMY Right 07/15/2012   Procedure: BREAST LUMPECTOMY WITH AXILLARY LYMPH NODE BIOPSY;  Surgeon: Harl Bowie, MD;  Location: [place];  Service: General;  Laterality: Right;  Nuclear medicine injection 9:30   . CHOLECYSTECTOMY    . CHONDROPLASTY  08/07/2019   Procedure: CHONDROPLASTY MEDIAL CONDYAL;  Surgeon: Carole Civil, MD;  Location: [facility];  Service: Orthopedics;;  . COLONOSCOPY N/A 03/16/2015   Procedure: COLONOSCOPY;  Surgeon: Danie Binder, MD;  Location: [facility];  Service: Endoscopy;  Laterality: N/A;  2:30 PM - moved to 12:30 - office to notify pt  . KNEE ARTHROSCOPY WITH MEDIAL MENISECTOMY Right 08/07/2019   Procedure: KNEE ARTHROSCOPY WITH MEDIAL MENISECTOMY;  Surgeon: Carole Civil, MD;  Location: [facility];  Service: Orthopedics;  Laterality: Right;  . TUBAL LIGATION      Allergies: Percocet [oxycodone-acetaminophen]  Medications: Prior to Admission medications   Medication Sig Start Date End Date Taking? Authorizing Provider  albuterol (PROVENTIL) (2.5 MG/3ML) 0.083% nebulizer solution USE 1 VIAL IN NEBULIZER EVERY 6 HOURS AS NEEDED FOR WHEEZING OR SHORTNESS OF BREATH. (Needs to be seen before next refill) Patient taking differently: Take 2.5 mg by nebulization See admin instructions. Every three hours 08/04/19  Yes [name], Fransisca Kaufmann, MD  albuterol (VENTOLIN HFA) 108 (90 Base) MCG/ACT inhaler INHALE 1-2 PUFFS EVERY 4 HOURS  AS NEEDED FOR WHEEZING AND FOR SHORTNESS OF BREATH. Needs to be seen before next refill Patient taking differently: Inhale 2 puffs into the lungs every 6 (six) hours as needed for wheezing or shortness of breath.   08/12/19  Yes [name], Fransisca Kaufmann, MD  amitriptyline (ELAVIL) 25 MG tablet Take 1-2 tablets (25-50 mg total) by mouth at bedtime. (Needs to be seen before next refill) Patient taking differently: Take 25 mg by mouth at bedtime.  08/22/19  Yes [name], Fransisca Kaufmann, MD  budesonide-formoterol (SYMBICORT) 160-4.5 MCG/ACT inhaler Inhale 2 puffs into the lungs 2 (two) times daily. Needs to be seen before next refill Patient taking differently: Inhale 2 puffs into the lungs daily as needed (shortness of breath).  08/12/19  Yes [name], Fransisca Kaufmann, MD  Cyanocobalamin (B-12 PO) Take 1,000 mcg by mouth 2 (two) times a week.    Yes [provider]  cyclobenzaprine (FLEXERIL) 10 MG tablet Take 1 tablet (10 mg total) by mouth at bedtime. Patient taking differently: Take 10 mg by mouth 2 (two) times daily.  03/27/19  Yes [name], Fransisca Kaufmann, MD  esomeprazole (NEXIUM) 40 MG capsule Take 1 capsule (40 mg total) by mouth daily. Needs to be seen before next refill Patient taking differently: Take 40 mg by mouth daily.  08/12/19  Yes [name], Fransisca Kaufmann, MD  famotidine-calcium carbonate-magnesium hydroxide (PEPCID COMPLETE) 10-800-165 MG chewable tablet Chew 2 tablets by mouth daily as needed (heartburn).    Yes [provider]  gabapentin (NEURONTIN) 300 MG capsule Take 1 capsule (300 mg total) by mouth 3 (three) times daily. 09/12/19  Yes Carole Civil, MD  HYDROcodone-acetaminophen (NORCO/VICODIN) 5-325 MG tablet Take 1 tablet by mouth every 6 (six) hours as needed for moderate pain. 09/12/19  Yes Carole Civil, MD  IBU 800 MG tablet TAKE (1) TABLET EVERY EIGHT HOURS AS NEEDED. 08/28/19  Yes [name], Fransisca Kaufmann, MD  methocarbamol (ROBAXIN) 500 MG tablet Take 1 tablet (500 mg total) by mouth every 8 (eight) hours as needed for muscle spasms. 08/25/19  Yes Pollina, Gwenyth Allegra, MD  PARoxetine (PAXIL) 10 MG tablet Take 1 tablet (10 mg total) by mouth daily. Needs to be seen before next refill 08/12/19   Yes [name], Fransisca Kaufmann, MD  umeclidinium bromide (INCRUSE ELLIPTA) 62.5 MCG/INH AEPB Inhale 1 puff into the lungs daily. Needs to be seen before next refill 08/12/19  Yes [name], Fransisca Kaufmann, MD  diazepam (VALIUM) 5 MG tablet Take 1 tablet (5 mg total) by mouth every 6 (six) hours as needed for anxiety. 09/16/19   Carole Civil, MD     Family History  Problem Relation Age of Onset  . Heart disease Mother   . Cancer Paternal Aunt   . Heart attack Sister     Social History   Socioeconomic History  . Marital status: Single    Spouse name: Not on file  . Number of children: 1  . Years of education: Not on file  . Highest education level: Not on file  Occupational History  . Not on file  Tobacco Use  . Smoking status: Current Some Day Smoker    Packs/day: 0.50    Years: 40.00    Pack years: 20.00    Types: Cigarettes  . Smokeless tobacco: Never Used  Vaping Use  . Vaping Use: Never used  Substance and Sexual Activity  . Alcohol use: No  . Drug use: No  . Sexual activity: Not Currently    Birth control/protection:  None  Other Topics Concern  . Not on file  Social History Narrative  . Not on file   Social Determinants of Health   Financial Resource Strain: Low Risk   . Difficulty of Paying Living Expenses: Not hard at all  Food Insecurity: No Food Insecurity  . Worried About Charity fundraiser in the Last Year: Never true  . Ran Out of Food in the Last Year: Never true  Transportation Needs: No Transportation Needs  . Lack of Transportation (Medical): No  . Lack of Transportation (Non-Medical): No  Physical Activity: Inactive  . Days of Exercise per Week: 0 days  . Minutes of Exercise per Session: 0 min  Stress: Stress Concern Present  . Feeling of Stress : To some extent  Social Connections: Socially Isolated  . Frequency of Communication with Friends and Family: More than three times a week  . Frequency of Social Gatherings with Friends and Family: Once a  week  . Attends Religious Services: Never  . Active Member of Clubs or Organizations: No  . Attends Archivist Meetings: Never  . Marital Status: Never married    Review of Systems: A 12 point ROS discussed and pertinent positives are indicated in the HPI above.  All other systems are negative.  Review of Systems  Constitutional: Negative for appetite change and fatigue.  Respiratory: Positive for shortness of breath. Negative for cough.        Occasional shortness of breath; hx of COPD.  Cardiovascular: Positive for leg swelling. Negative for chest pain.  Gastrointestinal: Negative for abdominal pain, diarrhea, nausea and vomiting.  Musculoskeletal: Positive for arthralgias.  Neurological: Negative for headaches.    Vital Signs: BP (!) 151/82   Pulse 72   Temp 98.4 F (36.9 C) (Oral)   Resp 18   Ht 5\' 7"  (1.702 m)   Wt 270 lb (122.5 kg)   SpO2 96%   BMI 42.29 kg/m   Physical Exam Constitutional:      General: She is not in acute distress. HENT:     Mouth/Throat:     Mouth: Mucous membranes are moist.     Pharynx: Oropharynx is clear.  Cardiovascular:     Rate and Rhythm: Normal rate and regular rhythm.     Pulses: Normal pulses.     Heart sounds: Normal heart sounds.  Pulmonary:     Effort: Pulmonary effort is normal.     Breath sounds: Decreased air movement present.  Abdominal:     General: Bowel sounds are normal.     Palpations: Abdomen is soft.  Musculoskeletal:     Lumbar back: Decreased range of motion.     Comments: Ambulates with a cane; lower back pain; sciatica.   Skin:    General: Skin is warm and dry.  Neurological:     Mental Status: She is alert and oriented to person, place, and time.     Imaging: DG Lumbar Spine 2-3 Views  Result Date: 09/12/2019 Imaging lumbar spine sciatica right leg Moderate lumbar scoliosis flattening of the lumbar spine several areas of degenerative disc disease facet arthritis Impression spondylosis and  lumbar scoliosis  NM PET Image Initial (PI) Skull Base To Thigh  Result Date: 08/22/2019 CLINICAL DATA:  Initial treatment strategy for bilateral pulmonary nodules. History of breast cancer with right lumpectomy in 2014. EXAM: NUCLEAR MEDICINE PET SKULL BASE TO THIGH TECHNIQUE: 11.42 mCi F-18 FDG was injected intravenously. Full-ring PET imaging was performed from the skull base to  thigh after the radiotracer. CT data was obtained and used for attenuation correction and anatomic localization. Fasting blood glucose: 95 mg/dl COMPARISON:  Chest CT 08/08/2019 FINDINGS: Mediastinal blood pool activity: SUV max 2.9 NECK: No hypermetabolic cervical lymph nodes are identified.There are no lesions of the pharyngeal mucosal space. Metabolic activity within the lymphoid tissue of Waldeyer's ring is within physiologic limits. Incidental CT findings: none CHEST: There are multiple enlarged and hypermetabolic mediastinal and hilar lymph nodes bilaterally. Representative lesions include a 2.2 cm right paratracheal node on image 59/4 (SUV max 30.8) and a subcarinal node measuring 1.4 cm on image 71/4 (SUV max 33.2). There are no hypermetabolic axillary or internal mammary lymph nodes. Multiple hypermetabolic pulmonary nodules/masses are present bilaterally. Representative nodules include 1 in the right upper lobe measuring 2.4 cm on image 19/8 (SUV max 25.5) and 1 in the left lower lobe measuring 4.0 x 3.4 cm on image 36/8 (SUV max 26.4). These nodules are similar to those seen on the recent CT. Incidental CT findings: Mild aortic atherosclerosis. Postsurgical changes in the right breast without abnormal chest wall activity in this area. ABDOMEN/PELVIS: There is a hypermetabolic 1.7 cm left adrenal nodule (SUV max 12.4). No hypermetabolic activity is present within the right adrenal gland, liver, spleen or pancreas. There is no hypermetabolic nodal activity. Bladder activity appears to extend posteriorly into the vagina, and a  vesicovaginal fistula is difficult to exclude based on this study. There is no specific evidence for that on the CT images. Incidental CT findings: Hepatic low density post cholecystectomy. There is a large supraumbilical hernia containing only fat. Mild aortic and branch vessel atherosclerosis. Mild to moderate sigmoid diverticulosis. SKELETON: There is multifocal osseous metastatic disease, especially within the bony pelvis with there are multiple lytic lesions throughout the right iliac bone and pubic rami. A large lytic lesion in the right iliac wing has an SUV max of 24.5. There is bilateral sacral involvement. Multiple metastases are present within the lower lumbar spine, most notably in the L2 spinous process (SUV max 18.7) and throughout the posterior elements at L4 (SUV max 17.9). No significant proximal femoral involvement. There is a small lesion in the proximal left humerus. Incidental CT findings: Underlying lumbar spondylosis contributing to spondylosis. No gross epidural tumor. IMPRESSION: 1. Widespread hypermetabolic metastatic disease throughout mediastinal and hilar lymph nodes, the lungs and bones, presumably metastatic breast cancer. Tissue sampling recommended. 2. Small hypermetabolic left adrenal nodule, likely a metastasis. 3. No involvement of the liver identified. 4. Large supraumbilical hernia containing only fat. 5. Indeterminate perineal activity; correlate clinically for signs of vesicovaginal fistula. Electronically Signed   By: Richardean Sale M.D.   On: 08/22/2019 16:59    Labs:  CBC: Recent Labs    08/05/19 1345 08/08/19 1037  WBC 13.7* 17.5*  HGB 15.6* 14.2  HCT 48.8* 45.2  PLT 339 316    COAGS: No results for input(s): INR, APTT in the last 8760 hours.  BMP: Recent Labs    08/05/19 1345 08/08/19 1037  NA 137 139  K 4.0 3.8  CL 102 103  CO2 21* 26  GLUCOSE 90 100*  BUN 14 20  CALCIUM 9.2 9.4  CREATININE 0.89 0.86  GFRNONAA >60 >60  GFRAA >60 >60     LIVER FUNCTION TESTS: Recent Labs    08/08/19 1037  BILITOT 0.5  AST 36  ALT 24  ALKPHOS 117  PROT 7.2  ALBUMIN 3.8    TUMOR MARKERS: No results for input(s): AFPTM, CEA,  CA199, CHROMGRNA in the last 8760 hours.  Assessment and Plan:  Suspected metastatic disease: Tracy Neal, 64 year old female, presents today to the [facility] department for an image-guided right iliac lesion biopsy. She has a history of breast cancer and recent imaging shows a possible recurrence with metastases.   Risks and benefits were discussed with the patient including, but not limited to bleeding, infection, damage to adjacent structures or low yield requiring additional tests.  All of the questions were answered and there is agreement to proceed.  The patient has been NPO. Vitals have been reviewed.   Consent signed and in chart.   Thank you for this interesting consult.  I greatly enjoyed meeting Tracy Neal and look forward to participating in their care.  A copy of this report was sent to the requesting provider on this date.  Electronically Signed: Soyla Dryer, AGACNP-BC [phone] 09/17/2019, 9:39 AM   I spent a total of  30 Minutes   in face to face in clinical consultation, greater than 50% of which was counseling/coordinating care for right iliac lesion biopsy.

## 2019-09-17 NOTE — Telephone Encounter (Signed)
busy

## 2019-09-17 NOTE — Procedures (Signed)
Interventional Radiology Procedure Note  Procedure: CT Guided Biopsy of right iliac bone lesion  Complications: None  Estimated Blood Loss: < 10 mL  Findings: 18 G core biopsy of destructive right iliac bone lesion performed under CT guidance.  Four core samples obtained and sent to Pathology.  Venetia Night. Kathlene Cote, M.D Pager:  [phone]

## 2019-09-17 NOTE — Discharge Instructions (Addendum)
Needle Biopsy of the Bone, Care After This sheet gives you information about how to care for yourself after your procedure. Your health care provider may also give you more specific instructions. If you have problems or questions, contact your health care provider. What can I expect after the procedure? After the procedure, it is common to have soreness or tenderness at the puncture site. Follow these instructions at home: Puncture care   Follow instructions from your health care provider about how to take care of your puncture site. Make sure you: ? Wash your hands with soap and water before and after you change your bandage (dressing). If soap and water are not available, use hand sanitizer. ? Change your dressing as told by your health care provider.  Check your puncture site every day for signs of infection. Check for: ? Redness, swelling, or worsening pain. ? Fluid or blood. ? Warmth. ? Pus or a bad smell. General instructions  Take over-the-counter and prescription medicines only as told by your health care provider.  Do not drive for 24 hours if you were given a sedative during your procedure.  Return to your normal activities as told by your health care provider.  Keep all follow-up visits as told by your health care provider. This is important. Contact a health care provider if:  You have redness, swelling, or worsening pain at the site of your puncture.  You have fluid or blood coming from your puncture site.  Your puncture site feels warm to the touch.  You have pus or a bad smell coming from your puncture site.  You have a fever.  You have persistent nausea or vomiting. Get help right away if:  You develop a rash.  You have difficulty breathing. Summary  After the procedure, it is common to have soreness or tenderness at the puncture site.  Follow instructions from your health care provider about how to take care of your puncture site.  Contact a health  care provider if you have any signs of infection.  Keep all follow-up visits as told by your health care provider. This is important. This information is not intended to replace advice given to you by your health care provider. Make sure you discuss any questions you have with your health care provider. Document Revised: 09/13/2017 Document Reviewed: 09/13/2017 Elsevier Patient Education  [employer].  Moderate Conscious Sedation, Adult Sedation is the use of medicines to promote relaxation and relieve discomfort and anxiety. Moderate conscious sedation is a type of sedation. Under moderate conscious sedation, you are less alert than normal, but you are still able to respond to instructions, touch, or both. Moderate conscious sedation is used during short medical and dental procedures. It is milder than deep sedation, which is a type of sedation under which you cannot be easily woken up. It is also milder than general anesthesia, which is the use of medicines to make you unconscious. Moderate conscious sedation allows you to return to your regular activities sooner. Tell a health care provider about:  Any allergies you have.  All medicines you are taking, including vitamins, herbs, eye drops, creams, and over-the-counter medicines.  Use of steroids (by mouth or creams).  Any problems you or family members have had with sedatives and anesthetic medicines.  Any blood disorders you have.  Any surgeries you have had.  Any medical conditions you have, such as sleep apnea.  Whether you are pregnant or may be pregnant.  Any use of cigarettes, alcohol,  marijuana, or street drugs. What are the risks? Generally, this is a safe procedure. However, problems may occur, including:  Getting too much medicine (oversedation).  Nausea.  Allergic reaction to medicines.  Trouble breathing. If this happens, a breathing tube may be used to help with breathing. It will be removed when you are  awake and breathing on your own.  Heart trouble.  Lung trouble. What happens before the procedure? Staying hydrated Follow instructions from your health care provider about hydration, which may include:  Up to 2 hours before the procedure - you may continue to drink clear liquids, such as water, clear fruit juice, black coffee, and plain tea. Eating and drinking restrictions Follow instructions from your health care provider about eating and drinking, which may include:  8 hours before the procedure - stop eating heavy meals or foods such as meat, fried foods, or fatty foods.  6 hours before the procedure - stop eating light meals or foods, such as toast or cereal.  6 hours before the procedure - stop drinking milk or drinks that contain milk.  2 hours before the procedure - stop drinking clear liquids. Medicine Ask your health care provider about:  Changing or stopping your regular medicines. This is especially important if you are taking diabetes medicines or blood thinners.  Taking medicines such as aspirin and ibuprofen. These medicines can thin your blood. Do not take these medicines before your procedure if your health care provider instructs you not to.  Tests and exams  You will have a physical exam.  You may have blood tests done to show: ? How well your kidneys and liver are working. ? How well your blood can clot. General instructions  Plan to have someone take you home from the hospital or clinic.  If you will be going home right after the procedure, plan to have someone with you for 24 hours. What happens during the procedure?  An IV tube will be inserted into one of your veins.  Medicine to help you relax (sedative) will be given through the IV tube.  The medical or dental procedure will be performed. What happens after the procedure?  Your blood pressure, heart rate, breathing rate, and blood oxygen level will be monitored often until the medicines you  were given have worn off.  Do not drive for 24 hours. This information is not intended to replace advice given to you by your health care provider. Make sure you discuss any questions you have with your health care provider. Document Revised: 12/15/2016 Document Reviewed: 04/24/2015 Elsevier Patient Education  2020 [employer].

## 2019-09-18 ENCOUNTER — Other Ambulatory Visit: Payer: Self-pay

## 2019-09-19 ENCOUNTER — Ambulatory Visit: Payer: Medicare HMO | Admitting: Licensed Clinical Social Worker

## 2019-09-19 DIAGNOSIS — K219 Gastro-esophageal reflux disease without esophagitis: Secondary | ICD-10-CM

## 2019-09-19 DIAGNOSIS — M5136 Other intervertebral disc degeneration, lumbar region: Secondary | ICD-10-CM

## 2019-09-19 DIAGNOSIS — J439 Emphysema, unspecified: Secondary | ICD-10-CM

## 2019-09-19 DIAGNOSIS — M1711 Unilateral primary osteoarthritis, right knee: Secondary | ICD-10-CM

## 2019-09-19 DIAGNOSIS — F419 Anxiety disorder, unspecified: Secondary | ICD-10-CM

## 2019-09-19 NOTE — Chronic Care Management (AMB) (Signed)
Chronic Care Management    Clinical Social Work Follow Up Note  09/19/2019 Name: Tracy Neal MRN: [id number] DOB: August 02, 1955  Tracy Neal is a 64 y.o. year old female who is a primary care patient of [name], Fransisca Kaufmann, MD. The CCM team was consulted for assistance with [name] .   Review of patient status, including review of consultants reports, other relevant assessments, and collaboration with appropriate care team members and the patient's provider was performed as part of comprehensive patient evaluation and provision of chronic care management services.    SDOH (Social Determinants of Health) assessments performed: No; risk for social isolation; risk for tobacco use; risk for depression; risk for stress; risk for physical inactivity    Chronic Care Management from 06/06/2019 in [facility]  PHQ-9 Total Score 4      GAD 7 : Generalized Anxiety Score 06/06/2019 05/27/2019 03/20/2019  Nervous, Anxious, on Edge 1 3 3   Control/stop worrying 1 3 3   Worry too much - different things 1 3 3   Trouble relaxing 0 3 3  Restless 0 3 2  Easily annoyed or irritable 0 3 3  Afraid - awful might happen 1 3 3   Total GAD 7 Score 4 21 20   Anxiety Difficulty Somewhat difficult - -    Outpatient Encounter Medications as of 09/19/2019  Medication Sig  . albuterol (PROVENTIL) (2.5 MG/3ML) 0.083% nebulizer solution USE 1 VIAL IN NEBULIZER EVERY 6 HOURS AS NEEDED FOR WHEEZING OR SHORTNESS OF BREATH. (Needs to be seen before next refill) (Patient taking differently: Take 2.5 mg by nebulization See admin instructions. Every three hours)  . albuterol (VENTOLIN HFA) 108 (90 Base) MCG/ACT inhaler INHALE 1-2 PUFFS EVERY 4 HOURS AS NEEDED FOR WHEEZING AND FOR SHORTNESS OF BREATH. Needs to be seen before next refill (Patient taking differently: Inhale 2 puffs into the lungs every 6 (six) hours as needed for wheezing or shortness of breath. )  . amitriptyline (ELAVIL) 25  MG tablet Take 1-2 tablets (25-50 mg total) by mouth at bedtime. (Needs to be seen before next refill) (Patient taking differently: Take 25 mg by mouth at bedtime. )  . budesonide-formoterol (SYMBICORT) 160-4.5 MCG/ACT inhaler Inhale 2 puffs into the lungs 2 (two) times daily. Needs to be seen before next refill (Patient taking differently: Inhale 2 puffs into the lungs daily as needed (shortness of breath). )  . Cyanocobalamin (B-12 PO) Take 1,000 mcg by mouth 2 (two) times a week.   . cyclobenzaprine (FLEXERIL) 10 MG tablet Take 1 tablet (10 mg total) by mouth at bedtime. (Patient taking differently: Take 10 mg by mouth 2 (two) times daily. )  . diazepam (VALIUM) 5 MG tablet Take 1 tablet (5 mg total) by mouth every 6 (six) hours as needed for anxiety.  Marland Kitchen esomeprazole (NEXIUM) 40 MG capsule Take 1 capsule (40 mg total) by mouth daily. Needs to be seen before next refill (Patient taking differently: Take 40 mg by mouth daily. )  . famotidine-calcium carbonate-magnesium hydroxide (PEPCID COMPLETE) 10-800-165 MG chewable tablet Chew 2 tablets by mouth daily as needed (heartburn).   . gabapentin (NEURONTIN) 300 MG capsule Take 1 capsule (300 mg total) by mouth 3 (three) times daily.  Marland Kitchen HYDROcodone-acetaminophen (NORCO/VICODIN) 5-325 MG tablet Take 1 tablet by mouth every 6 (six) hours as needed for moderate pain.  . IBU 800 MG tablet TAKE (1) TABLET EVERY EIGHT HOURS AS NEEDED.  . methocarbamol (ROBAXIN) 500 MG tablet Take 1 tablet (500 mg  total) by mouth every 8 (eight) hours as needed for muscle spasms.  Marland Kitchen PARoxetine (PAXIL) 10 MG tablet Take 1 tablet (10 mg total) by mouth daily. Needs to be seen before next refill  . umeclidinium bromide (INCRUSE ELLIPTA) 62.5 MCG/INH AEPB Inhale 1 puff into the lungs daily. Needs to be seen before next refill   No facility-administered encounter medications on file as of 09/19/2019.   LCSW called client phone numbers several times today but LCSW was not able to  speak via phone with client today. Phone message was not set up on client's phones and thus LCSW was not able to leave phone message for client today.    Follow Up Plan:LCSW to call client in next 4 weeks to talk with client about housing needs of client  Tracy Neal.Annaleah Arata MSW, LCSW Licensed Clinical Social Worker [facility] Care Management [phone]

## 2019-09-19 NOTE — Patient Instructions (Addendum)
Licensed Clinical Social Worker Visit Information  Materials Provided: No  09/19/2019  Name: Tracy Neal   MRN: [id number]       DOB: 07/12/1955  [name] is a 64 y.o. year old female who is a primary care patient of [name], Fransisca Kaufmann, MD. The CCM team was consulted for assistance with [name] .   Review of patient status, including review of consultants reports, other relevant assessments, and collaboration with appropriate care team members and the patient's provider was performed as part of comprehensive patient evaluation and provision of chronic care management services.    SDOH (Social Determinants of Health) assessments performed: No; risk for social isolation; risk for tobacco use; risk for depression; risk for stress; risk for physical inactivity  LCSW called client phone numbers several times today but LCSW was not able to speak via phone with client today. Phone message was not set up on client's phones and thus LCSW was not able to leave phone message for client today.    Follow Up Plan:LCSW to call client in next 4 weeks to talk with client about housing needs of client  LCSW was not able to speak via phone with client today; thus the patient was not able to verbalize understanding of instructions provided today and was not able to accept or decline a print copy of patient instruction materials.   Norva Riffle.Aaniya Sterba MSW, LCSW Licensed Clinical Social Worker [facility] Care Management [phone]

## 2019-09-24 ENCOUNTER — Inpatient Hospital Stay (HOSPITAL_COMMUNITY): Payer: Medicare HMO | Attending: Hematology | Admitting: Hematology

## 2019-09-24 ENCOUNTER — Other Ambulatory Visit: Payer: Self-pay

## 2019-09-24 VITALS — BP 128/66 | HR 96 | Temp 96.6°F | Resp 20 | Wt 266.6 lb

## 2019-09-24 DIAGNOSIS — Z170 Estrogen receptor positive status [ER+]: Secondary | ICD-10-CM | POA: Insufficient documentation

## 2019-09-24 DIAGNOSIS — F418 Other specified anxiety disorders: Secondary | ICD-10-CM | POA: Insufficient documentation

## 2019-09-24 DIAGNOSIS — J449 Chronic obstructive pulmonary disease, unspecified: Secondary | ICD-10-CM | POA: Diagnosis not present

## 2019-09-24 DIAGNOSIS — Z7951 Long term (current) use of inhaled steroids: Secondary | ICD-10-CM | POA: Diagnosis not present

## 2019-09-24 DIAGNOSIS — M1990 Unspecified osteoarthritis, unspecified site: Secondary | ICD-10-CM | POA: Insufficient documentation

## 2019-09-24 DIAGNOSIS — R5383 Other fatigue: Secondary | ICD-10-CM | POA: Diagnosis not present

## 2019-09-24 DIAGNOSIS — Z79899 Other long term (current) drug therapy: Secondary | ICD-10-CM | POA: Insufficient documentation

## 2019-09-24 DIAGNOSIS — C7951 Secondary malignant neoplasm of bone: Secondary | ICD-10-CM | POA: Diagnosis not present

## 2019-09-24 DIAGNOSIS — C50919 Malignant neoplasm of unspecified site of unspecified female breast: Secondary | ICD-10-CM | POA: Insufficient documentation

## 2019-09-24 DIAGNOSIS — Z7189 Other specified counseling: Secondary | ICD-10-CM

## 2019-09-24 DIAGNOSIS — R918 Other nonspecific abnormal finding of lung field: Secondary | ICD-10-CM | POA: Diagnosis not present

## 2019-09-24 DIAGNOSIS — E279 Disorder of adrenal gland, unspecified: Secondary | ICD-10-CM | POA: Insufficient documentation

## 2019-09-24 DIAGNOSIS — Z85828 Personal history of other malignant neoplasm of skin: Secondary | ICD-10-CM | POA: Insufficient documentation

## 2019-09-24 DIAGNOSIS — K219 Gastro-esophageal reflux disease without esophagitis: Secondary | ICD-10-CM | POA: Diagnosis not present

## 2019-09-24 DIAGNOSIS — C50111 Malignant neoplasm of central portion of right female breast: Secondary | ICD-10-CM | POA: Diagnosis present

## 2019-09-24 DIAGNOSIS — R200 Anesthesia of skin: Secondary | ICD-10-CM | POA: Insufficient documentation

## 2019-09-24 DIAGNOSIS — Z79811 Long term (current) use of aromatase inhibitors: Secondary | ICD-10-CM | POA: Insufficient documentation

## 2019-09-24 DIAGNOSIS — M810 Age-related osteoporosis without current pathological fracture: Secondary | ICD-10-CM | POA: Insufficient documentation

## 2019-09-24 DIAGNOSIS — C7800 Secondary malignant neoplasm of unspecified lung: Secondary | ICD-10-CM | POA: Insufficient documentation

## 2019-09-24 DIAGNOSIS — F17210 Nicotine dependence, cigarettes, uncomplicated: Secondary | ICD-10-CM | POA: Diagnosis not present

## 2019-09-24 LAB — SURGICAL PATHOLOGY

## 2019-09-24 MED ORDER — PALBOCICLIB 125 MG PO CAPS: 125.0000 mg | ORAL_CAPSULE | Freq: Every day | ORAL | 2 refills | Status: DC

## 2019-09-24 MED ORDER — FULVESTRANT 250 MG/5ML IM SOLN
500.0000 mg | Freq: Once | INTRAMUSCULAR | Status: AC
  Administered 2019-09-24: 500 mg via INTRAMUSCULAR

## 2019-09-24 NOTE — Progress Notes (Signed)
[address], [place] [address]   CLINIC:  Medical Oncology/Hematology  PCP:  [name], Fransisca Kaufmann, MD [place] / [place] [address] [phone]   REASON FOR VISIT:  Follow-up for metastatic right breast cancer  PRIOR THERAPY:  1. Lumpectomy on 07/15/2012. 2. TC x 4 cycles   NGS Results: ER 100%, PR 2%, HER-2 negative  CURRENT THERAPY: Under work-up  BRIEF ONCOLOGIC HISTORY:  Oncology History   No history exists.    CANCER STAGING: Cancer Staging Cancer of central portion of female breast ([place]) Staging form: Breast, AJCC 7th Edition - Clinical: Stage IIA (T2, N0, cM0) - Unsigned - Pathologic: No stage assigned - Unsigned   INTERVAL HISTORY:  Tracy Neal, a 64 y.o. female, returns for routine follow-up of her metastatic breast cancer. Tracy Neal was last seen on 08/18/2019.   Today she is accompanied by her friend. She denies having double vision, but she needs glasses to read. She reports that she took anastrozole for all 5 years when she was initially diagnosed with breast cancer.  She is scheduled to receive her COVID vaccine on Friday, 9/10.   REVIEW OF SYSTEMS:  Review of Systems  Constitutional: Positive for appetite change (moderately decreased) and fatigue (depleted).  Musculoskeletal: Positive for arthralgias (9/10 R leg, knee and buttock pain) and back pain.  Neurological: Positive for numbness (R leg).  Psychiatric/Behavioral: Positive for sleep disturbance.  All other systems reviewed and are negative.   PAST MEDICAL/SURGICAL HISTORY:  Past Medical History:  Diagnosis Date  . Anxiety   . Arthritis   . Basal cell carcinoma    not biopsy confirmed; Right bridge of nose  . Breast cancer ([place])   . COPD (chronic obstructive pulmonary disease) ([place])   . COPD, moderate ([place])   . Depression   . GERD (gastroesophageal reflux disease)   . Shortness of breath dyspnea   . Umbilical hernia    Past  Surgical History:  Procedure Laterality Date  . BREAST LUMPECTOMY Right 07/15/2012   Procedure: BREAST LUMPECTOMY WITH AXILLARY LYMPH NODE BIOPSY;  Surgeon: Harl Bowie, MD;  Location: [facility];  Service: General;  Laterality: Right;  Nuclear medicine injection 9:30   . CHOLECYSTECTOMY    . CHONDROPLASTY  08/07/2019   Procedure: CHONDROPLASTY MEDIAL CONDYAL;  Surgeon: Carole Civil, MD;  Location: [facility];  Service: Orthopedics;;  . COLONOSCOPY N/A 03/16/2015   Procedure: COLONOSCOPY;  Surgeon: Danie Binder, MD;  [facility];  Service: Endoscopy;  Laterality: N/A;  2:30 PM - moved to 12:30 - office to notify pt  . KNEE ARTHROSCOPY WITH MEDIAL MENISECTOMY Right 08/07/2019   Procedure: KNEE ARTHROSCOPY WITH MEDIAL MENISECTOMY;  Surgeon: Carole Civil, MD;  Location: [facility];  Service: Orthopedics;  Laterality: Right;  . TUBAL LIGATION      SOCIAL HISTORY:  Social History   Socioeconomic History  . Marital status: Single    Spouse name: Not on file  . Number of children: 1  . Years of education: Not on file  . Highest education level: Not on file  Occupational History  . Not on file  Tobacco Use  . Smoking status: Current Some Day Smoker    Packs/day: 0.50    Years: 40.00    Pack years: 20.00    Types: Cigarettes  . Smokeless tobacco: Never Used  Vaping Use  . Vaping Use: Never used  Substance and Sexual Activity  . Alcohol  use: No  . Drug use: No  . Sexual activity: Not Currently    Birth control/protection: None  Other Topics Concern  . Not on file  Social History Narrative  . Not on file   Social Determinants of Health   Financial Resource Strain: Low Risk   . Difficulty of Paying Living Expenses: Not hard at all  Food Insecurity: No Food Insecurity  . Worried About Charity fundraiser in the Last Year: Never true  . Ran Out of Food in the Last Year: Never true  Transportation Needs: No Transportation Needs  . Lack of Transportation  (Medical): No  . Lack of Transportation (Non-Medical): No  Physical Activity: Inactive  . Days of Exercise per Week: 0 days  . Minutes of Exercise per Session: 0 min  Stress: Stress Concern Present  . Feeling of Stress : To some extent  Social Connections: Socially Isolated  . Frequency of Communication with Friends and Family: More than three times a week  . Frequency of Social Gatherings with Friends and Family: Once a week  . Attends Religious Services: Never  . Active Member of Clubs or Organizations: No  . Attends Archivist Meetings: Never  . Marital Status: Never married  Intimate Partner Violence: Not At Risk  . Fear of Current or Ex-Partner: No  . Emotionally Abused: No  . Physically Abused: No  . Sexually Abused: No    FAMILY HISTORY:  Family History  Problem Relation Age of Onset  . Heart disease Mother   . Cancer Paternal Aunt   . Heart attack Sister     CURRENT MEDICATIONS:  Current Outpatient Medications  Medication Sig Dispense Refill  . albuterol (PROVENTIL) (2.5 MG/3ML) 0.083% nebulizer solution USE 1 VIAL IN NEBULIZER EVERY 6 HOURS AS NEEDED FOR WHEEZING OR SHORTNESS OF BREATH. (Needs to be seen before next refill) (Patient taking differently: Take 2.5 mg by nebulization See admin instructions. Every three hours) 360 mL 0  . albuterol (VENTOLIN HFA) 108 (90 Base) MCG/ACT inhaler INHALE 1-2 PUFFS EVERY 4 HOURS AS NEEDED FOR WHEEZING AND FOR SHORTNESS OF BREATH. Needs to be seen before next refill (Patient taking differently: Inhale 2 puffs into the lungs every 6 (six) hours as needed for wheezing or shortness of breath. ) 18 g 0  . amitriptyline (ELAVIL) 25 MG tablet Take 1-2 tablets (25-50 mg total) by mouth at bedtime. (Needs to be seen before next refill) (Patient taking differently: Take 25 mg by mouth at bedtime. ) 60 tablet 0  . budesonide-formoterol (SYMBICORT) 160-4.5 MCG/ACT inhaler Inhale 2 puffs into the lungs 2 (two) times daily. Needs to be  seen before next refill (Patient taking differently: Inhale 2 puffs into the lungs daily as needed (shortness of breath). ) 10.2 g 0  . Cyanocobalamin (B-12 PO) Take 1,000 mcg by mouth 2 (two) times a week.     . cyclobenzaprine (FLEXERIL) 10 MG tablet Take 1 tablet (10 mg total) by mouth at bedtime. (Patient taking differently: Take 10 mg by mouth 2 (two) times daily. ) 30 tablet 0  . diazepam (VALIUM) 5 MG tablet Take 1 tablet (5 mg total) by mouth every 6 (six) hours as needed for anxiety. 1 tablet 0  . esomeprazole (NEXIUM) 40 MG capsule Take 1 capsule (40 mg total) by mouth daily. Needs to be seen before next refill (Patient taking differently: Take 40 mg by mouth daily. ) 30 capsule 0  . famotidine-calcium carbonate-magnesium hydroxide (PEPCID COMPLETE) 10-800-165 MG chewable  tablet Chew 2 tablets by mouth daily as needed (heartburn).     . gabapentin (NEURONTIN) 300 MG capsule Take 1 capsule (300 mg total) by mouth 3 (three) times daily. 90 capsule 5  . HYDROcodone-acetaminophen (NORCO/VICODIN) 5-325 MG tablet Take 1 tablet by mouth every 6 (six) hours as needed for moderate pain. 30 tablet 0  . IBU 800 MG tablet TAKE (1) TABLET EVERY EIGHT HOURS AS NEEDED. 60 tablet 0  . methocarbamol (ROBAXIN) 500 MG tablet Take 1 tablet (500 mg total) by mouth every 8 (eight) hours as needed for muscle spasms. 20 tablet 0  . PARoxetine (PAXIL) 10 MG tablet Take 1 tablet (10 mg total) by mouth daily. Needs to be seen before next refill 30 tablet 0  . umeclidinium bromide (INCRUSE ELLIPTA) 62.5 MCG/INH AEPB Inhale 1 puff into the lungs daily. Needs to be seen before next refill 30 each 0   No current facility-administered medications for this visit.    ALLERGIES:  Allergies  Allergen Reactions  . Percocet [Oxycodone-Acetaminophen] Itching and Nausea And Vomiting    Feels like bugs crawling on skin    PHYSICAL EXAM:  Performance status (ECOG): 1 - Symptomatic but completely ambulatory  Vitals:    09/24/19 1102  BP: 128/66  Pulse: 96  Resp: 20  Temp: (!) 96.6 F (35.9 C)  SpO2: 98%   Wt Readings from Last 3 Encounters:  09/24/19 266 lb 9.6 oz (120.9 kg)  09/17/19 270 lb (122.5 kg)  08/24/19 270 lb (122.5 kg)   Physical Exam Vitals reviewed.  Constitutional:      Appearance: Normal appearance. She is obese.  HENT:     Mouth/Throat:     Dentition: Abnormal dentition (missing upper teeth).  Cardiovascular:     Rate and Rhythm: Normal rate and regular rhythm.  Pulmonary:     Effort: Pulmonary effort is normal.     Breath sounds: Normal breath sounds.  Neurological:     General: No focal deficit present.     Mental Status: She is alert and oriented to person, place, and time.  Psychiatric:        Mood and Affect: Mood normal.        Behavior: Behavior normal.      LABORATORY DATA:  I have reviewed the labs as listed.  CBC Latest Ref Rng & Units 09/17/2019 08/08/2019 08/05/2019  WBC 4.0 - 10.5 K/uL 10.6(H) 17.5(H) 13.7(H)  Hemoglobin 12.0 - 15.0 g/dL 14.0 14.2 15.6(H)  Hematocrit 36 - 46 % 44.8 45.2 48.8(H)  Platelets 150 - 400 K/uL 320 316 339   CMP Latest Ref Rng & Units 08/08/2019 08/05/2019 07/30/2017  Glucose 70 - 99 mg/dL 100(H) 90 124(H)  BUN 8 - 23 mg/dL $Remove'20 14 16  'ghAFPmA$ Creatinine 0.44 - 1.00 mg/dL 0.86 0.89 1.08(H)  Sodium 135 - 145 mmol/L 139 137 136  Potassium 3.5 - 5.1 mmol/L 3.8 4.0 4.7  Chloride 98 - 111 mmol/L 103 102 103  CO2 22 - 32 mmol/L 26 21(L) 21(L)  Calcium 8.9 - 10.3 mg/dL 9.4 9.2 9.0  Total Protein 6.5 - 8.1 g/dL 7.2 - -  Total Bilirubin 0.3 - 1.2 mg/dL 0.5 - -  Alkaline Phos 38 - 126 U/L 117 - -  AST 15 - 41 U/L 36 - -  ALT 0 - 44 U/L 24 - -   Surgical pathology ([id number]) on 09/17/2019: Right iliac bone biopsy: metastatic carcinoma with ER 100%, PR 2%, HER-2 negative.  DIAGNOSTIC IMAGING:  I have independently  reviewed the scans and discussed with the patient. DG Lumbar Spine 2-3 Views  Result Date: 09/12/2019 Imaging lumbar spine  sciatica right leg Moderate lumbar scoliosis flattening of the lumbar spine several areas of degenerative disc disease facet arthritis Impression spondylosis and lumbar scoliosis  CT Biopsy  Result Date: 09/17/2019 CLINICAL DATA:  History of breast carcinoma in 2014 and now presenting with multiple bilateral pulmonary nodules, lymphadenopathy in the chest and numerous bone lesions including a destructive predominantly lytic lesion of the right iliac bone with surrounding soft tissue component. The patient presents for biopsy. EXAM: CT GUIDED CORE BIOPSY OF RIGHT ILIAC BONE LESION ANESTHESIA/SEDATION: 2.0 mg IV Versed; 100 mcg IV Fentanyl Total Moderate Sedation Time:  24 minutes. The patient's level of consciousness and physiologic status were continuously monitored during the procedure by Radiology nursing. PROCEDURE: The procedure risks, benefits, and alternatives were explained to the patient. Questions regarding the procedure were encouraged and answered. The patient understands and consents to the procedure. A time-out was performed prior to initiating the procedure. CT was performed in a supine position through the bony pelvis. The right anterior pelvic region was prepped with chlorhexidine in a sterile fashion, and a sterile drape was applied covering the operative field. A sterile gown and sterile gloves were used for the procedure. Local anesthesia was provided with 1% Lidocaine. A 17 gauge trocar needle was advanced to the level of a destructive lesion involving the right iliac bone. After confirming needle tip position, coaxial 18 gauge core biopsy samples were obtained. For core biopsy samples were submitted in formalin. Gel-Foam pledgets were advanced through the outer needle prior to outer needle removal. COMPLICATIONS: None FINDINGS: Large destructive lesion causing pathologic fracture and bony destruction of the right iliac wing was targeted. This includes soft tissue component extending into the  iliacus muscle and also eroding posteriorly through the iliac bone. Solid tissue was obtained. IMPRESSION: CT-guided core biopsy performed of large, destructive right iliac bone lesion. Electronically Signed   By: Aletta Edouard M.D.   On: 09/17/2019 13:37     ASSESSMENT:  1. Metastatic breast cancer to the lungs and bones: -CT angio of the chest on 08/08/2019 shows multiple bilateral lung masses consistent with metastatic disease.  Enlarged pretracheal and right hilar lymph nodes.  1.6 x 1.4 cm isodense left adrenal mass likely consistent with adrenal adenoma. -PET scan on 08/22/2019 showed widespread metastatic disease throughout the mediastinum, hilar lymph nodes, lungs and bones. Small left adrenal meta stasis. No involvement of liver. -Biopsy of the right iliac soft tissue mass on 09/17/2019 consistent with metastatic breast cancer. ER 100%, PR 2%, HER-2 IHC 2+, negative by FISH.  2.  T2N0 right breast IDC: -Lumpectomy and SLNB on 07/15/2012, 3.2 cm IDC, margins negative, 0/2 lymph nodes involved, ER 100%, PR 0%, HER-2 negative, Ki-67 18%. -Oncotype DX score of 28. -Received 4 cycles of TC by Dr. Trinna Balloon in [place].  She also received adjuvant radiation. -She took anastrozole for 2 years and lost to follow-up. -Last mammogram on 03/30/2015 was BI-RADS Category 2. -She does report on and off numbness in the right upper extremity including arm and forearm.  3.  Osteoporosis: -Bone density test on 04/07/2015 shows left femoral neck with a T score of -3.3.  4.  Tobacco use: -She is a current active smoker 1 pack/day for 49 years.  5.  Family history: -Paternal aunt died of "cancer".   PLAN:  1. Metastatic ER positive breast cancer to the lungs and bones: -I  have reviewed images of the PET scan with the patient in detail. -I have also reviewed biopsy results of the right iliac soft tissue mass. -We talked about normal prognosis of metastatic breast cancer. -She does not have any visceral  crisis at this time. She has poor compliance with the aromatase inhibitors and took them only for couple of years at her initial diagnosis. -I have recommended fulvestrant with CDK 4/6 inhibitor palbociclib. -We talked about side effects of palbociclib including cytopenias and fatigue. -She will receive a fulvestrant loading dose today. We will send her prescription to specialty pharmacy. -We will see her back in 3 weeks, approximately 2 weeks after the start of palbociclib with repeat blood counts.  2. Bone metastasis: -She does not have any teeth in the upper jaw. Lower jaw, she has only front teeth. -We talked about the need for starting denosumab. We will discuss in detail and likely initiate in a month.   Orders placed this encounter:  No orders of the defined types were placed in this encounter.  Total time spent is 40 minutes with more than 50% of the time spent face-to-face discussing scan results, pathology results, prognosis, treatment plan, counseling and coordination of care.  Derek Jack, MD [facility] [phone]   I, Milinda Antis, am acting as a scribe for Dr. Sanda Linger.  I, Derek Jack MD, have reviewed the above documentation for accuracy and completeness, and I agree with the above.

## 2019-09-24 NOTE — Patient Instructions (Signed)
[facility] at [facility] Discharge Instructions  You were seen today by Dr. Delton Coombes. He went over your recent results and scans. Dr. Delton Coombes will see you back in 3 weeks for labs and follow up.   Thank you for choosing [facility] to provide your oncology and hematology care.  To afford each patient quality time with our provider, please arrive at least 15 minutes before your scheduled appointment time.   If you have a lab appointment with the [facility] please come in thru the Main Entrance and check in at the main information desk  You need to re-schedule your appointment should you arrive 10 or more minutes late.  We strive to give you quality time with our providers, and arriving late affects you and other patients whose appointments are after yours.  Also, if you no show three or more times for appointments you may be dismissed from the clinic at the providers discretion.     Again, thank you for choosing [facility].  Our hope is that these requests will decrease the amount of time that you wait before being seen by our physicians.       _____________________________________________________________  Should you have questions after your visit to [facility], please contact our office at [phone] between the hours of 8:00 a.m. and 4:30 p.m.  Voicemails left after 4:00 p.m. will not be returned until the following business day.  For prescription refill requests, have your pharmacy contact our office and allow 72 hours.    Cancer Center Support Programs:   > Cancer Support Group  2nd Tuesday of the month 1pm-2pm, Journey Room

## 2019-09-25 ENCOUNTER — Other Ambulatory Visit (HOSPITAL_COMMUNITY): Payer: Self-pay | Admitting: Hematology

## 2019-09-25 ENCOUNTER — Encounter (HOSPITAL_COMMUNITY): Payer: Self-pay

## 2019-09-25 ENCOUNTER — Telehealth (HOSPITAL_COMMUNITY): Payer: Self-pay

## 2019-09-25 ENCOUNTER — Telehealth (HOSPITAL_COMMUNITY): Payer: Self-pay | Admitting: Pharmacist

## 2019-09-25 DIAGNOSIS — C50919 Malignant neoplasm of unspecified site of unspecified female breast: Secondary | ICD-10-CM

## 2019-09-25 MED ORDER — PALBOCICLIB 125 MG PO TABS: 125.0000 mg | ORAL_TABLET | Freq: Every day | ORAL | 2 refills | Status: DC

## 2019-09-25 NOTE — Progress Notes (Signed)
I spoke with this patient concerning her arthralgia. I reassured her that this is a common side effect of the faslodex she received yesterday. Patient was encouraged to take ibuprofen per Lala Lund, NP.

## 2019-09-25 NOTE — Telephone Encounter (Signed)
Oral Oncology Pharmacy Student Encounter  Received new prescription for Ibrance (palbociclib) for the treatment of ER+/PR-, HER2- metastatic breast cancer in conjunction with fulvestrant, planned duration until disease progression or unacceptable drug toxicity.  CBC w/ diff and CMP from 08/08/2019 assessed, grossly unremarkable. Prescription dose and frequency assessed.   Current medication list in Epic reviewed, no DDIs with Ibrance identified.  Evaluated chart and no patient barriers to medication adherence identified.    Prescription has been e-scribed to the [facility] for benefits analysis and approval.  Oral Oncology Clinic will continue to follow for insurance authorization, copayment issues, initial counseling and start date.  Lowella Fairy D Candidate 2022  [facility] Oral Chemotherapy [facility] [phone]  09/25/2019 10:40 AM

## 2019-09-25 NOTE — Telephone Encounter (Signed)
Oral Oncology Pharmacy Student Encounter   Received notification from [facility] that prior authorization for [name] is required.   PA submitted on CMM Key [id number]  Status is pending   Oral Oncology Clinic will continue to follow.   Laurey Arrow, PharmD Candidate 2022 [facility] Oral Chemotherapy Navigation [facility] [phone]  09/25/2019 2:07 PM

## 2019-09-25 NOTE — Progress Notes (Signed)
Attempted to return patient's call twice. Unable to reach patient at this time and no VM box is available.

## 2019-09-26 ENCOUNTER — Ambulatory Visit (INDEPENDENT_AMBULATORY_CARE_PROVIDER_SITE_OTHER): Payer: Medicare HMO | Admitting: *Deleted

## 2019-09-26 DIAGNOSIS — J439 Emphysema, unspecified: Secondary | ICD-10-CM

## 2019-09-26 DIAGNOSIS — F339 Major depressive disorder, recurrent, unspecified: Secondary | ICD-10-CM

## 2019-09-26 DIAGNOSIS — C50919 Malignant neoplasm of unspecified site of unspecified female breast: Secondary | ICD-10-CM

## 2019-09-26 NOTE — Telephone Encounter (Signed)
Oral Oncology Patient Advocate Encounter  Received notification from [facility] that the request for prior authorization for [name] has been denied due to patient not having HER-2 negative disease.  Patient does have HER-2 negative disease and the question was answered incorrectly on CoverMyMeds.    An appeal packet has been faxed to [facility] at [phone].   This encounter will continue to be updated until final determination.    Tracy Neal Patient [place] Phone [phone] Fax [phone] 09/26/2019 12:44 PM

## 2019-09-26 NOTE — Telephone Encounter (Signed)
Oral Oncology Patient Advocate Encounter  Prior Authorization for Tracy Neal has been approved.    PA# [id number] Effective dates: 07/17/19 through 01/16/20  Patients co-pay is $3.73.  Oral Oncology Clinic will continue to follow.   [name] Patient [name] Phone [phone] Fax [phone] 09/26/2019 3:52 PM

## 2019-09-27 ENCOUNTER — Other Ambulatory Visit: Payer: Self-pay | Admitting: Family Medicine

## 2019-09-27 DIAGNOSIS — K219 Gastro-esophageal reflux disease without esophagitis: Secondary | ICD-10-CM

## 2019-09-29 ENCOUNTER — Ambulatory Visit (HOSPITAL_COMMUNITY): Payer: Medicare HMO

## 2019-09-29 ENCOUNTER — Other Ambulatory Visit: Payer: Self-pay | Admitting: *Deleted

## 2019-09-29 NOTE — Telephone Encounter (Signed)
NA

## 2019-09-29 NOTE — Telephone Encounter (Signed)
[name]. NTBS 30 days given 08/12/19

## 2019-09-30 ENCOUNTER — Telehealth: Payer: Self-pay | Admitting: Orthopedic Surgery

## 2019-09-30 DIAGNOSIS — M4726 Other spondylosis with radiculopathy, lumbar region: Secondary | ICD-10-CM

## 2019-09-30 DIAGNOSIS — M1711 Unilateral primary osteoarthritis, right knee: Secondary | ICD-10-CM

## 2019-09-30 NOTE — Telephone Encounter (Signed)
Patient called to request a prescription for a wheelchair; states she is having difficulty getting around. Aware of her follow up appointment scheduled 10/09/19. Please advise.

## 2019-10-01 ENCOUNTER — Other Ambulatory Visit: Payer: Self-pay | Admitting: Family Medicine

## 2019-10-01 ENCOUNTER — Encounter: Payer: Self-pay | Admitting: Orthopedic Surgery

## 2019-10-01 ENCOUNTER — Ambulatory Visit (HOSPITAL_COMMUNITY): Payer: Medicare HMO

## 2019-10-01 DIAGNOSIS — K219 Gastro-esophageal reflux disease without esophagitis: Secondary | ICD-10-CM

## 2019-10-01 MED FILL — IBRANCE 125 MG TABS: 125 | 28 days supply | Qty: 21 | Fill #0

## 2019-10-01 NOTE — Progress Notes (Unsigned)
she needs wheelchair for knee and back, and she is able to safely self propel the wheelchair and a walker / cane will not give adequate mobility support.

## 2019-10-01 NOTE — Telephone Encounter (Signed)
I prepared order, will remind you when she is offiice next visit. Medicare will need you to state in office visit note she needs wheelchair for knee and back, and she is able to safely self propel the wheelchair and a walker / cane will not give adequate mobility support.   Order is on your desk.

## 2019-10-01 NOTE — Telephone Encounter (Signed)
Called patient order ready No answer I mailed order to her.

## 2019-10-01 NOTE — Telephone Encounter (Signed)
Oral Chemotherapy Pharmacist Encounter  [facility] will deliver medication to Ms. Ehlert on 10/02/19. She knows to get started once she has the medication in hand.  Patient Education I spoke with patient for overview of new oral chemotherapy medication: Ibrance (palbociclib) for the treatment of ER+/PR-, HER2- metastatic breast cancer in conjunction with fulvestrant, planned duration until disease progression or unacceptable drug toxicity.   Pt is doing well. Counseled patient on administration, dosing, side effects, monitoring, drug-food interactions, safe handling, storage, and disposal. Patient will take 1 tablet (125 mg total) by mouth daily. Take for 21 days on, 7 days off, repeat every 28 days..  Side effects include but not limited to: decreased wbc/hgb/plt, fatigue, N/V.    Reviewed with patient importance of keeping a medication schedule and plan for any missed doses.  After discussion with patient one patient barriers to medication adherence identified. It was hard to get the patient on the phone from initial medication fill. This might lead to a problem with medication refills, reinforced the refill process and the importance and answering calls from the pharmacy. [place] will attempt to call the patient 3 times, then let the clinic know if they are unable to reach the patient.  Ms. [place] voiced understanding and appreciation. All questions answered. Medication handout placed in the mail.  Provided patient with [facility] phone number. Patient knows to call the office with questions or concerns. Oral Chemotherapy Navigation Clinic will continue to follow.  Darl Pikes, PharmD, BCPS, BCOP, CPP Hematology/Oncology Clinical Pharmacist Practitioner [facility]/HP/AP [facility] [phone]  10/01/2019 12:05 PM

## 2019-10-01 NOTE — Telephone Encounter (Signed)
ok 

## 2019-10-01 NOTE — Telephone Encounter (Signed)
Oral Oncology Patient Advocate Encounter  I spoke with Mr Heldman today to set up delivery of Ibrance.  Address verified for shipment.  [name] will be filled through [facility] and mailed 10/01/19 for delivery 10/02/19.    [place] will call 7-10 days before next refill is due to complete adherence call and set up delivery of medication.     [place] Patient [place] Phone [phone] Fax [phone] 10/01/2019 11:47 AM

## 2019-10-02 ENCOUNTER — Encounter: Payer: Self-pay | Admitting: Family Medicine

## 2019-10-02 ENCOUNTER — Other Ambulatory Visit: Payer: Self-pay | Admitting: Family Medicine

## 2019-10-02 ENCOUNTER — Other Ambulatory Visit: Payer: Self-pay

## 2019-10-02 ENCOUNTER — Ambulatory Visit (INDEPENDENT_AMBULATORY_CARE_PROVIDER_SITE_OTHER): Payer: Medicare HMO | Admitting: Family Medicine

## 2019-10-02 VITALS — BP 144/87 | HR 79 | Temp 98.6°F | Ht 67.0 in | Wt 266.0 lb

## 2019-10-02 DIAGNOSIS — M5431 Sciatica, right side: Secondary | ICD-10-CM

## 2019-10-02 DIAGNOSIS — K219 Gastro-esophageal reflux disease without esophagitis: Secondary | ICD-10-CM

## 2019-10-02 MED ORDER — ESOMEPRAZOLE MAGNESIUM 40 MG PO CPDR: 40.0000 mg | DELAYED_RELEASE_CAPSULE | Freq: Every day | ORAL | 3 refills | Status: DC

## 2019-10-02 MED ORDER — FAMOTIDINE-CA CARB-MAG HYDROX 10-800-165 MG PO CHEW
2.0000 | CHEWABLE_TABLET | Freq: Every day | ORAL | 1 refills | Status: DC | PRN
Start: 2019-10-02 — End: 2020-10-04

## 2019-10-02 MED ORDER — METHYLPREDNISOLONE ACETATE 80 MG/ML IJ SUSP: 80.0000 mg | Freq: Once | INTRAMUSCULAR | Status: DC

## 2019-10-02 NOTE — Progress Notes (Signed)
BP (!) 144/87   Pulse 79   Temp 98.6 F (37 C)   Ht 5\' 7"  (1.702 m)   Wt 266 lb (120.7 kg)   SpO2 97%   BMI 41.66 kg/m    Subjective:   Patient ID: Tracy Neal, female    DOB: 1955-05-30, 64 y.o.   MRN: [id number]  HPI: Tracy Neal is a 64 y.o. female presenting on 10/02/2019 for Sciatica pain (right sided. Flared for 2 weeks.)   HPI Patient is coming in with complaints of right-sided lower back pain and sciatic pain going down into her right upper leg.  She says that this is been going on for couple weeks.  She says she was recently diagnosed with metastatic breast cancer spread throughout her body and stage IV and they are working on possible treatment.  She is going to start [place] soon.  She says his back pain has been bothering her more over the past couple weeks and she does need something to help and wants to get a steroid injection for it.  Relevant past medical, surgical, family and social history reviewed and updated as indicated. Interim medical history since our last visit reviewed. Allergies and medications reviewed and updated.  Review of Systems  Constitutional: Negative for chills and fever.  Eyes: Negative for redness and visual disturbance.  Respiratory: Negative for chest tightness and shortness of breath.   Cardiovascular: Negative for chest pain and leg swelling.  Musculoskeletal: Positive for arthralgias and myalgias. Negative for back pain and gait problem.  Skin: Negative for rash.  Neurological: Negative for light-headedness and headaches.  Psychiatric/Behavioral: Negative for agitation and behavioral problems.  All other systems reviewed and are negative.   Per HPI unless specifically indicated above   Allergies as of 10/02/2019      Reactions   Percocet [oxycodone-acetaminophen] Itching, Nausea And Vomiting   Feels like bugs crawling on skin      Medication List       Accurate as of October 02, 2019 10:20 AM. If you have any  questions, ask your nurse or doctor.        albuterol (2.5 MG/3ML) 0.083% nebulizer solution Commonly known as: PROVENTIL USE 1 VIAL IN NEBULIZER EVERY 6 HOURS AS NEEDED FOR WHEEZING OR SHORTNESS OF BREATH. (Needs to be seen before next refill) What changed:   how much to take  how to take this  when to take this  additional instructions   albuterol 108 (90 Base) MCG/ACT inhaler Commonly known as: VENTOLIN HFA INHALE 1-2 PUFFS EVERY 4 HOURS AS NEEDED FOR WHEEZING AND FOR SHORTNESS OF BREATH. Needs to be seen before next refill What changed:   how much to take  how to take this  when to take this  reasons to take this  additional instructions   amitriptyline 25 MG tablet Commonly known as: ELAVIL Take 1-2 tablets (25-50 mg total) by mouth at bedtime. (Needs to be seen before next refill) What changed:   how much to take  additional instructions   B-12 PO Take 1,000 mcg by mouth 2 (two) times a week.   budesonide-formoterol 160-4.5 MCG/ACT inhaler Commonly known as: Symbicort Inhale 2 puffs into the lungs 2 (two) times daily. Needs to be seen before next refill What changed:   when to take this  reasons to take this  additional instructions   cyclobenzaprine 10 MG tablet Commonly known as: FLEXERIL Take 1 tablet (10 mg total) by mouth at bedtime. What changed:  when to take this   diazepam 5 MG tablet Commonly known as: VALIUM Take 1 tablet (5 mg total) by mouth every 6 (six) hours as needed for anxiety.   esomeprazole 40 MG capsule Commonly known as: NEXIUM TAKE 1 CAPSULE BY MOUTH ONCE DAILY.   famotidine-calcium carbonate-magnesium hydroxide 10-800-165 MG chewable tablet Commonly known as: PEPCID COMPLETE Chew 2 tablets by mouth daily as needed (heartburn).   gabapentin 300 MG capsule Commonly known as: NEURONTIN Take 1 capsule (300 mg total) by mouth 3 (three) times daily.   HYDROcodone-acetaminophen 5-325 MG tablet Commonly known as:  NORCO/VICODIN Take 1 tablet by mouth every 6 (six) hours as needed for moderate pain.   IBU 800 MG tablet Generic drug: ibuprofen TAKE (1) TABLET EVERY EIGHT HOURS AS NEEDED.   Incruse Ellipta 62.5 MCG/INH Aepb Generic drug: umeclidinium bromide Inhale 1 puff into the lungs daily. Needs to be seen before next refill   methocarbamol 500 MG tablet Commonly known as: ROBAXIN Take 1 tablet (500 mg total) by mouth every 8 (eight) hours as needed for muscle spasms.   palbociclib 125 MG tablet Commonly known as: Ibrance Take 1 tablet (125 mg total) by mouth daily. Take for 21 days on, 7 days off, repeat every 28 days.   PARoxetine 10 MG tablet Commonly known as: PAXIL Take 1 tablet (10 mg total) by mouth daily. Needs to be seen before next refill        Objective:   BP (!) 144/87   Pulse 79   Temp 98.6 F (37 C)   Ht 5\' 7"  (1.702 m)   Wt 266 lb (120.7 kg)   SpO2 97%   BMI 41.66 kg/m   Wt Readings from Last 3 Encounters:  10/02/19 266 lb (120.7 kg)  09/24/19 266 lb 9.6 oz (120.9 kg)  09/17/19 270 lb (122.5 kg)    Physical Exam Vitals and nursing note reviewed.  Constitutional:      General: She is not in acute distress.    Appearance: She is well-developed. She is not diaphoretic.  Eyes:     Conjunctiva/sclera: Conjunctivae normal.  Cardiovascular:     Rate and Rhythm: Normal rate and regular rhythm.     Heart sounds: Normal heart sounds. No murmur heard.   Pulmonary:     Effort: Pulmonary effort is normal. No respiratory distress.     Breath sounds: Normal breath sounds. No wheezing.  Skin:    General: Skin is warm and dry.     Findings: No rash.  Neurological:     Mental Status: She is alert and oriented to person, place, and time.     Coordination: Coordination normal.  Psychiatric:        Behavior: Behavior normal.       Assessment & Plan:   Problem List Items Addressed This Visit      Digestive   GERD (gastroesophageal reflux disease)    Relevant Medications   famotidine-calcium carbonate-magnesium hydroxide (PEPCID COMPLETE) 10-800-165 MG chewable tablet   esomeprazole (NEXIUM) 40 MG capsule    Other Visit Diagnoses    Right sided sciatica    -  Primary   Relevant Medications   methylPREDNISolone acetate (DEPO-MEDROL) injection 80 mg (Start on 10/02/2019 10:45 AM)      Will give refills of medication to help her, will also give her a steroid injection to see if it helps her sciatic pain Follow up plan: Return if symptoms worsen or fail to improve.  Counseling provided for all  of the vaccine components No orders of the defined types were placed in this encounter.   Caryl Pina, MD [facility] 10/02/2019, 10:20 AM

## 2019-10-03 MED ORDER — DICLOFENAC SODIUM 1 % EX GEL: 2.0000 g | Freq: Four times a day (QID) | CUTANEOUS | 3 refills | Status: DC

## 2019-10-03 NOTE — Patient Instructions (Signed)
Visit Information  Goals Addressed            This Visit's Progress   . Chronic Disease Management Needs       CARE PLAN ENTRY (see longtitudinal plan of care for additional care plan information)  Current Barriers:  . Chronic Disease Management support, education, and care coordination needs related to GERD, DDD, OA, Osteoporosis, COPD, Anxiety and Depression,Osteopenia,Breast Cancer  Clinical Goal(s) related to GERD, DDD, OA, Osteoporosis, COPD, Anxiety and Depression,Osteopenia,Breast Cancer:  Over the next 30 days, patient will:  . Work with the care management team to address educational, disease management, and care coordination needs  . Call care management team with questions or concerns . Verbalize basic understanding of patient centered plan of care established today  Interventions related to GERD, DDD, OA, Osteoporosis, COPD, Anxiety and Depression,Osteopenia,Breast Cancer:  . Evaluation of current treatment plans and patient's adherence to plan as established by provider . Assessed patient understanding of disease states . Assessed patient's education and care coordination needs . Provided disease specific education to patient  . Collaborated with appropriate clinical care team members regarding patient needs . Medications reviewed . Chart reviewed . Reviewed upcoming appointments . Encouraged patient to talk with LCSW regarding psychosocial issues . Provided with RN Care Manager contact number and encouraged to reach out as needed  Patient Self Care Activities related to GERD, DDD, OA, Osteoporosis, COPD, Anxiety and Depression,Osteopenia,Breast Cancer:  . Patient is unable to independently self-manage chronic health conditions  Initial goal documentation        Ms. Jahn was given information about Chronic Care Management services today including:  1. CCM service includes personalized support from designated clinical staff supervised by her physician,  including individualized plan of care and coordination with other care providers 2. 24/7 contact phone numbers for assistance for urgent and routine care needs. 3. Service will only be billed when office clinical staff spend 20 minutes or more in a month to coordinate care. 4. Only one practitioner may furnish and bill the service in a calendar month. 5. The patient may stop CCM services at any time (effective at the end of the month) by phone call to the office staff. 6. The patient will be responsible for cost sharing (co-pay) of up to 20% of the service fee (after annual deductible is met).  Patient agreed to services and verbal consent obtained.   Patient verbalizes understanding of instructions provided today.   The care management team will reach out to the patient again over the next 30 days.   Chong Sicilian, BSN, RN-BC Embedded Chronic Care Manager [facility] / [place] Management Direct Dial: [phone]

## 2019-10-03 NOTE — Chronic Care Management (AMB) (Signed)
Chronic Care Management   Initial Visit Note  09/26/2019 Name: Tracy Neal MRN: [id number] DOB: September 18, 1955  Referred by: [name], Fransisca Kaufmann, MD Reason for referral : Chronic Care Management (Initial Visit)   Tracy Neal is a 64 y.o. year old female who is a primary care patient of [name], Fransisca Kaufmann, MD. The CCM team was consulted for assistance with chronic disease management and care coordination needs related to GERD, DDD, OA, Osteoporosis, COPD, Anxiety and Depression,Osteopenia,Breast Cancer   Review of patient status, including review of consultants reports, relevant laboratory and other test results, and collaboration with appropriate care team members and the patient's provider was performed as part of comprehensive patient evaluation and provision of chronic care management services.    SDOH (Social Determinants of Health) assessments performed: Yes See Care Plan activities for detailed interventions related to SDOH     Medications: Outpatient Encounter Medications as of 09/26/2019  Medication Sig   albuterol (PROVENTIL) (2.5 MG/3ML) 0.083% nebulizer solution USE 1 VIAL IN NEBULIZER EVERY 6 HOURS AS NEEDED FOR WHEEZING OR SHORTNESS OF BREATH. (Needs to be seen before next refill) (Patient taking differently: Take 2.5 mg by nebulization See admin instructions. Every three hours)   albuterol (VENTOLIN HFA) 108 (90 Base) MCG/ACT inhaler INHALE 1-2 PUFFS EVERY 4 HOURS AS NEEDED FOR WHEEZING AND FOR SHORTNESS OF BREATH. Needs to be seen before next refill (Patient taking differently: Inhale 2 puffs into the lungs every 6 (six) hours as needed for wheezing or shortness of breath. )   amitriptyline (ELAVIL) 25 MG tablet Take 1-2 tablets (25-50 mg total) by mouth at bedtime. (Needs to be seen before next refill) (Patient taking differently: Take 25 mg by mouth at bedtime. )   budesonide-formoterol (SYMBICORT) 160-4.5 MCG/ACT inhaler Inhale 2 puffs into the lungs 2 (two)  times daily. Needs to be seen before next refill (Patient taking differently: Inhale 2 puffs into the lungs daily as needed (shortness of breath). )   Cyanocobalamin (B-12 PO) Take 1,000 mcg by mouth 2 (two) times a week.    cyclobenzaprine (FLEXERIL) 10 MG tablet Take 1 tablet (10 mg total) by mouth at bedtime. (Patient taking differently: Take 10 mg by mouth 2 (two) times daily. )   diazepam (VALIUM) 5 MG tablet Take 1 tablet (5 mg total) by mouth every 6 (six) hours as needed for anxiety.   gabapentin (NEURONTIN) 300 MG capsule Take 1 capsule (300 mg total) by mouth 3 (three) times daily.   HYDROcodone-acetaminophen (NORCO/VICODIN) 5-325 MG tablet Take 1 tablet by mouth every 6 (six) hours as needed for moderate pain.   IBU 800 MG tablet TAKE (1) TABLET EVERY EIGHT HOURS AS NEEDED.   methocarbamol (ROBAXIN) 500 MG tablet Take 1 tablet (500 mg total) by mouth every 8 (eight) hours as needed for muscle spasms.   palbociclib (IBRANCE) 125 MG tablet Take 1 tablet (125 mg total) by mouth daily. Take for 21 days on, 7 days off, repeat every 28 days.   PARoxetine (PAXIL) 10 MG tablet Take 1 tablet (10 mg total) by mouth daily. Needs to be seen before next refill   umeclidinium bromide (INCRUSE ELLIPTA) 62.5 MCG/INH AEPB Inhale 1 puff into the lungs daily. Needs to be seen before next refill   [DISCONTINUED] esomeprazole (NEXIUM) 40 MG capsule Take 1 capsule (40 mg total) by mouth daily. Needs to be seen before next refill (Patient taking differently: Take 40 mg by mouth daily. )   [DISCONTINUED] famotidine-calcium carbonate-magnesium hydroxide (PEPCID COMPLETE)  10-800-165 MG chewable tablet Chew 2 tablets by mouth daily as needed (heartburn).    No facility-administered encounter medications on file as of 09/26/2019.    BP Readings from Last 3 Encounters:  10/02/19 (!) 144/87  09/24/19 128/66  09/17/19 (!) 167/86   Lab Results  Component Value Date   CHOL 161 06/09/2016   HDL 53  06/09/2016   LDLCALC 77 06/09/2016   TRIG 153 (H) 06/09/2016   CHOLHDL 3.0 06/09/2016   Wt Readings from Last 3 Encounters:  10/02/19 266 lb (120.7 kg)  09/24/19 266 lb 9.6 oz (120.9 kg)  09/17/19 270 lb (122.5 kg)     RN Care Plan: Goals Addressed            This Visit's Progress    Chronic Disease Management Needs       CARE PLAN ENTRY (see longtitudinal plan of care for additional care plan information)  Current Barriers:   Chronic Disease Management support, education, and care coordination needs related to GERD, DDD, OA, Osteoporosis, COPD, Anxiety and Depression,Osteopenia,Breast Cancer  Clinical Goal(s) related to GERD, DDD, OA, Osteoporosis, COPD, Anxiety and Depression,Osteopenia,Breast Cancer:  Over the next 30 days, patient will:   Work with the care management team to address educational, disease management, and care coordination needs   Call care management team with questions or concerns  Verbalize basic understanding of patient centered plan of care established today  Interventions related to GERD, DDD, OA, Osteoporosis, COPD, Anxiety and Depression,Osteopenia,Breast Cancer:   Evaluation of current treatment plans and patient's adherence to plan as established by provider  Assessed patient understanding of disease states  Assessed patient's education and care coordination needs  Provided disease specific education to patient   Collaborated with appropriate clinical care team members regarding patient needs  Medications reviewed  Chart reviewed  Reviewed upcoming appointments  Encouraged patient to talk with LCSW regarding psychosocial issues  Provided with RN Care Manager contact number and encouraged to reach out as needed  Patient Self Care Activities related to GERD, DDD, OA, Osteoporosis, COPD, Anxiety and Depression,Osteopenia,Breast Cancer:   Patient is unable to independently self-manage chronic health conditions  Initial goal  documentation          Plan:   The care management team will reach out to the patient again over the next 30 days.   Chong Sicilian, BSN, RN-BC Embedded Chronic Care Manager [facility] / [place] Management Direct Dial: [phone]

## 2019-10-03 NOTE — Telephone Encounter (Signed)
Patient aware and verbalized understanding. °

## 2019-10-03 NOTE — Telephone Encounter (Signed)
Patient states he was also suppose to send in IBU 800 MG tablet it never got sent in will you refill?

## 2019-10-03 NOTE — Telephone Encounter (Signed)
Sent Voltaren gel for her

## 2019-10-07 ENCOUNTER — Other Ambulatory Visit: Payer: Self-pay

## 2019-10-07 ENCOUNTER — Ambulatory Visit (HOSPITAL_COMMUNITY)
Admission: RE | Admit: 2019-10-07 | Discharge: 2019-10-07 | Disposition: A | Discharge status: Home / Self Care | Payer: Medicare HMO | Source: Ambulatory Visit | Attending: Orthopedic Surgery | Admitting: Orthopedic Surgery

## 2019-10-07 DIAGNOSIS — M5431 Sciatica, right side: Secondary | ICD-10-CM | POA: Insufficient documentation

## 2019-10-08 ENCOUNTER — Telehealth: Payer: Self-pay | Admitting: Family Medicine

## 2019-10-08 ENCOUNTER — Other Ambulatory Visit (HOSPITAL_COMMUNITY): Payer: Medicare HMO

## 2019-10-08 ENCOUNTER — Ambulatory Visit (HOSPITAL_COMMUNITY): Payer: Medicare HMO

## 2019-10-08 ENCOUNTER — Ambulatory Visit (HOSPITAL_COMMUNITY): Payer: Medicare HMO | Admitting: Hematology

## 2019-10-08 MED ORDER — ONDANSETRON 8 MG PO TBDP: 8.0000 mg | ORAL_TABLET | Freq: Three times a day (TID) | ORAL | 3 refills | Status: DC | PRN

## 2019-10-08 NOTE — Telephone Encounter (Signed)
Pt wants a refill on HYDROcodone-acetaminophen (NORCO/VICODIN) 5-325 MG tablet. She said that [name] told her that if she ever needed anything for her to call him. She is a stage 4 cancer patient. I told her the policy of no med refill on pain medication without ov. She said just to send [name] a message first and see what he says. Please call back.     Prescription Request  10/08/2019  What is the name of the medication or equipment? HYDROcodone-acetaminophen (NORCO/VICODIN) 5-325 MG tablet  Have you contacted your pharmacy to request a refill? (if applicable) no  Which pharmacy would you like this sent to? [facility]    Patient notified that their request is being sent to the clinical staff for review and that they should receive a response within 2 business days.

## 2019-10-09 ENCOUNTER — Ambulatory Visit: Payer: Medicare HMO | Admitting: Orthopedic Surgery

## 2019-10-09 NOTE — Telephone Encounter (Signed)
Yes I am more than happy to accommodate although it does have to be in a visit.  Please schedule her for a visit

## 2019-10-09 NOTE — Progress Notes (Deleted)
Postop appointment  Status post MRI lumbar spine  Chief Complaint  Patient presents with  . Routine Post Op      08/07/19 right knee  . Gait Problem      cant walk has been to hospital x 64     64 year old female had knee arthroscopy on the right on July 22 went back to the ER on the 23rd with shortness of breath at which time she was found to have 2 nodules which needed further work-up she was back in the ER again on 9 August with sciatic nerve irritation was placed on a Dosepak Norco and Robaxin comes in with severe right leg pain and inability to bear weight on her right leg  MRI IMPRESSION: Multifocal osseous metastases predominantly involving the dorsal vertebral bodies and posterior spinal elements. No pathologic fracture.   Additional metastases involve the bilateral iliac wings and sacrum.   Heterogeneity of the epidural fat at the L4-S2 levels is concerning for extension of tumor versus reactive inflammatory changes. Consider postcontrast MRI for further evaluation.   Multilevel spondylosis. Mild L3-5 spinal canal and bilateral neural foraminal narrowing.   Mild left L2-3 neural foraminal narrowing.   Inferiorly migrated left L4-5 paracentral extrusion abutting the left L5 nerve root.     Electronically Signed   By: Primitivo Gauze M.D.   On: 10/08/2019 08:49

## 2019-10-09 NOTE — Telephone Encounter (Signed)
Pt is scheduled for 10/17/19

## 2019-10-13 ENCOUNTER — Ambulatory Visit (INDEPENDENT_AMBULATORY_CARE_PROVIDER_SITE_OTHER): Payer: Medicare HMO | Admitting: Orthopedic Surgery

## 2019-10-13 ENCOUNTER — Other Ambulatory Visit: Payer: Self-pay

## 2019-10-13 ENCOUNTER — Inpatient Hospital Stay (HOSPITAL_COMMUNITY): Payer: Medicare HMO

## 2019-10-13 ENCOUNTER — Encounter (HOSPITAL_COMMUNITY): Payer: Self-pay

## 2019-10-13 ENCOUNTER — Telehealth: Payer: Self-pay | Admitting: Orthopedic Surgery

## 2019-10-13 ENCOUNTER — Ambulatory Visit: Payer: Medicare HMO | Admitting: Orthopedic Surgery

## 2019-10-13 VITALS — BP 132/74 | HR 69 | Temp 97.0°F | Resp 18

## 2019-10-13 DIAGNOSIS — C7951 Secondary malignant neoplasm of bone: Secondary | ICD-10-CM

## 2019-10-13 DIAGNOSIS — M545 Low back pain: Secondary | ICD-10-CM | POA: Diagnosis not present

## 2019-10-13 DIAGNOSIS — C50111 Malignant neoplasm of central portion of right female breast: Secondary | ICD-10-CM | POA: Diagnosis not present

## 2019-10-13 DIAGNOSIS — C50919 Malignant neoplasm of unspecified site of unspecified female breast: Secondary | ICD-10-CM

## 2019-10-13 MED ORDER — FULVESTRANT 250 MG/5ML IM SOLN
500.0000 mg | Freq: Once | INTRAMUSCULAR | Status: AC
  Administered 2019-10-13: 500 mg via INTRAMUSCULAR
  Filled 2019-10-13: qty 10

## 2019-10-13 NOTE — Telephone Encounter (Signed)
Patient had an appointment conflict today and again was unable to come in for review of MRI results (also was to bring notes for wheelchair). May patient receive results by phone?

## 2019-10-13 NOTE — Telephone Encounter (Signed)
Set up telephone visit for this afternoon

## 2019-10-13 NOTE — Progress Notes (Signed)
Tracy Neal presents today for injection per MD orders. Faslodex 500 mg IM administered in bilateral buttocks Administration without incident. Patient tolerated well.   Vitals stable and discharged home from clinic via wheelchair in stable condition. Follow up as scheduled.

## 2019-10-13 NOTE — Progress Notes (Addendum)
Virtual telephone visit The patient cannot come in for office visit virtual visit was set up.  Patient identified herself and gave her appropriate identifying markers for the visit.    Chief complaint  MRI follow-up  Back and leg pain after knee arthroscopy  I saw the MRI she has metastasis  She also has possible extension of the tumor in the L4-S2 region versus inflammatory changes  I have sent a note to Dr. Zigmund Daniel at the hospital to advise neurosurgical referral  Patient says she is having a lot of pain and having trouble walking  As far as her wheelchair goes  The beneficiary has a mobility limitation that significantly impairs her ability to participate in 1 or more mobility related activities of daily living such as toileting feeding dressing grooming and bathing patient can self propel manual wheelchair in the home  Patient will follow up with oncology   CLINICAL DATA:  Low back pain  EXAM: MRI LUMBAR SPINE WITHOUT CONTRAST  TECHNIQUE: Multiplanar, multisequence MR imaging of the lumbar spine was performed. No intravenous contrast was administered.  COMPARISON:  09/12/2019 lumbar spine radiographs and prior.  FINDINGS: Segmentation:  Standard.  Alignment:  Straightening of lordosis.  No listhesis.  Vertebrae: Heterogenous abnormal bone marrow signal involving the posterior spinal elements and spinous processes spanning the T12-S1 level. Similar signal abnormalities are seen within the posterior L3-L5 vertebral bodies and sacrum.  Diffusely abnormal marrow signal involving the right greater than left iliac wings and bilateral sacral ala.  No pathologic fracture.  Vertebral body heights are preserved.  Conus medullaris and cauda equina: Conus extends to the L2 level. Conus and cauda equina appear normal.  Disc levels: Multilevel desiccation and mild disc space loss most prominent at the L4-5 and L5-S1 levels.  L1-2: Mild disc bulge, ligamentum  flavum and bilateral facet hypertrophy. Patent spinal canal and neural foramen.  L2-3: Mild disc bulge, ligamentum flavum and bilateral facet hypertrophy. Tiny superimposed left subarticular protrusion. Patent spinal canal and right neural foramen. Mild left neural foraminal narrowing.  L3-4: Disc bulge, ligamentum flavum and bilateral facet hypertrophy. Mild spinal canal and bilateral neural foraminal narrowing.  L4-5: Disc bulge with superimposed inferiorly migrated left paracentral extrusion abutting the left L5 nerve root and effacing the lateral recess. Mild spinal canal and bilateral neural foraminal narrowing.  L5-S1: Disc bulge with superimposed central protrusion bilateral facet hypertrophy. Patent spinal canal and neural foramen.  Prominence and heterogeneity of the anterior epidural fat spanning the L4-S2 levels.  Paraspinal and other soft tissues: Negative.  IMPRESSION: Multifocal osseous metastases predominantly involving the dorsal vertebral bodies and posterior spinal elements. No pathologic fracture.  Additional metastases involve the bilateral iliac wings and sacrum.  Heterogeneity of the epidural fat at the L4-S2 levels is concerning for extension of tumor versus reactive inflammatory changes. Consider postcontrast MRI for further evaluation.  Multilevel spondylosis. Mild L3-5 spinal canal and bilateral neural foraminal narrowing.  Mild left L2-3 neural foraminal narrowing.  Inferiorly migrated left L4-5 paracentral extrusion abutting the left L5 nerve root.   Electronically Signed   By: Primitivo Gauze M.D.   On: 10/08/2019 08:49  11:20 AM 11/24/2019 Long, Jae Dire, LPN  Carole Civil, MD Mallory Shirk Owens Shark [[id number]] DOS 10/13/2019   Good morning Dr. Aline Brochure, The Coding manager from [place], Graceann Congress sent me an email asking me to get you to addend your note on this patient to include the following below.  Thanks, Tammy    The visit is documented as  a phone visit could you ask him to include time and location of the patient and provider.    Patient located at home provider located in the office.  Time spent 10 minutes estimated  Thanks.      Graceann Congress MBA, [facility], [place], [place]  SW-Professional Environmental manager

## 2019-10-13 NOTE — Telephone Encounter (Signed)
Notified patient.

## 2019-10-13 NOTE — Patient Instructions (Signed)
[facility] at [facility]  Discharge Instructions:   _______________________________________________________________  Thank you for choosing [facility] at [facility] to provide your oncology and hematology care.  To afford each patient quality time with our providers, please arrive at least 15 minutes before your scheduled appointment.  You need to re-schedule your appointment if you arrive 10 or more minutes late.  We strive to give you quality time with our providers, and arriving late affects you and other patients whose appointments are after yours.  Also, if you no show three or more times for appointments you may be dismissed from the clinic.  Again, thank you for choosing [facility] at [facility]. Our hope is that these requests will allow you access to exceptional care and in a timely manner. _______________________________________________________________  If you have questions after your visit, please contact our office at [phone] between the hours of 8:30 a.m. and 5:00 p.m. Voicemails left after 4:30 p.m. will not be returned until the following business day. _______________________________________________________________  For prescription refill requests, have your pharmacy contact our office. _______________________________________________________________  Recommendations made by the consultant and any test results will be sent to your referring physician. _______________________________________________________________ 

## 2019-10-14 ENCOUNTER — Other Ambulatory Visit (HOSPITAL_COMMUNITY): Payer: Self-pay | Admitting: *Deleted

## 2019-10-14 ENCOUNTER — Other Ambulatory Visit: Payer: Self-pay | Admitting: Family Medicine

## 2019-10-14 DIAGNOSIS — C50919 Malignant neoplasm of unspecified site of unspecified female breast: Secondary | ICD-10-CM

## 2019-10-14 MED ORDER — DIAZEPAM 5 MG PO TABS: ORAL_TABLET | ORAL | 0 refills | Status: DC

## 2019-10-14 NOTE — Progress Notes (Signed)
Orders placed for MR lumbar spine per Dr. Delton Coombes. Attempted to call patient with information regarding the scan. No answer and no voicemail

## 2019-10-15 ENCOUNTER — Other Ambulatory Visit: Payer: Self-pay | Admitting: Family Medicine

## 2019-10-17 ENCOUNTER — Ambulatory Visit: Payer: Medicare HMO | Admitting: Family Medicine

## 2019-10-22 ENCOUNTER — Encounter: Payer: Self-pay | Admitting: Family Medicine

## 2019-10-22 ENCOUNTER — Ambulatory Visit (HOSPITAL_COMMUNITY): Payer: Medicare HMO

## 2019-10-22 ENCOUNTER — Ambulatory Visit (HOSPITAL_COMMUNITY): Payer: Medicare HMO | Admitting: Hematology

## 2019-10-22 ENCOUNTER — Other Ambulatory Visit (HOSPITAL_COMMUNITY): Payer: Medicare HMO

## 2019-10-23 ENCOUNTER — Ambulatory Visit (HOSPITAL_COMMUNITY): Admission: RE | Admit: 2019-10-23 | Payer: Medicare HMO | Source: Ambulatory Visit

## 2019-10-24 ENCOUNTER — Ambulatory Visit: Payer: Medicare HMO | Admitting: Licensed Clinical Social Worker

## 2019-10-24 DIAGNOSIS — F419 Anxiety disorder, unspecified: Secondary | ICD-10-CM

## 2019-10-24 DIAGNOSIS — F32A Depression, unspecified: Secondary | ICD-10-CM

## 2019-10-24 DIAGNOSIS — M5136 Other intervertebral disc degeneration, lumbar region: Secondary | ICD-10-CM

## 2019-10-24 DIAGNOSIS — M51369 Other intervertebral disc degeneration, lumbar region without mention of lumbar back pain or lower extremity pain: Secondary | ICD-10-CM

## 2019-10-24 DIAGNOSIS — C50919 Malignant neoplasm of unspecified site of unspecified female breast: Secondary | ICD-10-CM

## 2019-10-24 DIAGNOSIS — M1711 Unilateral primary osteoarthritis, right knee: Secondary | ICD-10-CM

## 2019-10-24 DIAGNOSIS — J439 Emphysema, unspecified: Secondary | ICD-10-CM

## 2019-10-24 DIAGNOSIS — K219 Gastro-esophageal reflux disease without esophagitis: Secondary | ICD-10-CM

## 2019-10-24 NOTE — Chronic Care Management (AMB) (Signed)
Chronic Care Management    Clinical Social Work Follow Up Note  10/24/2019 Name: Tracy Neal MRN: [id number] DOB: 12-28-1955  Tracy Neal is a 64 y.o. year old female who is a primary care patient of [name], Fransisca Kaufmann, MD. The CCM team was consulted for assistance with [name] .   Review of patient status, including review of consultants reports, other relevant assessments, and collaboration with appropriate care team members and the patient's provider was performed as part of comprehensive patient evaluation and provision of chronic care management services.    SDOH (Social Determinants of Health) assessments performed: No;risk for depression; risk for stress; risk for social isolation; risk for tobacco use; risk for physical inactivity    Chronic Care Management from 06/06/2019 in [place]  PHQ-9 Total Score 4     GAD 7 : Generalized Anxiety Score 06/06/2019 05/27/2019 03/20/2019  Nervous, Anxious, on Edge 1 3 3   Control/stop worrying 1 3 3   Worry too much - different things 1 3 3   Trouble relaxing 0 3 3  Restless 0 3 2  Easily annoyed or irritable 0 3 3  Afraid - awful might happen 1 3 3   Total GAD 7 Score 4 21 20   Anxiety Difficulty Somewhat difficult - -    Outpatient Encounter Medications as of 10/24/2019  Medication Sig  . albuterol (PROVENTIL) (2.5 MG/3ML) 0.083% nebulizer solution USE 1 VIAL IN NEBULIZER EVERY 6 HOURS AS NEEDED FOR WHEEZING OR SHORTNESS OF BREATH. (Needs to be seen before next refill) (Patient taking differently: Take 2.5 mg by nebulization See admin instructions. Every three hours)  . albuterol (VENTOLIN HFA) 108 (90 Base) MCG/ACT inhaler INHALE 1-2 PUFFS EVERY 4 HOURS AS NEEDED FOR WHEEZING AND FOR SHORTNESS OF BREATH. Needs to be seen before next refill (Patient taking differently: Inhale 2 puffs into the lungs every 6 (six) hours as needed for wheezing or shortness of breath. )  . amitriptyline (ELAVIL) 25  MG tablet Take 1-2 tablets (25-50 mg total) by mouth at bedtime. (Needs to be seen before next refill) (Patient taking differently: Take 25 mg by mouth at bedtime. )  . budesonide-formoterol (SYMBICORT) 160-4.5 MCG/ACT inhaler Inhale 2 puffs into the lungs 2 (two) times daily. Needs to be seen before next refill (Patient taking differently: Inhale 2 puffs into the lungs daily as needed (shortness of breath). )  . Cyanocobalamin (B-12 PO) Take 1,000 mcg by mouth 2 (two) times a week.   . cyclobenzaprine (FLEXERIL) 10 MG tablet Take 1 tablet (10 mg total) by mouth at bedtime. (Patient taking differently: Take 10 mg by mouth 2 (two) times daily. )  . diazepam (VALIUM) 5 MG tablet Take one hour prior to MRI.  Marland Kitchen diclofenac Sodium (VOLTAREN) 1 % GEL Apply 2 g topically 4 (four) times daily.  Marland Kitchen esomeprazole (NEXIUM) 40 MG capsule Take 1 capsule (40 mg total) by mouth daily.  . famotidine-calcium carbonate-magnesium hydroxide (PEPCID COMPLETE) 10-800-165 MG chewable tablet Chew 2 tablets by mouth daily as needed (heartburn).  . gabapentin (NEURONTIN) 300 MG capsule Take 1 capsule (300 mg total) by mouth 3 (three) times daily.  Marland Kitchen HYDROcodone-acetaminophen (NORCO/VICODIN) 5-325 MG tablet Take 1 tablet by mouth every 6 (six) hours as needed for moderate pain.  . IBU 800 MG tablet TAKE (1) TABLET EVERY EIGHT HOURS AS NEEDED.  . methocarbamol (ROBAXIN) 500 MG tablet Take 1 tablet (500 mg total) by mouth every 8 (eight) hours as needed for muscle spasms.  Marland Kitchen  ondansetron (ZOFRAN ODT) 8 MG disintegrating tablet Take 1 tablet (8 mg total) by mouth every 8 (eight) hours as needed for nausea or vomiting.  . palbociclib (IBRANCE) 125 MG tablet Take 1 tablet (125 mg total) by mouth daily. Take for 21 days on, 7 days off, repeat every 28 days.  Marland Kitchen PARoxetine (PAXIL) 10 MG tablet Take 1 tablet (10 mg total) by mouth daily. Needs to be seen before next refill  . umeclidinium bromide (INCRUSE ELLIPTA) 62.5 MCG/INH AEPB Inhale 1  puff into the lungs daily. Needs to be seen before next refill   Facility-Administered Encounter Medications as of 10/24/2019  Medication  . methylPREDNISolone acetate (DEPO-MEDROL) injection 80 mg     LCSW called client home phone number several times today but did not speak via phone with client; phone message was not set up on client's phone and thus, LCSW was not able to leave client message today. LCSW also called phone number for client daughter but was not able to speak with her daughter via phone. LCSW did leave phone message for daughter of client requesting daughter of client to please call LCSW at [phone]  Follow Up Plan: LCSW to call client/daughter  in next 4 weeks to talk with client or daughter about housing needs of client  Norva Riffle.Ainslie Mazurek MSW, LCSW Licensed Clinical Social Worker [facility] Care Management [phone]

## 2019-10-24 NOTE — Patient Instructions (Addendum)
Licensed Clinical Social Worker Visit Information  Materials Provided: No  10/24/2019  Name: Tracy Neal   MRN: [id number]       DOB: 01-Dec-1955  Tracy Neal is a 64 y.o. year old female who is a primary care patient of [name], Fransisca Kaufmann, MD. The CCM team was consulted for assistance with [name] .   Review of patient status, including review of consultants reports, other relevant assessments, and collaboration with appropriate care team members and the patient's provider was performed as part of comprehensive patient evaluation and provision of chronic care management services.    SDOH (Social Determinants of Health) assessments performed: No;risk for depression; risk for stress; risk for social isolation; risk for tobacco use; risk for physical inactivity  LCSW called client home phone number several times today but did not speak via phone with client; phone message was not set up on client's phone and thus, LCSW was not able to leave client message today. LCSW also called phone number for client daughter but was not able to speak with her daughter via phone. LCSW did leave phone message for daughter of client requesting daughter of client to please call LCSW at [phone]  Follow Up Plan: LCSW to call client/daughter  in next 4 weeks to talk with client or daughter about housing needs of client  LCSW was not able to speak via phone today with client or her daughter; thus, the client or her  daughter were not able to verbalize understanding of instructions provided today and were not able to accept or decline a print copy of patient instruction materials.   Tracy Neal.Tracy Neal MSW, LCSW Licensed Clinical Social Worker [facility] Care Management [phone]

## 2019-10-26 NOTE — Progress Notes (Deleted)
[place], [place] [address]   CLINIC:  Medical Oncology/Hematology  PCP:  [name], Fransisca Kaufmann, MD [place] / [place] [address] [phone]   REASON FOR VISIT:  Follow-up for metastatic right breast cancer  PRIOR THERAPY:  1. Lumpectomy on 07/15/2012. 2. TC x 4 cycles   NGS Results: ER 100%, PR 2%, HER-2 negative  CURRENT THERAPY: Under work-up  BRIEF ONCOLOGIC HISTORY:  Oncology History   No history exists.    CANCER STAGING: Cancer Staging Cancer of central portion of female breast ([place]) Staging form: Breast, AJCC 7th Edition - Clinical: Stage IIA (T2, N0, cM0) - Unsigned - Pathologic: No stage assigned - Unsigned   INTERVAL HISTORY:  [name], a 64 y.o. female, returns for routine follow-up of her metastatic breast cancer. Tracy Neal was last seen on 08/18/2019.   Today she is accompanied by her friend. She denies having double vision, but she needs glasses to read. She reports that she took anastrozole for all 5 years when she was initially diagnosed with breast cancer.  She is scheduled to receive her COVID vaccine on Friday, 9/10.   REVIEW OF SYSTEMS:  Review of Systems  Constitutional: Positive for appetite change (moderately decreased) and fatigue (depleted).  Musculoskeletal: Positive for arthralgias (9/10 R leg, knee and buttock pain) and back pain.  Neurological: Positive for numbness (R leg).  Psychiatric/Behavioral: Positive for sleep disturbance.  All other systems reviewed and are negative.   PAST MEDICAL/SURGICAL HISTORY:  Past Medical History:  Diagnosis Date  . Anxiety   . Arthritis   . Basal cell carcinoma    not biopsy confirmed; Right bridge of nose  . Breast cancer ([place])   . COPD (chronic obstructive pulmonary disease) ([place])   . COPD, moderate ([place])   . Depression   . GERD (gastroesophageal reflux disease)   . Shortness of breath dyspnea   . Umbilical hernia    Past  Surgical History:  Procedure Laterality Date  . BREAST LUMPECTOMY Right 07/15/2012   Procedure: BREAST LUMPECTOMY WITH AXILLARY LYMPH NODE BIOPSY;  Surgeon: Harl Bowie, MD;  Location: [facility];  Service: General;  Laterality: Right;  Nuclear medicine injection 9:30   . CHOLECYSTECTOMY    . CHONDROPLASTY  08/07/2019   Procedure: CHONDROPLASTY MEDIAL CONDYAL;  Surgeon: Carole Civil, MD;  Location: [facility];  Service: Orthopedics;;  . COLONOSCOPY N/A 03/16/2015   Procedure: COLONOSCOPY;  Surgeon: Danie Binder, MD;  [facility];  Service: Endoscopy;  Laterality: N/A;  2:30 PM - moved to 12:30 - office to notify pt  . KNEE ARTHROSCOPY WITH MEDIAL MENISECTOMY Right 08/07/2019   Procedure: KNEE ARTHROSCOPY WITH MEDIAL MENISECTOMY;  Surgeon: Carole Civil, MD;  Location: [facility];  Service: Orthopedics;  Laterality: Right;  . TUBAL LIGATION      SOCIAL HISTORY:  Social History   Socioeconomic History  . Marital status: Single    Spouse name: Not on file  . Number of children: 1  . Years of education: Not on file  . Highest education level: Not on file  Occupational History  . Not on file  Tobacco Use  . Smoking status: Current Some Day Smoker    Packs/day: 0.50    Years: 40.00    Pack years: 20.00    Types: Cigarettes  . Smokeless tobacco: Never Used  Vaping Use  . Vaping Use: Never used  Substance and Sexual Activity  . Alcohol  use: No  . Drug use: No  . Sexual activity: Not Currently    Birth control/protection: None  Other Topics Concern  . Not on file  Social History Narrative  . Not on file   Social Determinants of Health   Financial Resource Strain: Low Risk   . Difficulty of Paying Living Expenses: Not hard at all  Food Insecurity: No Food Insecurity  . Worried About Charity fundraiser in the Last Year: Never true  . Ran Out of Food in the Last Year: Never true  Transportation Needs: No Transportation Needs  . Lack of Transportation  (Medical): No  . Lack of Transportation (Non-Medical): No  Physical Activity: Inactive  . Days of Exercise per Week: 0 days  . Minutes of Exercise per Session: 0 min  Stress: Stress Concern Present  . Feeling of Stress : To some extent  Social Connections: Socially Isolated  . Frequency of Communication with Friends and Family: More than three times a week  . Frequency of Social Gatherings with Friends and Family: Once a week  . Attends Religious Services: Never  . Active Member of Clubs or Organizations: No  . Attends Archivist Meetings: Never  . Marital Status: Never married  Intimate Partner Violence: Not At Risk  . Fear of Current or Ex-Partner: No  . Emotionally Abused: No  . Physically Abused: No  . Sexually Abused: No    FAMILY HISTORY:  Family History  Problem Relation Age of Onset  . Heart disease Mother   . Cancer Paternal Aunt   . Heart attack Sister     CURRENT MEDICATIONS:  Current Outpatient Medications  Medication Sig Dispense Refill  . albuterol (PROVENTIL) (2.5 MG/3ML) 0.083% nebulizer solution USE 1 VIAL IN NEBULIZER EVERY 6 HOURS AS NEEDED FOR WHEEZING OR SHORTNESS OF BREATH. (Needs to be seen before next refill) (Patient taking differently: Take 2.5 mg by nebulization See admin instructions. Every three hours) 360 mL 0  . albuterol (VENTOLIN HFA) 108 (90 Base) MCG/ACT inhaler INHALE 1-2 PUFFS EVERY 4 HOURS AS NEEDED FOR WHEEZING AND FOR SHORTNESS OF BREATH. Needs to be seen before next refill (Patient taking differently: Inhale 2 puffs into the lungs every 6 (six) hours as needed for wheezing or shortness of breath. ) 18 g 0  . amitriptyline (ELAVIL) 25 MG tablet Take 1-2 tablets (25-50 mg total) by mouth at bedtime. (Needs to be seen before next refill) (Patient taking differently: Take 25 mg by mouth at bedtime. ) 60 tablet 0  . budesonide-formoterol (SYMBICORT) 160-4.5 MCG/ACT inhaler Inhale 2 puffs into the lungs 2 (two) times daily. Needs to be  seen before next refill (Patient taking differently: Inhale 2 puffs into the lungs daily as needed (shortness of breath). ) 10.2 g 0  . Cyanocobalamin (B-12 PO) Take 1,000 mcg by mouth 2 (two) times a week.     . cyclobenzaprine (FLEXERIL) 10 MG tablet Take 1 tablet (10 mg total) by mouth at bedtime. (Patient taking differently: Take 10 mg by mouth 2 (two) times daily. ) 30 tablet 0  . diazepam (VALIUM) 5 MG tablet Take one hour prior to MRI. 1 tablet 0  . diclofenac Sodium (VOLTAREN) 1 % GEL Apply 2 g topically 4 (four) times daily. 350 g 3  . esomeprazole (NEXIUM) 40 MG capsule Take 1 capsule (40 mg total) by mouth daily. 90 capsule 3  . famotidine-calcium carbonate-magnesium hydroxide (PEPCID COMPLETE) 10-800-165 MG chewable tablet Chew 2 tablets by mouth daily as  needed (heartburn). 60 tablet 1  . gabapentin (NEURONTIN) 300 MG capsule Take 1 capsule (300 mg total) by mouth 3 (three) times daily. 90 capsule 5  . HYDROcodone-acetaminophen (NORCO/VICODIN) 5-325 MG tablet Take 1 tablet by mouth every 6 (six) hours as needed for moderate pain. 30 tablet 0  . IBU 800 MG tablet TAKE (1) TABLET EVERY EIGHT HOURS AS NEEDED. 60 tablet 0  . methocarbamol (ROBAXIN) 500 MG tablet Take 1 tablet (500 mg total) by mouth every 8 (eight) hours as needed for muscle spasms. 20 tablet 0  . ondansetron (ZOFRAN ODT) 8 MG disintegrating tablet Take 1 tablet (8 mg total) by mouth every 8 (eight) hours as needed for nausea or vomiting. 20 tablet 3  . palbociclib (IBRANCE) 125 MG tablet Take 1 tablet (125 mg total) by mouth daily. Take for 21 days on, 7 days off, repeat every 28 days. 21 tablet 2  . PARoxetine (PAXIL) 10 MG tablet Take 1 tablet (10 mg total) by mouth daily. Needs to be seen before next refill 30 tablet 0  . umeclidinium bromide (INCRUSE ELLIPTA) 62.5 MCG/INH AEPB Inhale 1 puff into the lungs daily. Needs to be seen before next refill 30 each 0   Current Facility-Administered Medications  Medication Dose  Route Frequency Provider Last Rate Last Admin  . methylPREDNISolone acetate (DEPO-MEDROL) injection 80 mg  80 mg Intramuscular Once [name], Fransisca Kaufmann, MD        ALLERGIES:  Allergies  Allergen Reactions  . Percocet [Oxycodone-Acetaminophen] Itching and Nausea And Vomiting    Feels like bugs crawling on skin    PHYSICAL EXAM:  Performance status (ECOG): 1 - Symptomatic but completely ambulatory  There were no vitals filed for this visit. Wt Readings from Last 3 Encounters:  10/02/19 266 lb (120.7 kg)  09/24/19 266 lb 9.6 oz (120.9 kg)  09/17/19 270 lb (122.5 kg)   Physical Exam Vitals reviewed.  Constitutional:      Appearance: Normal appearance. She is obese.  HENT:     Mouth/Throat:     Dentition: Abnormal dentition (missing upper teeth).  Cardiovascular:     Rate and Rhythm: Normal rate and regular rhythm.  Pulmonary:     Effort: Pulmonary effort is normal.     Breath sounds: Normal breath sounds.  Neurological:     General: No focal deficit present.     Mental Status: She is alert and oriented to person, place, and time.  Psychiatric:        Mood and Affect: Mood normal.        Behavior: Behavior normal.      LABORATORY DATA:  I have reviewed the labs as listed.  CBC Latest Ref Rng & Units 09/17/2019 08/08/2019 08/05/2019  WBC 4.0 - 10.5 K/uL 10.6(H) 17.5(H) 13.7(H)  Hemoglobin 12.0 - 15.0 g/dL 14.0 14.2 15.6(H)  Hematocrit 36 - 46 % 44.8 45.2 48.8(H)  Platelets 150 - 400 K/uL 320 316 339   CMP Latest Ref Rng & Units 08/08/2019 08/05/2019 07/30/2017  Glucose 70 - 99 mg/dL 100(H) 90 124(H)  BUN 8 - 23 mg/dL _0 Creatinine 0.44 - 1.00 mg/dL 0.86 0.89 1.08(H)  Sodium 135 - 145 mmol/L 139 137 136  Potassium 3.5 - 5.1 mmol/L 3.8 4.0 4.7  Chloride 98 - 111 mmol/L 103 102 103  CO2 22 - 32 mmol/L 26 21(L) 21(L)  Calcium 8.9 - 10.3 mg/dL 9.4 9.2 9.0  Total Protein 6.5 - 8.1 g/dL 7.2 - -  Total Bilirubin  0.3 - 1.2 mg/dL 0.5 - -  Alkaline Phos 38 - 126 U/L 117 - -   AST 15 - 41 U/L 36 - -  ALT 0 - 44 U/L 24 - -   Surgical pathology ([id number]) on 09/17/2019: Right iliac bone biopsy: metastatic carcinoma with ER 100%, PR 2%, HER-2 negative.  DIAGNOSTIC IMAGING:  I have independently reviewed the scans and discussed with the patient. MR Lumbar Spine Wo Contrast  Result Date: 10/08/2019 CLINICAL DATA:  Low back pain EXAM: MRI LUMBAR SPINE WITHOUT CONTRAST TECHNIQUE: Multiplanar, multisequence MR imaging of the lumbar spine was performed. No intravenous contrast was administered. COMPARISON:  09/12/2019 lumbar spine radiographs and prior. FINDINGS: Segmentation:  Standard. Alignment:  Straightening of lordosis.  No listhesis. Vertebrae: Heterogenous abnormal bone marrow signal involving the posterior spinal elements and spinous processes spanning the T12-S1 level. Similar signal abnormalities are seen within the posterior L3-L5 vertebral bodies and sacrum. Diffusely abnormal marrow signal involving the right greater than left iliac wings and bilateral sacral ala. No pathologic fracture.  Vertebral body heights are preserved. Conus medullaris and cauda equina: Conus extends to the L2 level. Conus and cauda equina appear normal. Disc levels: Multilevel desiccation and mild disc space loss most prominent at the L4-5 and L5-S1 levels. L1-2: Mild disc bulge, ligamentum flavum and bilateral facet hypertrophy. Patent spinal canal and neural foramen. L2-3: Mild disc bulge, ligamentum flavum and bilateral facet hypertrophy. Tiny superimposed left subarticular protrusion. Patent spinal canal and right neural foramen. Mild left neural foraminal narrowing. L3-4: Disc bulge, ligamentum flavum and bilateral facet hypertrophy. Mild spinal canal and bilateral neural foraminal narrowing. L4-5: Disc bulge with superimposed inferiorly migrated left paracentral extrusion abutting the left L5 nerve root and effacing the lateral recess. Mild spinal canal and bilateral neural foraminal  narrowing. L5-S1: Disc bulge with superimposed central protrusion bilateral facet hypertrophy. Patent spinal canal and neural foramen. Prominence and heterogeneity of the anterior epidural fat spanning the L4-S2 levels. Paraspinal and other soft tissues: Negative. IMPRESSION: Multifocal osseous metastases predominantly involving the dorsal vertebral bodies and posterior spinal elements. No pathologic fracture. Additional metastases involve the bilateral iliac wings and sacrum. Heterogeneity of the epidural fat at the L4-S2 levels is concerning for extension of tumor versus reactive inflammatory changes. Consider postcontrast MRI for further evaluation. Multilevel spondylosis. Mild L3-5 spinal canal and bilateral neural foraminal narrowing. Mild left L2-3 neural foraminal narrowing. Inferiorly migrated left L4-5 paracentral extrusion abutting the left L5 nerve root. Electronically Signed   By: Primitivo Gauze M.D.   On: 10/08/2019 08:49     ASSESSMENT:  1. Metastatic breast cancer to the lungs and bones: -CT angio of the chest on 08/08/2019 shows multiple bilateral lung masses consistent with metastatic disease.  Enlarged pretracheal and right hilar lymph nodes.  1.6 x 1.4 cm isodense left adrenal mass likely consistent with adrenal adenoma. -PET scan on 08/22/2019 showed widespread metastatic disease throughout the mediastinum, hilar lymph nodes, lungs and bones. Small left adrenal meta stasis. No involvement of liver. -Biopsy of the right iliac soft tissue mass on 09/17/2019 consistent with metastatic breast cancer. ER 100%, PR 2%, HER-2 IHC 2+, negative by FISH.  2.  T2N0 right breast IDC: -Lumpectomy and SLNB on 07/15/2012, 3.2 cm IDC, margins negative, 0/2 lymph nodes involved, ER 100%, PR 0%, HER-2 negative, Ki-67 18%. -Oncotype DX score of 28. -Received 4 cycles of TC by Dr. Trinna Balloon in [place].  She also received adjuvant radiation. -She took anastrozole for 2 years and lost to follow-up. -  Last  mammogram on 03/30/2015 was BI-RADS Category 2. -She does report on and off numbness in the right upper extremity including arm and forearm.  3.  Osteoporosis: -Bone density test on 04/07/2015 shows left femoral neck with a T score of -3.3.  4.  Tobacco use: -She is a current active smoker 1 pack/day for 49 years.  5.  Family history: -Paternal aunt died of "cancer".   PLAN:  1. Metastatic ER positive breast cancer to the lungs and bones: -I have reviewed images of the PET scan with the patient in detail. -I have also reviewed biopsy results of the right iliac soft tissue mass. -We talked about normal prognosis of metastatic breast cancer. -She does not have any visceral crisis at this time. She has poor compliance with the aromatase inhibitors and took them only for couple of years at her initial diagnosis. -I have recommended fulvestrant with CDK 4/6 inhibitor palbociclib. -We talked about side effects of palbociclib including cytopenias and fatigue. -She will receive a fulvestrant loading dose today. We will send her prescription to specialty pharmacy. -We will see her back in 3 weeks, approximately 2 weeks after the start of palbociclib with repeat blood counts.  2. Bone metastasis: -She does not have any teeth in the upper jaw. Lower jaw, she has only front teeth. -We talked about the need for starting denosumab. We will discuss in detail and likely initiate in a month.   Orders placed this encounter:  No orders of the defined types were placed in this encounter.  Total time spent is 40 minutes with more than 50% of the time spent face-to-face discussing scan results, pathology results, prognosis, treatment plan, counseling and coordination of care.  Derek Jack, MD [place] [phone]   I, Milinda Antis, am acting as a scribe for Dr. Sanda Linger.  I, Derek Jack MD, have reviewed the above documentation for accuracy and  completeness, and I agree with the above.

## 2019-10-27 ENCOUNTER — Inpatient Hospital Stay (HOSPITAL_COMMUNITY): Payer: Medicare HMO

## 2019-10-27 ENCOUNTER — Inpatient Hospital Stay (HOSPITAL_COMMUNITY): Payer: Medicare HMO | Admitting: Oncology

## 2019-10-27 ENCOUNTER — Telehealth: Payer: Self-pay

## 2019-10-27 ENCOUNTER — Inpatient Hospital Stay (HOSPITAL_COMMUNITY): Payer: Medicare HMO | Attending: Hematology

## 2019-10-28 ENCOUNTER — Telehealth: Payer: Self-pay

## 2019-10-28 ENCOUNTER — Telehealth: Payer: Self-pay | Admitting: Orthopedic Surgery

## 2019-10-28 NOTE — Telephone Encounter (Signed)
Yes please have them let me know what is going on with their health. Caryl Pina, MD [facility] 10/28/2019, 7:59 AM

## 2019-10-28 NOTE — Telephone Encounter (Signed)
If she cannot walk since the surgery then I do think she needs to go back and see the surgeon, if she does want to come discuss pain management we could discuss gust that in a visit or possibly discuss a referral to pain management

## 2019-10-28 NOTE — Telephone Encounter (Signed)
Call received from [place], [place], requesting notes and demographics for order received from patient for wheelchair (order is in patient's Epic chart) They will fax a request as well so that we may directly respond to it. Their ph# is [phone], and fax # [phone].

## 2019-10-28 NOTE — Telephone Encounter (Signed)
Thanks can you fax ? He put details in his last visit / phone encounter

## 2019-10-28 NOTE — Telephone Encounter (Signed)
Sending it on (our fax from them is not coming through per [place], Myriam Jacobson, after multiple attempts to re-fax. Relayed we are aware of fax issue with incoming faxes; sending it to fax #[phone].

## 2019-10-29 ENCOUNTER — Other Ambulatory Visit: Payer: Self-pay | Admitting: Family Medicine

## 2019-10-29 ENCOUNTER — Other Ambulatory Visit (HOSPITAL_COMMUNITY): Payer: Self-pay | Admitting: Hematology

## 2019-10-29 NOTE — Telephone Encounter (Signed)
Lmtcb.

## 2019-10-30 ENCOUNTER — Telehealth: Payer: Self-pay | Admitting: *Deleted

## 2019-10-30 NOTE — Telephone Encounter (Signed)
PA came in for Diclofenac gel   Key: [id number] Sent to plan

## 2019-10-30 NOTE — Telephone Encounter (Signed)
Approved today Your request has been approved [facility] aware

## 2019-11-10 ENCOUNTER — Other Ambulatory Visit: Payer: Self-pay | Admitting: Family Medicine

## 2019-11-10 DIAGNOSIS — K219 Gastro-esophageal reflux disease without esophagitis: Secondary | ICD-10-CM

## 2019-11-12 ENCOUNTER — Other Ambulatory Visit: Payer: Self-pay | Admitting: Family Medicine

## 2019-11-12 ENCOUNTER — Other Ambulatory Visit (HOSPITAL_COMMUNITY): Payer: Self-pay

## 2019-11-12 ENCOUNTER — Other Ambulatory Visit (HOSPITAL_COMMUNITY): Payer: Self-pay | Admitting: Hematology

## 2019-11-12 DIAGNOSIS — J439 Emphysema, unspecified: Secondary | ICD-10-CM

## 2019-11-12 DIAGNOSIS — F419 Anxiety disorder, unspecified: Secondary | ICD-10-CM

## 2019-11-12 DIAGNOSIS — F32A Depression, unspecified: Secondary | ICD-10-CM

## 2019-11-12 MED ORDER — DIAZEPAM 5 MG PO TABS
ORAL_TABLET | ORAL | 0 refills | Status: DC
Start: 2019-11-12 — End: 2020-09-30

## 2019-11-12 MED FILL — IBRANCE 125 MG TABS: 125 | 28 days supply | Qty: 21 | Fill #1

## 2019-11-13 ENCOUNTER — Ambulatory Visit (INDEPENDENT_AMBULATORY_CARE_PROVIDER_SITE_OTHER): Payer: Medicare HMO | Admitting: Family Medicine

## 2019-11-13 ENCOUNTER — Encounter: Payer: Self-pay | Admitting: Family Medicine

## 2019-11-13 DIAGNOSIS — H60331 Swimmer's ear, right ear: Secondary | ICD-10-CM | POA: Diagnosis not present

## 2019-11-13 MED ORDER — NEOMYCIN-POLYMYXIN-HC 3.5-10000-1 OT SOLN: 3.0000 [drp] | Freq: Four times a day (QID) | OTIC | 0 refills | Status: DC

## 2019-11-13 NOTE — Progress Notes (Signed)
Virtual Visit via telephone Note  I connected with Tracy Neal on 11/13/19 at 1653 by telephone and verified that I am speaking with the correct person using two identifiers. Tracy Neal is currently located at home and patient are currently with her during visit. The provider, Fransisca Kaufmann Ronika Kelson, MD is located in their office at time of visit.  Call ended at 1704  I discussed the limitations, risks, security and privacy concerns of performing an evaluation and management service by telephone and the availability of in person appointments. I also discussed with the patient that there may be a patient responsible charge related to this service. The patient expressed understanding and agreed to proceed.   History and Present Illness: Patient is calling in for ear ache that started the past couple days and was hurting worse yesterday and is slightly better today.  She had a headache but is better.  This is her right ear that hurts.  She has no cough or congestion or wheezing.   No diagnosis found.  Outpatient Encounter Medications as of 11/13/2019  Medication Sig  . albuterol (PROVENTIL) (2.5 MG/3ML) 0.083% nebulizer solution USE 1 VIAL IN NEBULIZER EVERY 6 HOURS AS NEEDED FOR WHEEZING OR SHORTNESS OF BREATH. (Needs to be seen before next refill) (Patient taking differently: Take 2.5 mg by nebulization See admin instructions. Every three hours)  . albuterol (VENTOLIN HFA) 108 (90 Base) MCG/ACT inhaler INHALE 1-2 PUFFS EVERY 4 HOURS AS NEEDED FOR WHEEZING AND SHORTNESS OF BREATH.  Marland Kitchen amitriptyline (ELAVIL) 25 MG tablet TAKE 1 TO 2 TABLETS AT BEDTIME.  Marland Kitchen amitriptyline (ELAVIL) 25 MG tablet TAKE 1 TO 2 TABLETS AT BEDTIME.  Marland Kitchen Cyanocobalamin (B-12 PO) Take 1,000 mcg by mouth 2 (two) times a week.   . cyclobenzaprine (FLEXERIL) 10 MG tablet Take 1 tablet (10 mg total) by mouth at bedtime. (Patient taking differently: Take 10 mg by mouth 2 (two) times daily. )  . diazepam (VALIUM) 5 MG  tablet TAKE 1 TABLET 1 HOUR PRIOR TO MRI.  Marland Kitchen diclofenac Sodium (VOLTAREN) 1 % GEL Apply 2 g topically 4 (four) times daily.  Marland Kitchen esomeprazole (NEXIUM) 40 MG capsule TAKE 1 CAPSULE BY MOUTH ONCE DAILY.  . famotidine-calcium carbonate-magnesium hydroxide (PEPCID COMPLETE) 10-800-165 MG chewable tablet Chew 2 tablets by mouth daily as needed (heartburn).  . gabapentin (NEURONTIN) 300 MG capsule Take 1 capsule (300 mg total) by mouth 3 (three) times daily.  Marland Kitchen HYDROcodone-acetaminophen (NORCO/VICODIN) 5-325 MG tablet Take 1 tablet by mouth every 6 (six) hours as needed for moderate pain.  . IBU 800 MG tablet TAKE (1) TABLET EVERY EIGHT HOURS AS NEEDED.  Marland Kitchen INCRUSE ELLIPTA 62.5 MCG/INH AEPB INHALE 2 PUFFS INTO THE LUNGS DAILY.  . methocarbamol (ROBAXIN) 500 MG tablet Take 1 tablet (500 mg total) by mouth every 8 (eight) hours as needed for muscle spasms.  . ondansetron (ZOFRAN ODT) 8 MG disintegrating tablet Take 1 tablet (8 mg total) by mouth every 8 (eight) hours as needed for nausea or vomiting.  . palbociclib (IBRANCE) 125 MG tablet Take 1 tablet (125 mg total) by mouth daily. Take for 21 days on, 7 days off, repeat every 28 days.  Marland Kitchen PARoxetine (PAXIL) 10 MG tablet Take 1 tablet (10 mg total) by mouth daily. Needs to be seen before next refill  . SYMBICORT 160-4.5 MCG/ACT inhaler INHALE 2 PUFFS INTO THE LUNGS 2 TIMES A DAY.   Facility-Administered Encounter Medications as of 11/13/2019  Medication  . methylPREDNISolone acetate (DEPO-MEDROL)  injection 80 mg    Review of Systems  Constitutional: Negative for chills and fever.  HENT: Positive for ear pain. Negative for congestion, ear discharge, sinus pressure, sinus pain, sneezing and sore throat.   Eyes: Negative for visual disturbance.  Respiratory: Negative for cough, chest tightness and shortness of breath.   Cardiovascular: Negative for chest pain and leg swelling.  Musculoskeletal: Negative for back pain and gait problem.  Skin: Negative for  rash.  Neurological: Negative for light-headedness and headaches.  Psychiatric/Behavioral: Negative for agitation and behavioral problems.  All other systems reviewed and are negative.   Observations/Objective: Patient sounds comfortable and in no acute distress  Assessment and Plan: Problem List Items Addressed This Visit    None    Visit Diagnoses    Acute swimmer's ear of right side    -  Primary   Relevant Medications   neomycin-polymyxin-hydrocortisone (CORTISPORIN) OTIC solution      Will try ear drops Follow up plan: Return if symptoms worsen or fail to improve.     I discussed the assessment and treatment plan with the patient. The patient was provided an opportunity to ask questions and all were answered. The patient agreed with the plan and demonstrated an understanding of the instructions.   The patient was advised to call back or seek an in-person evaluation if the symptoms worsen or if the condition fails to improve as anticipated.  The above assessment and management plan was discussed with the patient. The patient verbalized understanding of and has agreed to the management plan. Patient is aware to call the clinic if symptoms persist or worsen. Patient is aware when to return to the clinic for a follow-up visit. Patient educated on when it is appropriate to go to the emergency department.    I provided 11 minutes of non-face-to-face time during this encounter.    Worthy Rancher, MD

## 2019-11-13 NOTE — Addendum Note (Signed)
Addended by: Caryl Pina on: 11/13/2019 05:08 PM   Modules accepted: Level of Service

## 2019-11-14 ENCOUNTER — Ambulatory Visit (HOSPITAL_COMMUNITY): Admission: RE | Admit: 2019-11-14 | Payer: Medicare HMO | Source: Ambulatory Visit

## 2019-11-14 ENCOUNTER — Inpatient Hospital Stay (HOSPITAL_COMMUNITY): Payer: Medicare HMO

## 2019-11-19 ENCOUNTER — Ambulatory Visit (HOSPITAL_COMMUNITY): Payer: Medicare HMO

## 2019-11-20 ENCOUNTER — Ambulatory Visit (HOSPITAL_COMMUNITY): Payer: Medicare HMO

## 2019-11-20 ENCOUNTER — Ambulatory Visit (HOSPITAL_COMMUNITY): Payer: Medicare HMO | Admitting: Oncology

## 2019-11-24 ENCOUNTER — Inpatient Hospital Stay (HOSPITAL_COMMUNITY): Payer: Medicare HMO

## 2019-11-24 ENCOUNTER — Ambulatory Visit (HOSPITAL_COMMUNITY): Admission: RE | Admit: 2019-11-24 | Payer: Medicare HMO | Source: Ambulatory Visit

## 2019-11-26 ENCOUNTER — Ambulatory Visit: Payer: Medicare HMO | Admitting: Family Medicine

## 2019-11-27 ENCOUNTER — Ambulatory Visit: Payer: Medicare HMO | Admitting: Licensed Clinical Social Worker

## 2019-11-27 ENCOUNTER — Ambulatory Visit (HOSPITAL_COMMUNITY): Payer: Medicare HMO | Admitting: Oncology

## 2019-11-27 ENCOUNTER — Ambulatory Visit (HOSPITAL_COMMUNITY): Payer: Medicare HMO

## 2019-11-27 DIAGNOSIS — J439 Emphysema, unspecified: Secondary | ICD-10-CM

## 2019-11-27 DIAGNOSIS — M1712 Unilateral primary osteoarthritis, left knee: Secondary | ICD-10-CM

## 2019-11-27 DIAGNOSIS — M5136 Other intervertebral disc degeneration, lumbar region: Secondary | ICD-10-CM

## 2019-11-27 DIAGNOSIS — K219 Gastro-esophageal reflux disease without esophagitis: Secondary | ICD-10-CM

## 2019-11-27 DIAGNOSIS — F32A Depression, unspecified: Secondary | ICD-10-CM

## 2019-11-27 DIAGNOSIS — C50919 Malignant neoplasm of unspecified site of unspecified female breast: Secondary | ICD-10-CM

## 2019-11-27 DIAGNOSIS — F419 Anxiety disorder, unspecified: Secondary | ICD-10-CM

## 2019-11-27 NOTE — Patient Instructions (Addendum)
Licensed Clinical Social Worker Visit Information  Materials Provided: No  11/27/2019  Name: Tracy Neal   MRN: [id number]       DOB: [birth year]  Tracy Neal is a 64 y.o. year old female who is a primary care patient of [name], Fransisca Kaufmann, MD. The CCM team was consulted for assistance with [name] .   Review of patient status, including review of consultants reports, other relevant assessments, and collaboration with appropriate care team members and the patient's provider was performed as part of comprehensive patient evaluation and provision of chronic care management services.    SDOH (Social Determinants of Health) assessments performed: No; risk for depression ; risk for social isolation; risk for tobacco use; risk for stress; risk for physical inactivity  LCSW called client home number today several times but LCSW was not able to speak with client. LCSW left phone message for client to please call LCSW at [phone]. LCSW also called phone numbers for son and daughter of client today but LCSW was not able to speak with son or daughter of client. LCSW left phone message for son and for daughter requesting return call to LCSW at [phone]  Follow Up Plan: LCSW to call client/daughterin next 4 weeks to talk with client or daughterabout housing needs of client  LCSW was not able to speak via phone today with client or son or daughter of client; thus the client or her son or daughter were not able to verbalize understanding of instructions provided today and were not able to accept or decline a print copy of patient instruction materials.   Norva Riffle.Trino Higinbotham MSW, LCSW Licensed Clinical Social Worker [facility] [phone]

## 2019-11-27 NOTE — Chronic Care Management (AMB) (Signed)
Chronic Care Management    Clinical Social Work Follow Up Note  11/27/2019 Name: Tracy Neal MRN: [id number] DOB: August 05, 1955  Tracy Neal is a 64 y.o. year old female who is a primary care patient of [name], Fransisca Kaufmann, MD. The CCM team was consulted for assistance with [name] .   Review of patient status, including review of consultants reports, other relevant assessments, and collaboration with appropriate care team members and the patient's provider was performed as part of comprehensive patient evaluation and provision of chronic care management services.    SDOH (Social Determinants of Health) assessments performed: No; risk for depression ; risk for social isolation; risk for tobacco use; risk for stress; risk for physical inactivity    Chronic Care Management from 06/06/2019 in [place]  PHQ-9 Total Score 4     GAD 7 : Generalized Anxiety Score 06/06/2019 05/27/2019 03/20/2019  Nervous, Anxious, on Edge 1 3 3   Control/stop worrying 1 3 3   Worry too much - different things 1 3 3   Trouble relaxing 0 3 3  Restless 0 3 2  Easily annoyed or irritable 0 3 3  Afraid - awful might happen 1 3 3   Total GAD 7 Score 4 21 20   Anxiety Difficulty Somewhat difficult - -    Outpatient Encounter Medications as of 11/27/2019  Medication Sig  . albuterol (PROVENTIL) (2.5 MG/3ML) 0.083% nebulizer solution USE 1 VIAL IN NEBULIZER EVERY 6 HOURS AS NEEDED FOR WHEEZING OR SHORTNESS OF BREATH. (Needs to be seen before next refill) (Patient taking differently: Take 2.5 mg by nebulization See admin instructions. Every three hours)  . albuterol (VENTOLIN HFA) 108 (90 Base) MCG/ACT inhaler INHALE 1-2 PUFFS EVERY 4 HOURS AS NEEDED FOR WHEEZING AND SHORTNESS OF BREATH.  Marland Kitchen amitriptyline (ELAVIL) 25 MG tablet TAKE 1 TO 2 TABLETS AT BEDTIME.  Marland Kitchen amitriptyline (ELAVIL) 25 MG tablet TAKE 1 TO 2 TABLETS AT BEDTIME.  Marland Kitchen Cyanocobalamin (B-12 PO) Take 1,000 mcg by  mouth 2 (two) times a week.   . cyclobenzaprine (FLEXERIL) 10 MG tablet Take 1 tablet (10 mg total) by mouth at bedtime. (Patient taking differently: Take 10 mg by mouth 2 (two) times daily. )  . diazepam (VALIUM) 5 MG tablet TAKE 1 TABLET 1 HOUR PRIOR TO MRI.  Marland Kitchen diclofenac Sodium (VOLTAREN) 1 % GEL Apply 2 g topically 4 (four) times daily.  Marland Kitchen esomeprazole (NEXIUM) 40 MG capsule TAKE 1 CAPSULE BY MOUTH ONCE DAILY.  . famotidine-calcium carbonate-magnesium hydroxide (PEPCID COMPLETE) 10-800-165 MG chewable tablet Chew 2 tablets by mouth daily as needed (heartburn).  . gabapentin (NEURONTIN) 300 MG capsule Take 1 capsule (300 mg total) by mouth 3 (three) times daily.  Marland Kitchen HYDROcodone-acetaminophen (NORCO/VICODIN) 5-325 MG tablet Take 1 tablet by mouth every 6 (six) hours as needed for moderate pain.  . IBU 800 MG tablet TAKE (1) TABLET EVERY EIGHT HOURS AS NEEDED.  Marland Kitchen INCRUSE ELLIPTA 62.5 MCG/INH AEPB INHALE 2 PUFFS INTO THE LUNGS DAILY.  . methocarbamol (ROBAXIN) 500 MG tablet Take 1 tablet (500 mg total) by mouth every 8 (eight) hours as needed for muscle spasms.  Marland Kitchen neomycin-polymyxin-hydrocortisone (CORTISPORIN) OTIC solution Place 3 drops into the right ear 4 (four) times daily.  . ondansetron (ZOFRAN ODT) 8 MG disintegrating tablet Take 1 tablet (8 mg total) by mouth every 8 (eight) hours as needed for nausea or vomiting.  . palbociclib (IBRANCE) 125 MG tablet Take 1 tablet (125 mg total) by mouth daily. Take for 21  days on, 7 days off, repeat every 28 days.  Marland Kitchen PARoxetine (PAXIL) 10 MG tablet Take 1 tablet (10 mg total) by mouth daily. Needs to be seen before next refill  . SYMBICORT 160-4.5 MCG/ACT inhaler INHALE 2 PUFFS INTO THE LUNGS 2 TIMES A DAY.   Facility-Administered Encounter Medications as of 11/27/2019  Medication  . methylPREDNISolone acetate (DEPO-MEDROL) injection 80 mg    LCSW called client home number today several times but LCSW was not able to speak with client. LCSW left phone  message for client to please call LCSW at [phone]. LCSW also called phone numbers for son and daughter of client today but LCSW was not able to speak with son or daughter of client. LCSW left phone message for son and for daughter requesting return call to LCSW at [phone]  Follow Up Plan:  LCSW to call client/daughter  in next 4 weeks to talk with client or daughter about housing needs of client  Norva Riffle.Kaylin Schellenberg MSW, LCSW Licensed Clinical Social Worker [facility] [phone]

## 2019-12-04 ENCOUNTER — Inpatient Hospital Stay (HOSPITAL_COMMUNITY): Payer: Medicare HMO | Attending: Hematology

## 2019-12-04 ENCOUNTER — Other Ambulatory Visit: Payer: Self-pay

## 2019-12-04 ENCOUNTER — Ambulatory Visit (HOSPITAL_COMMUNITY)
Admission: RE | Admit: 2019-12-04 | Discharge: 2019-12-04 | Disposition: A | Discharge status: Home / Self Care | Payer: Medicare HMO | Source: Ambulatory Visit | Attending: Hematology | Admitting: Hematology

## 2019-12-04 DIAGNOSIS — Z79818 Long term (current) use of other agents affecting estrogen receptors and estrogen levels: Secondary | ICD-10-CM | POA: Diagnosis not present

## 2019-12-04 DIAGNOSIS — Z170 Estrogen receptor positive status [ER+]: Secondary | ICD-10-CM | POA: Insufficient documentation

## 2019-12-04 DIAGNOSIS — C50911 Malignant neoplasm of unspecified site of right female breast: Secondary | ICD-10-CM | POA: Insufficient documentation

## 2019-12-04 DIAGNOSIS — C50919 Malignant neoplasm of unspecified site of unspecified female breast: Secondary | ICD-10-CM

## 2019-12-04 DIAGNOSIS — F17210 Nicotine dependence, cigarettes, uncomplicated: Secondary | ICD-10-CM | POA: Insufficient documentation

## 2019-12-04 DIAGNOSIS — M810 Age-related osteoporosis without current pathological fracture: Secondary | ICD-10-CM | POA: Diagnosis not present

## 2019-12-04 DIAGNOSIS — R110 Nausea: Secondary | ICD-10-CM | POA: Diagnosis not present

## 2019-12-04 DIAGNOSIS — Z79899 Other long term (current) drug therapy: Secondary | ICD-10-CM | POA: Insufficient documentation

## 2019-12-04 DIAGNOSIS — F32A Depression, unspecified: Secondary | ICD-10-CM | POA: Insufficient documentation

## 2019-12-04 DIAGNOSIS — M545 Low back pain, unspecified: Secondary | ICD-10-CM | POA: Insufficient documentation

## 2019-12-04 DIAGNOSIS — C7951 Secondary malignant neoplasm of bone: Secondary | ICD-10-CM | POA: Diagnosis not present

## 2019-12-04 DIAGNOSIS — J449 Chronic obstructive pulmonary disease, unspecified: Secondary | ICD-10-CM | POA: Insufficient documentation

## 2019-12-04 DIAGNOSIS — C7800 Secondary malignant neoplasm of unspecified lung: Secondary | ICD-10-CM | POA: Diagnosis not present

## 2019-12-04 LAB — CBC WITH DIFFERENTIAL/PLATELET
Abs Immature Granulocytes: 0.03 10*3/uL (ref 0.00–0.07)
Basophils Absolute: 0.1 10*3/uL (ref 0.0–0.1)
Basophils Relative: 2 %
Eosinophils Absolute: 0.1 10*3/uL (ref 0.0–0.5)
Eosinophils Relative: 2 %
HCT: 45.8 % (ref 36.0–46.0)
Hemoglobin: 15.6 g/dL — ABNORMAL HIGH (ref 12.0–15.0)
Immature Granulocytes: 1 %
Lymphocytes Relative: 45 %
Lymphs Abs: 3 10*3/uL (ref 0.7–4.0)
MCH: 30.7 pg (ref 26.0–34.0)
MCHC: 34.1 g/dL (ref 30.0–36.0)
MCV: 90.2 fL (ref 80.0–100.0)
Monocytes Absolute: 0.7 10*3/uL (ref 0.1–1.0)
Monocytes Relative: 10 %
Neutro Abs: 2.6 10*3/uL (ref 1.7–7.7)
Neutrophils Relative %: 40 %
Platelets: 169 10*3/uL (ref 150–400)
RBC: 5.08 MIL/uL (ref 3.87–5.11)
RDW: 25.4 % — ABNORMAL HIGH (ref 11.5–15.5)
WBC: 6.5 10*3/uL (ref 4.0–10.5)
nRBC: 0 % (ref 0.0–0.2)

## 2019-12-04 LAB — COMPREHENSIVE METABOLIC PANEL
ALT: 23 U/L (ref 0–44)
AST: 32 U/L (ref 15–41)
Albumin: 3.1 g/dL — ABNORMAL LOW (ref 3.5–5.0)
Alkaline Phosphatase: 319 U/L — ABNORMAL HIGH (ref 38–126)
Anion gap: 8 (ref 5–15)
BUN: 24 mg/dL — ABNORMAL HIGH (ref 8–23)
CO2: 22 mmol/L (ref 22–32)
Calcium: 9 mg/dL (ref 8.9–10.3)
Chloride: 102 mmol/L (ref 98–111)
Creatinine, Ser: 1.04 mg/dL — ABNORMAL HIGH (ref 0.44–1.00)
GFR, Estimated: 60 mL/min — ABNORMAL LOW (ref >60)
Glucose, Bld: 106 mg/dL — ABNORMAL HIGH (ref 70–99)
Potassium: 4.1 mmol/L (ref 3.5–5.1)
Sodium: 132 mmol/L — ABNORMAL LOW (ref 135–145)
Total Bilirubin: 1.4 mg/dL — ABNORMAL HIGH (ref 0.3–1.2)
Total Protein: 7.2 g/dL (ref 6.5–8.1)

## 2019-12-04 MED ORDER — GADOBUTROL 1 MMOL/ML IV SOLN
10.0000 mL | Freq: Once | INTRAVENOUS | Status: AC | PRN
  Administered 2019-12-04: 10 mL via INTRAVENOUS

## 2019-12-16 ENCOUNTER — Other Ambulatory Visit: Payer: Self-pay

## 2019-12-16 ENCOUNTER — Other Ambulatory Visit: Payer: Self-pay | Admitting: Family Medicine

## 2019-12-16 ENCOUNTER — Inpatient Hospital Stay (HOSPITAL_BASED_OUTPATIENT_CLINIC_OR_DEPARTMENT_OTHER): Payer: Medicare HMO | Admitting: Hematology

## 2019-12-16 ENCOUNTER — Other Ambulatory Visit (HOSPITAL_COMMUNITY): Payer: Self-pay | Admitting: *Deleted

## 2019-12-16 ENCOUNTER — Inpatient Hospital Stay (HOSPITAL_COMMUNITY): Payer: Medicare HMO

## 2019-12-16 VITALS — BP 141/76 | HR 82 | Temp 97.3°F | Resp 18 | Wt 239.8 lb

## 2019-12-16 DIAGNOSIS — C50919 Malignant neoplasm of unspecified site of unspecified female breast: Secondary | ICD-10-CM

## 2019-12-16 DIAGNOSIS — C50911 Malignant neoplasm of unspecified site of right female breast: Secondary | ICD-10-CM | POA: Diagnosis not present

## 2019-12-16 DIAGNOSIS — M5431 Sciatica, right side: Secondary | ICD-10-CM

## 2019-12-16 DIAGNOSIS — J439 Emphysema, unspecified: Secondary | ICD-10-CM

## 2019-12-16 DIAGNOSIS — K219 Gastro-esophageal reflux disease without esophagitis: Secondary | ICD-10-CM

## 2019-12-16 MED ORDER — HYDROCODONE-ACETAMINOPHEN 5-325 MG PO TABS: 1.0000 | ORAL_TABLET | Freq: Two times a day (BID) | ORAL | 0 refills | Status: DC | PRN

## 2019-12-16 MED ORDER — FULVESTRANT 250 MG/5ML IM SOLN
500.0000 mg | Freq: Once | INTRAMUSCULAR | Status: AC
  Administered 2019-12-16: 500 mg via INTRAMUSCULAR
  Filled 2019-12-16: qty 10

## 2019-12-16 NOTE — Progress Notes (Signed)
 [facility] [address] [place], [place] [address]   Patient Care Team: [name], Joshua A, MD as PCP - General (Family Medicine) Hudy, Kristen N, RN as Case Manager Forrest, Michael S, LCSW as Social Worker (Licensed Clinical Social Worker) Dishmon, Morgan P, RN as Oncology Nurse Navigator (Oncology) Wilson, Diane G, RN as Oncology Nurse Navigator (Oncology)  SUMMARY OF ONCOLOGIC HISTORY: Oncology History   No history exists.    CHIEF COMPLIANT: metastatic right breast cancer   INTERVAL HISTORY: Tracy Neal is a 64 y.o. female here today for follow up of her metastatic right breast cancer. Her last visit was on 09/24/2019.   Today she is accompanied by her friend and she reports feeling tired. She is taking Ibrance for 3 weeks on and 1 week off; she started her recent cycle on 11/20. She complains of having right lower back pain radiating down her right leg and causing excruciating pain in her leg. She walks with a cane and has a walker at home as needed; she has a ramp at home now. She denies improvement in her back pain since starting Ibrance. She reports that her back and leg pain is worse after getting Faslodex and greatly hurts with walking. She reports feeling nauseous most days and takes Zofran PRN.    REVIEW OF SYSTEMS:   Review of Systems  Constitutional: Positive for appetite change (50%) and fatigue (depleted).  Respiratory: Positive for shortness of breath (d/t COPD).   Gastrointestinal: Positive for constipation, nausea and vomiting.  Musculoskeletal: Positive for back pain (R lower back pain radiating down R leg).  Neurological: Positive for dizziness (occasional), headaches (occasional) and numbness (R hip area).  Psychiatric/Behavioral: Positive for depression. The patient is nervous/anxious.   All other systems reviewed and are negative.   I have reviewed the past medical history, past surgical history, social history and family history  with the patient and they are unchanged from previous note.   ALLERGIES:   is allergic to percocet [oxycodone-acetaminophen].   MEDICATIONS:  Current Outpatient Medications  Medication Sig Dispense Refill  . albuterol (PROVENTIL) (2.5 MG/3ML) 0.083% nebulizer solution USE 1 VIAL IN NEBULIZER EVERY 6 HOURS AS NEEDED FOR WHEEZING OR SHORTNESS OF BREATH. (Needs to be seen before next refill) (Patient taking differently: Take 2.5 mg by nebulization See admin instructions. Every three hours) 360 mL 0  . albuterol (VENTOLIN HFA) 108 (90 Base) MCG/ACT inhaler INHALE 1-2 PUFFS EVERY 4 HOURS AS NEEDED FOR WHEEZING AND SHORTNESS OF BREATH. 18 g 0  . amitriptyline (ELAVIL) 25 MG tablet TAKE 1 TO 2 TABLETS AT BEDTIME. 60 tablet 0  . amitriptyline (ELAVIL) 25 MG tablet TAKE 1 TO 2 TABLETS AT BEDTIME. 60 tablet 0  . Cyanocobalamin (B-12 PO) Take 1,000 mcg by mouth 2 (two) times a week.     . cyclobenzaprine (FLEXERIL) 10 MG tablet Take 1 tablet (10 mg total) by mouth at bedtime. (Patient taking differently: Take 10 mg by mouth 2 (two) times daily. ) 30 tablet 0  . diazepam (VALIUM) 5 MG tablet TAKE 1 TABLET 1 HOUR PRIOR TO MRI. 1 tablet 0  . diclofenac Sodium (VOLTAREN) 1 % GEL Apply 2 g topically 4 (four) times daily. 350 g 3  . esomeprazole (NEXIUM) 40 MG capsule TAKE 1 CAPSULE BY MOUTH ONCE DAILY. 30 capsule 0  . famotidine-calcium carbonate-magnesium hydroxide (PEPCID COMPLETE) 10-800-165 MG chewable tablet Chew 2 tablets by mouth daily as needed (heartburn). 60 tablet 1  . gabapentin (  NEURONTIN) 300 MG capsule Take 1 capsule (300 mg total) by mouth 3 (three) times daily. 90 capsule 5  . IBU 800 MG tablet TAKE (1) TABLET EVERY EIGHT HOURS AS NEEDED. 60 tablet 1  . INCRUSE ELLIPTA 62.5 MCG/INH AEPB INHALE 2 PUFFS INTO THE LUNGS DAILY. 30 each 0  . methocarbamol (ROBAXIN) 500 MG tablet Take 1 tablet (500 mg total) by mouth every 8 (eight) hours as needed for muscle spasms. 20 tablet 0  .  neomycin-polymyxin-hydrocortisone (CORTISPORIN) OTIC solution Place 3 drops into the right ear 4 (four) times daily. 10 mL 0  . palbociclib (IBRANCE) 125 MG tablet Take 1 tablet (125 mg total) by mouth daily. Take for 21 days on, 7 days off, repeat every 28 days. 21 tablet 2  . PARoxetine (PAXIL) 10 MG tablet Take 1 tablet (10 mg total) by mouth daily. Needs to be seen before next refill 30 tablet 0  . SYMBICORT 160-4.5 MCG/ACT inhaler INHALE 2 PUFFS INTO THE LUNGS 2 TIMES A DAY. 10.2 g 0  . HYDROcodone-acetaminophen (NORCO/VICODIN) 5-325 MG tablet Take 1 tablet by mouth every 6 (six) hours as needed for moderate pain. (Patient not taking: Reported on 12/16/2019) 30 tablet 0  . ondansetron (ZOFRAN ODT) 8 MG disintegrating tablet Take 1 tablet (8 mg total) by mouth every 8 (eight) hours as needed for nausea or vomiting. (Patient not taking: Reported on 12/16/2019) 20 tablet 3   Current Facility-Administered Medications  Medication Dose Route Frequency Provider Last Rate Last Admin  . methylPREDNISolone acetate (DEPO-MEDROL) injection 80 mg  80 mg Intramuscular Once [name], Fransisca Kaufmann, MD         PHYSICAL EXAMINATION: Performance status (ECOG): 1 - Symptomatic but completely ambulatory  Vitals:   12/16/19 1155  BP: (!) 141/76  Pulse: 82  Resp: 18  Temp: (!) 97.3 F (36.3 C)  SpO2: 97%   Wt Readings from Last 3 Encounters:  12/16/19 239 lb 12.8 oz (108.8 kg)  10/02/19 266 lb (120.7 kg)  09/24/19 266 lb 9.6 oz (120.9 kg)   Physical Exam Vitals reviewed.  Constitutional:      Appearance: Normal appearance. She is obese.  Cardiovascular:     Rate and Rhythm: Normal rate and regular rhythm.     Pulses: Normal pulses.     Heart sounds: Normal heart sounds.  Pulmonary:     Effort: Pulmonary effort is normal.     Breath sounds: Normal breath sounds.  Musculoskeletal:     Right lower leg: No edema.     Left lower leg: No edema.  Neurological:     General: No focal deficit present.       Mental Status: She is alert and oriented to person, place, and time.  Psychiatric:        Mood and Affect: Mood normal.        Behavior: Behavior normal.     Breast Exam Chaperone: Milinda Antis, MD     LABORATORY DATA:  I have reviewed the data as listed CMP Latest Ref Rng & Units 12/04/2019 08/08/2019 08/05/2019  Glucose 70 - 99 mg/dL 106(H) 100(H) 90  BUN 8 - 23 mg/dL 24(H) 20 14  Creatinine 0.44 - 1.00 mg/dL 1.04(H) 0.86 0.89  Sodium 135 - 145 mmol/L 132(L) 139 137  Potassium 3.5 - 5.1 mmol/L 4.1 3.8 4.0  Chloride 98 - 111 mmol/L 102 103 102  CO2 22 - 32 mmol/L 22 26 21(L)  Calcium 8.9 - 10.3 mg/dL 9.0 9.4 9.2  Total Protein  6.5 - 8.1 g/dL 7.2 7.2 -  Total Bilirubin 0.3 - 1.2 mg/dL 1.4(H) 0.5 -  Alkaline Phos 38 - 126 U/L 319(H) 117 -  AST 15 - 41 U/L 32 36 -  ALT 0 - 44 U/L 23 24 -   Lab Results  Component Value Date   CAN153 141.0 (H) 08/18/2019   Lab Results  Component Value Date   WBC 6.5 12/04/2019   HGB 15.6 (H) 12/04/2019   HCT 45.8 12/04/2019   MCV 90.2 12/04/2019   PLT 169 12/04/2019   NEUTROABS 2.6 12/04/2019    ASSESSMENT:  1. Metastatic breast cancer to the lungs and bones: -CT angio of the chest on 08/08/2019 shows multiple bilateral lung masses consistent with metastatic disease. Enlarged pretracheal and right hilar lymph nodes. 1.6 x 1.4 cm isodense left adrenal mass likely consistent with adrenal adenoma. -PET scan on 08/22/2019 showed widespread metastatic disease throughout the mediastinum, hilar lymph nodes, lungs and bones. Small left adrenal meta stasis. No involvement of liver. -Biopsy of the right iliac soft tissue mass on 09/17/2019 consistent with metastatic breast cancer. ER 100%, PR 2%, HER-2 IHC 2+, negative by FISH.  2. T2N0 right breast IDC: -Lumpectomy and SLNB on 07/15/2012, 3.2 cm IDC, margins negative, 0/2 lymph nodes involved, ER 100%, PR 0%, HER-2 negative, Ki-67 18%. -Oncotype DX score of 28. -Received 4 cycles of TC by  Dr. Darowskyin [place]. She also received adjuvant radiation. -She took anastrozole for 2 years and lost to follow-up. -Last mammogram on 03/30/2015 was BI-RADS Category 2. -She does report on and off numbness in the right upper extremity including arm and forearm.  3. Osteoporosis: -Bone density test on 04/07/2015 shows left femoral neck with a T score of -3.3.  4. Tobacco use: -She is a current active smoker 1 pack/day for 49 years.  5. Family history: -Paternal aunt died of "cancer".   PLAN:  1. Metastatic ER positive breast cancer to the lungs and bones: -She was started on Faslodex on 09/24/2019 and received another injection on 10/13/2019. -She reportedly took 1 bottle of Ibrance and is currently on a second bottle. -She missed multiple follow-up appointments. -I have counseled her to keep appointments. -We reviewed her labs from 12/04/2019 which showed normal CBC.  Total bilirubin was 1.4.  Creatinine 1.04. -She will receive Faslodex today. -RTC 4 weeks with labs.  2. Bone metastasis: -She does not have any teeth in the upper jaw.  Lower jaw she has only front teeth. -We talked about need for initiating denosumab. -We will discuss further and start denosumab at next visit.  3.  Low back and right hip pain: -MRI reports from 12/04/2019 reviewed.  Abnormal ventral greater than dorsal epidural enhancement in the lower lumbar and upper sacral spinal canal consistent with small volume epidural tumor. -Recommend evaluation by radiation oncology to see if role for palliative RT. -Have given prescription for hydrocodone to be taken as needed.    No orders of the defined types were placed in this encounter.  The patient has a good understanding of the overall plan. she agrees with it. she will call with any problems that may develop before the next visit here.    Sreedhar Katragadda, MD [facility] [phone]   I, Daniel Khashchuk, am acting as a scribe  for Dr. Sreedhar Katagadda.  I, Sreedhar Katragadda MD, have reviewed the above documentation for accuracy and completeness, and I agree with the above.     

## 2019-12-16 NOTE — Progress Notes (Signed)
Tracy Neal presents today for injection per the provider's orders.  Fulvestrant 500mg  given with Alferd Apa, administered  without incident; injection sites WNL; see MAR for injection details.  Patient tolerated procedure well and without incident.  No questions or complaints noted at this time. Patient discharged ambulatory with family member.

## 2019-12-16 NOTE — Patient Instructions (Signed)
[place] at [facility] Discharge Instructions  You were seen today by Dr. Delton Coombes. He went over your recent results and scans. Take Zofran 30 minutes before taking Ibrance to prevent getting nauseous and take as needed. Continue taking Ibrance 3 weeks on and take 1 week off. You will be referred to radiation in [facility] for your spinal vertebrae metastases. Dr. Delton Coombes will see you back in 4 weeks for labs and follow up.   Thank you for choosing [place] at [facility] to provide your oncology and hematology care.  To afford each patient quality time with our provider, please arrive at least 15 minutes before your scheduled appointment time.   If you have a lab appointment with the [facility] please come in thru the Main Entrance and check in at the main information desk  You need to re-schedule your appointment should you arrive 10 or more minutes late.  We strive to give you quality time with our providers, and arriving late affects you and other patients whose appointments are after yours.  Also, if you no show three or more times for appointments you may be dismissed from the clinic at the providers discretion.     Again, thank you for choosing [facility].  Our hope is that these requests will decrease the amount of time that you wait before being seen by our physicians.       _____________________________________________________________  Should you have questions after your visit to [facility], please contact our office at [phone] between the hours of 8:00 a.m. and 4:30 p.m.  Voicemails left after 4:00 p.m. will not be returned until the following business day.  For prescription refill requests, have your pharmacy contact our office and allow 72 hours.    Cancer Center Support Programs:   > Cancer Support Group  2nd Tuesday of the month 1pm-2pm, Journey Room

## 2019-12-18 ENCOUNTER — Ambulatory Visit (HOSPITAL_COMMUNITY): Payer: Medicare HMO

## 2019-12-18 ENCOUNTER — Ambulatory Visit (HOSPITAL_COMMUNITY): Payer: Medicare HMO | Admitting: Oncology

## 2019-12-25 ENCOUNTER — Encounter (HOSPITAL_COMMUNITY): Payer: Self-pay

## 2019-12-25 NOTE — Progress Notes (Signed)
Communication received from [facility] that the patient has cancelled her appts stating that she did not feel up to it. I have attempted to call the patient to discuss this further but was unable to reach the patient at this time. I have left a VM asking that the patient call me back. [facility] nursing staff aware.

## 2019-12-30 ENCOUNTER — Other Ambulatory Visit (HOSPITAL_COMMUNITY): Payer: Self-pay | Admitting: Hematology

## 2019-12-30 ENCOUNTER — Other Ambulatory Visit (HOSPITAL_COMMUNITY): Payer: Self-pay

## 2020-01-02 ENCOUNTER — Telehealth: Payer: Medicare HMO

## 2020-01-06 MED FILL — IBRANCE 125 MG TABS: 125 | 28 days supply | Qty: 21 | Fill #2

## 2020-01-13 ENCOUNTER — Inpatient Hospital Stay (HOSPITAL_COMMUNITY): Payer: Medicare HMO | Attending: Hematology

## 2020-01-13 ENCOUNTER — Ambulatory Visit (HOSPITAL_COMMUNITY): Payer: Medicare HMO | Admitting: Hematology

## 2020-01-13 ENCOUNTER — Ambulatory Visit (HOSPITAL_COMMUNITY): Payer: Medicare HMO

## 2020-01-14 ENCOUNTER — Other Ambulatory Visit: Payer: Self-pay | Admitting: Family Medicine

## 2020-01-14 DIAGNOSIS — J439 Emphysema, unspecified: Secondary | ICD-10-CM

## 2020-01-14 DIAGNOSIS — K219 Gastro-esophageal reflux disease without esophagitis: Secondary | ICD-10-CM

## 2020-01-16 ENCOUNTER — Other Ambulatory Visit (HOSPITAL_COMMUNITY): Payer: Self-pay | Admitting: Hematology

## 2020-01-16 ENCOUNTER — Other Ambulatory Visit: Payer: Self-pay | Admitting: Family Medicine

## 2020-01-16 DIAGNOSIS — M5431 Sciatica, right side: Secondary | ICD-10-CM

## 2020-02-06 ENCOUNTER — Other Ambulatory Visit: Payer: Self-pay | Admitting: Family Medicine

## 2020-02-06 ENCOUNTER — Other Ambulatory Visit (HOSPITAL_COMMUNITY): Payer: Self-pay | Admitting: Hematology

## 2020-02-06 DIAGNOSIS — K219 Gastro-esophageal reflux disease without esophagitis: Secondary | ICD-10-CM

## 2020-02-06 DIAGNOSIS — M5431 Sciatica, right side: Secondary | ICD-10-CM

## 2020-02-09 ENCOUNTER — Other Ambulatory Visit (HOSPITAL_COMMUNITY): Payer: Self-pay

## 2020-02-09 ENCOUNTER — Telehealth: Payer: Medicare HMO

## 2020-02-09 ENCOUNTER — Other Ambulatory Visit (HOSPITAL_COMMUNITY): Payer: Self-pay | Admitting: Hematology

## 2020-02-09 DIAGNOSIS — C50919 Malignant neoplasm of unspecified site of unspecified female breast: Secondary | ICD-10-CM

## 2020-02-09 MED ORDER — PALBOCICLIB 125 MG PO TABS: 125.0000 mg | ORAL_TABLET | Freq: Every day | ORAL | 2 refills | Status: DC

## 2020-02-10 MED FILL — IBRANCE 125 MG TABS: 125 | 28 days supply | Qty: 21 | Fill #0

## 2020-02-12 ENCOUNTER — Other Ambulatory Visit (HOSPITAL_COMMUNITY): Payer: Self-pay | Admitting: Hematology

## 2020-02-12 ENCOUNTER — Other Ambulatory Visit: Payer: Self-pay | Admitting: Family Medicine

## 2020-02-12 DIAGNOSIS — J439 Emphysema, unspecified: Secondary | ICD-10-CM

## 2020-02-13 ENCOUNTER — Other Ambulatory Visit (HOSPITAL_COMMUNITY): Payer: Self-pay

## 2020-02-13 DIAGNOSIS — M5431 Sciatica, right side: Secondary | ICD-10-CM

## 2020-02-16 ENCOUNTER — Other Ambulatory Visit: Payer: Self-pay

## 2020-02-16 ENCOUNTER — Inpatient Hospital Stay (HOSPITAL_COMMUNITY): Payer: Medicare HMO | Attending: Hematology

## 2020-02-16 DIAGNOSIS — C7800 Secondary malignant neoplasm of unspecified lung: Secondary | ICD-10-CM | POA: Diagnosis not present

## 2020-02-16 DIAGNOSIS — C7951 Secondary malignant neoplasm of bone: Secondary | ICD-10-CM | POA: Insufficient documentation

## 2020-02-16 DIAGNOSIS — Z170 Estrogen receptor positive status [ER+]: Secondary | ICD-10-CM | POA: Diagnosis not present

## 2020-02-16 DIAGNOSIS — C50919 Malignant neoplasm of unspecified site of unspecified female breast: Secondary | ICD-10-CM

## 2020-02-16 DIAGNOSIS — C50911 Malignant neoplasm of unspecified site of right female breast: Secondary | ICD-10-CM | POA: Diagnosis present

## 2020-02-16 LAB — CBC WITH DIFFERENTIAL/PLATELET
Abs Immature Granulocytes: 0.05 10*3/uL (ref 0.00–0.07)
Basophils Absolute: 0.2 10*3/uL — ABNORMAL HIGH (ref 0.0–0.1)
Basophils Relative: 2 %
Eosinophils Absolute: 0.1 10*3/uL (ref 0.0–0.5)
Eosinophils Relative: 2 %
HCT: 39.9 % (ref 36.0–46.0)
Hemoglobin: 13.7 g/dL (ref 12.0–15.0)
Immature Granulocytes: 1 %
Lymphocytes Relative: 37 %
Lymphs Abs: 2.5 10*3/uL (ref 0.7–4.0)
MCH: 37 pg — ABNORMAL HIGH (ref 26.0–34.0)
MCHC: 34.3 g/dL (ref 30.0–36.0)
MCV: 107.8 fL — ABNORMAL HIGH (ref 80.0–100.0)
Monocytes Absolute: 0.7 10*3/uL (ref 0.1–1.0)
Monocytes Relative: 11 %
Neutro Abs: 3.1 10*3/uL (ref 1.7–7.7)
Neutrophils Relative %: 47 %
Platelets: 253 10*3/uL (ref 150–400)
RBC: 3.7 MIL/uL — ABNORMAL LOW (ref 3.87–5.11)
RDW: 16.3 % — ABNORMAL HIGH (ref 11.5–15.5)
WBC: 6.7 10*3/uL (ref 4.0–10.5)
nRBC: 0.3 % — ABNORMAL HIGH (ref 0.0–0.2)

## 2020-02-16 LAB — COMPREHENSIVE METABOLIC PANEL
ALT: 21 U/L (ref 0–44)
AST: 28 U/L (ref 15–41)
Albumin: 3.2 g/dL — ABNORMAL LOW (ref 3.5–5.0)
Alkaline Phosphatase: 165 U/L — ABNORMAL HIGH (ref 38–126)
Anion gap: 6 (ref 5–15)
BUN: 21 mg/dL (ref 8–23)
CO2: 23 mmol/L (ref 22–32)
Calcium: 9.1 mg/dL (ref 8.9–10.3)
Chloride: 104 mmol/L (ref 98–111)
Creatinine, Ser: 0.88 mg/dL (ref 0.44–1.00)
GFR, Estimated: >60 mL/min (ref >60)
Glucose, Bld: 165 mg/dL — ABNORMAL HIGH (ref 70–99)
Potassium: 4 mmol/L (ref 3.5–5.1)
Sodium: 133 mmol/L — ABNORMAL LOW (ref 135–145)
Total Bilirubin: 0.5 mg/dL (ref 0.3–1.2)
Total Protein: 6.7 g/dL (ref 6.5–8.1)

## 2020-02-17 ENCOUNTER — Inpatient Hospital Stay (HOSPITAL_COMMUNITY): Payer: Medicare HMO | Attending: Hematology

## 2020-02-17 ENCOUNTER — Inpatient Hospital Stay (HOSPITAL_COMMUNITY): Payer: Medicare HMO | Attending: Hematology | Admitting: Hematology

## 2020-02-17 VITALS — BP 151/73 | HR 93 | Temp 97.4°F | Resp 18 | Wt 254.0 lb

## 2020-02-17 DIAGNOSIS — C7951 Secondary malignant neoplasm of bone: Secondary | ICD-10-CM | POA: Diagnosis not present

## 2020-02-17 DIAGNOSIS — M79651 Pain in right thigh: Secondary | ICD-10-CM | POA: Diagnosis not present

## 2020-02-17 DIAGNOSIS — F17210 Nicotine dependence, cigarettes, uncomplicated: Secondary | ICD-10-CM | POA: Diagnosis not present

## 2020-02-17 DIAGNOSIS — C50919 Malignant neoplasm of unspecified site of unspecified female breast: Secondary | ICD-10-CM

## 2020-02-17 DIAGNOSIS — C50911 Malignant neoplasm of unspecified site of right female breast: Secondary | ICD-10-CM | POA: Diagnosis present

## 2020-02-17 DIAGNOSIS — Z79899 Other long term (current) drug therapy: Secondary | ICD-10-CM | POA: Diagnosis not present

## 2020-02-17 DIAGNOSIS — Z79818 Long term (current) use of other agents affecting estrogen receptors and estrogen levels: Secondary | ICD-10-CM | POA: Insufficient documentation

## 2020-02-17 DIAGNOSIS — M810 Age-related osteoporosis without current pathological fracture: Secondary | ICD-10-CM | POA: Insufficient documentation

## 2020-02-17 DIAGNOSIS — R200 Anesthesia of skin: Secondary | ICD-10-CM | POA: Insufficient documentation

## 2020-02-17 DIAGNOSIS — M545 Low back pain, unspecified: Secondary | ICD-10-CM | POA: Insufficient documentation

## 2020-02-17 DIAGNOSIS — C7800 Secondary malignant neoplasm of unspecified lung: Secondary | ICD-10-CM | POA: Insufficient documentation

## 2020-02-17 DIAGNOSIS — Z7951 Long term (current) use of inhaled steroids: Secondary | ICD-10-CM | POA: Diagnosis not present

## 2020-02-17 DIAGNOSIS — Z170 Estrogen receptor positive status [ER+]: Secondary | ICD-10-CM | POA: Insufficient documentation

## 2020-02-17 DIAGNOSIS — M25551 Pain in right hip: Secondary | ICD-10-CM | POA: Diagnosis not present

## 2020-02-17 DIAGNOSIS — Z791 Long term (current) use of non-steroidal anti-inflammatories (NSAID): Secondary | ICD-10-CM | POA: Insufficient documentation

## 2020-02-17 DIAGNOSIS — R5383 Other fatigue: Secondary | ICD-10-CM | POA: Insufficient documentation

## 2020-02-17 MED ORDER — FULVESTRANT 250 MG/5ML IM SOLN
500.0000 mg | Freq: Once | INTRAMUSCULAR | Status: AC
  Administered 2020-02-17: 500 mg via INTRAMUSCULAR
  Filled 2020-02-17: qty 10

## 2020-02-17 MED ORDER — DENOSUMAB 120 MG/1.7ML ~~LOC~~ SOLN: 120.0000 mg | Freq: Once | SUBCUTANEOUS | Status: DC

## 2020-02-17 MED ORDER — DENOSUMAB 120 MG/1.7ML ~~LOC~~ SOLN
120.0000 mg | Freq: Once | SUBCUTANEOUS | Status: AC
  Administered 2020-02-17: 120 mg via SUBCUTANEOUS
  Filled 2020-02-17: qty 1.7

## 2020-02-17 NOTE — Progress Notes (Signed)
[facility] [address], [place] [address]   Patient Care Team: [name], Fransisca Kaufmann, MD as PCP - General (Family Medicine) Ilean China, RN as Case Manager Forrest, Norva Riffle, LCSW as Social Worker (Licensed Clinical Social Worker) Oletta Lamas, Garwin Brothers, RN as Oncology Nurse Navigator (Oncology) Donetta Potts, RN as Oncology Nurse Navigator (Oncology)  SUMMARY OF ONCOLOGIC HISTORY: Oncology History   No history exists.    CHIEF COMPLIANT: Follow-up for metastatic right breast cancer   INTERVAL HISTORY: Tracy Neal is a 65 y.o. female here today for follow up of her metastatic right breast cancer. Her last visit was on 12/16/2019.   Today she is accompanied by her daughter and she reports feeling well. She restarted her Ibrance on 01/31 after taking 1 week off. She continues having pain in her right flank radiating down her right hamstrings. She denies having any more N/V or severe fatigue. She also reports having numbness in her right lateral hip and has issue ambulating due to the pain. She complains of having right ear pain is dropping in Cortisporin injections which were prescribed by Dr. Warrick Parisian; she denies having any ear discharge. She takes Norco as needed for her pain during the day, generally once a day, and ibuprofen at bedtime. She denies having any jaw pain or ongoing dental infections.   REVIEW OF SYSTEMS:   Review of Systems  Constitutional: Positive for fatigue (90%). Negative for appetite change.  HENT:         R ear pain w/o discharge  Gastrointestinal: Positive for abdominal pain (5/10 R flank pain radiating down R leg). Negative for nausea and vomiting.  Hematological: Bruises/bleeds easily.  All other systems reviewed and are negative.   I have reviewed the past medical history, past surgical history, social history and family history with the patient and they are unchanged from previous note.   ALLERGIES:   is allergic to  percocet [oxycodone-acetaminophen].   MEDICATIONS:  Current Outpatient Medications  Medication Sig Dispense Refill  . albuterol (PROVENTIL) (2.5 MG/3ML) 0.083% nebulizer solution USE 1 VIAL IN NEBULIZER EVERY 6 HOURS AS NEEDED FOR WHEEZING OR SHORTNESS OF BREATH. (Needs to be seen before next refill) (Patient taking differently: Take 2.5 mg by nebulization See admin instructions. Every three hours) 360 mL 0  . albuterol (VENTOLIN HFA) 108 (90 Base) MCG/ACT inhaler INHALE 1-2 PUFFS EVERY 4 HOURS AS NEEDED FOR WHEEZING AND SHORTNESS OF BREATH. 18 g 0  . amitriptyline (ELAVIL) 25 MG tablet TAKE 1 TO 2 TABLETS AT BEDTIME. 60 tablet 0  . amitriptyline (ELAVIL) 25 MG tablet TAKE 1 TO 2 TABLETS AT BEDTIME. 60 tablet 0  . amitriptyline (ELAVIL) 25 MG tablet TAKE 1 TO 2 TABLETS AT BEDTIME. 60 tablet 0  . Cyanocobalamin (B-12 PO) Take 1,000 mcg by mouth 2 (two) times a week.     . cyclobenzaprine (FLEXERIL) 10 MG tablet TAKE 1 TABLET ONCE DAILY AT BEDTIME. 30 tablet 0  . diazepam (VALIUM) 5 MG tablet TAKE 1 TABLET 1 HOUR PRIOR TO MRI. 1 tablet 0  . diclofenac Sodium (VOLTAREN) 1 % GEL Apply 2 g topically 4 (four) times daily. 350 g 3  . esomeprazole (NEXIUM) 40 MG capsule TAKE 1 CAPSULE BY MOUTH ONCE DAILY. 30 capsule 1  . famotidine-calcium carbonate-magnesium hydroxide (PEPCID COMPLETE) 10-800-165 MG chewable tablet Chew 2 tablets by mouth daily as needed (heartburn). 60 tablet 1  . gabapentin (NEURONTIN) 300 MG capsule Take 1 capsule (300  mg total) by mouth 3 (three) times daily. 90 capsule 5  . HYDROcodone-acetaminophen (NORCO/VICODIN) 5-325 MG tablet TAKE 1 TABLET EVERY 12 HOURS AS NEEDED FOR MODERATE PAIN. 30 tablet 0  . IBU 800 MG tablet TAKE (1) TABLET EVERY EIGHT HOURS AS NEEDED. 60 tablet 0  . INCRUSE ELLIPTA 62.5 MCG/INH AEPB INHALE 2 PUFFS INTO THE LUNGS DAILY. 30 each 0  . methocarbamol (ROBAXIN) 500 MG tablet Take 1 tablet (500 mg total) by mouth every 8 (eight) hours as needed for muscle  spasms. 20 tablet 0  . neomycin-polymyxin-hydrocortisone (CORTISPORIN) OTIC solution Place 3 drops into the right ear 4 (four) times daily. 10 mL 0  . ondansetron (ZOFRAN-ODT) 8 MG disintegrating tablet TAKE 1 TABLET BY MOUTH EVERY 8 HOURS AS NEEDED FOR NAUSEA AND VOMITING. 20 tablet 0  . palbociclib (IBRANCE) 125 MG tablet Take 1 tablet (125 mg total) by mouth daily. Take for 21 days on, 7 days off, repeat every 28 days. 21 tablet 2  . PARoxetine (PAXIL) 10 MG tablet Take 1 tablet (10 mg total) by mouth daily. Needs to be seen before next refill 30 tablet 0  . SYMBICORT 160-4.5 MCG/ACT inhaler INHALE 2 PUFFS INTO THE LUNGS 2 TIMES A DAY. 10.2 g 0   Current Facility-Administered Medications  Medication Dose Route Frequency Provider Last Rate Last Admin  . methylPREDNISolone acetate (DEPO-MEDROL) injection 80 mg  80 mg Intramuscular Once [name], Fransisca Kaufmann, MD         PHYSICAL EXAMINATION: Performance status (ECOG): 1 - Symptomatic but completely ambulatory  Vitals:   02/17/20 1442  BP: (!) 151/73  Pulse: 93  Resp: 18  Temp: (!) 97.4 F (36.3 C)  SpO2: 100%   Wt Readings from Last 3 Encounters:  02/17/20 254 lb (115.2 kg)  12/16/19 239 lb 12.8 oz (108.8 kg)  10/02/19 266 lb (120.7 kg)   Physical Exam Vitals reviewed.  Constitutional:      Appearance: Normal appearance. She is obese.     Comments: In wheelchair  HENT:     Right Ear: Ear canal and external ear normal. No decreased hearing noted. No drainage, swelling or tenderness. No middle ear effusion. There is no impacted cerumen. No foreign body. No hemotympanum. Tympanic membrane is not perforated, erythematous or bulging.  Cardiovascular:     Rate and Rhythm: Normal rate and regular rhythm.     Pulses: Normal pulses.     Heart sounds: Normal heart sounds.  Pulmonary:     Effort: Pulmonary effort is normal.     Breath sounds: Normal breath sounds.  Neurological:     General: No focal deficit present.     Mental  Status: She is alert and oriented to person, place, and time.  Psychiatric:        Mood and Affect: Mood normal.        Behavior: Behavior normal.     Breast Exam Chaperone: Tracy Antis, MD     LABORATORY DATA:  I have reviewed the data as listed CMP Latest Ref Rng & Units 02/16/2020 12/04/2019 08/08/2019  Glucose 70 - 99 mg/dL 165(H) 106(H) 100(H)  BUN 8 - 23 mg/dL 21 24(H) 20  Creatinine 0.44 - 1.00 mg/dL 0.88 1.04(H) 0.86  Sodium 135 - 145 mmol/L 133(L) 132(L) 139  Potassium 3.5 - 5.1 mmol/L 4.0 4.1 3.8  Chloride 98 - 111 mmol/L 104 102 103  CO2 22 - 32 mmol/L 23 22 26   Calcium 8.9 - 10.3 mg/dL 9.1 9.0 9.4  Total  Protein 6.5 - 8.1 g/dL 6.7 7.2 7.2  Total Bilirubin 0.3 - 1.2 mg/dL 0.5 1.4(H) 0.5  Alkaline Phos 38 - 126 U/L 165(H) 319(H) 117  AST 15 - 41 U/L 28 32 36  ALT 0 - 44 U/L 21 23 24    Lab Results  Component Value Date   CAN153 141.0 (H) 08/18/2019   Lab Results  Component Value Date   WBC 6.7 02/16/2020   HGB 13.7 02/16/2020   HCT 39.9 02/16/2020   MCV 107.8 (H) 02/16/2020   PLT 253 02/16/2020   NEUTROABS 3.1 02/16/2020    ASSESSMENT:  1.Metastatic breast cancer to the lungs and bones: -CT angio of the chest on 08/08/2019 shows multiple bilateral lung masses consistent with metastatic disease. Enlarged pretracheal and right hilar lymph nodes. 1.6 x 1.4 cm isodense left adrenal mass likely consistent with adrenal adenoma. -PET scan on 08/22/2019 showed widespread metastatic disease throughout the mediastinum, hilar lymph nodes, lungs and bones. Small left adrenal meta stasis. No involvement of liver. -Biopsy of the right iliac soft tissue mass on 09/17/2019 consistent with metastatic breast cancer. ER 100%, PR 2%, HER-2 IHC 2+, negative by FISH. -Faslodex started on 09/24/2019.  [name] started around second week of November.  2. T2N0 right breast IDC: -Lumpectomy and SLNB on 07/15/2012, 3.2 cm IDC, margins negative, 0/2 lymph nodes involved, ER 100%, PR 0%,  HER-2 negative, Ki-67 18%. -Oncotype DX score of 28. -Received 4 cycles of TC by Dr. Gerline Legacy. She also received adjuvant radiation. -She took anastrozole for 2 years and lost to follow-up. -Last mammogram on 03/30/2015 was BI-RADS Category 2. -She does report on and off numbness in the right upper extremity including arm and forearm.  3. Osteoporosis: -Bone density test on 04/07/2015 shows left femoral neck with a T score of -3.3.  4. Tobacco use: -She is a current active smoker 1 pack/day for 49 years.  5. Family history: -Paternal aunt died of "cancer".   PLAN:  1.Metastatic ER positive breast cancer to the lungs and bones: -She started taking Ibrance yesterday after 1 week rest.  She missed her Faslodex injection last month.  I have again counseled her to not to miss doses and appointments. -She denied any major GI symptoms. -Reviewed her labs from 02/16/2020 which showed alkaline phosphatase has improved to 165.  Rest of LFTs are normal.  CBC was also normal. -RTC 4 weeks for follow-up.  2.Bone metastasis: -We talked about initiating her on denosumab.  We talked about side effects including but not limited to hypocalcemia and ONJ. -She will proceed with her first dose today.  Recommend calcium and vitamin D supplements.  3.  Low back and right hip pain: -MRI from 12/04/2019 showed abnormal ventral greater than dorsal epidural enhancement in the lower lumbar and upper sacral spinal canal consistent with small volume epidural tumor. -She complains of pain in the right thigh.  No weakness reported. -We have made a prior referral to radiation oncology.  However she missed her appointment.  We will make another referral. -She is requiring 0 to 1 tablets of hydrocodone daily.  Usually takes hydrocodone in the morning and ibuprofen in the evening.   Orders Placed This Encounter  Procedures  . CT CHEST ABDOMEN PELVIS W CONTRAST    Standing Status:   Future     Standing Expiration Date:   02/16/2021    Order Specific Question:   If indicated for the ordered procedure, I authorize the administration of contrast media per Radiology  protocol    Answer:   Yes    Order Specific Question:   Preferred imaging location?    Answer:   [facility]    Order Specific Question:   Release to patient    Answer:   Immediate    Order Specific Question:   Is Oral Contrast requested for this exam?    Answer:   Yes, Per Radiology protocol    Order Specific Question:   Reason for Exam (SYMPTOM  OR DIAGNOSIS REQUIRED)    Answer:   metastatic breast cancer  . CBC with Differential/Platelet    Standing Status:   Future    Standing Expiration Date:   02/16/2021    Order Specific Question:   Release to patient    Answer:   Immediate  . Comprehensive metabolic panel    Standing Status:   Future    Standing Expiration Date:   02/16/2021    Order Specific Question:   Release to patient    Answer:   Immediate  . Cancer antigen 15-3    Standing Status:   Future    Standing Expiration Date:   02/16/2021  . Cancer antigen 27.29    Standing Status:   Future    Standing Expiration Date:   02/16/2021   The patient has a good understanding of the overall plan. she agrees with it. she will call with any problems that may develop before the next visit here.    Derek Jack, MD [place] [phone]   I, Tracy Neal, am acting as a scribe for Dr. Sanda Linger.  I, Derek Jack MD, have reviewed the above documentation for accuracy and completeness, and I agree with the above.

## 2020-02-17 NOTE — Progress Notes (Signed)
Tracy Neal presents today for office visit and  injection per the provider's orders. Faslodex and Xgeva administration without incident; injection sites WNL; see MAR for injection details.  Patient tolerated procedure well and without incident.  No questions or complaints noted at this time. Patient discharged in stable condition via wheelchair with family member.

## 2020-02-17 NOTE — Progress Notes (Signed)
Patient was assessed by Dr. Delton Coombes and labs have been reviewed.  Patient is okay to proceed with xgeva and faslodex injections. Primary nurse and pharmacy aware.

## 2020-02-17 NOTE — Patient Instructions (Addendum)
[place] at [facility] Discharge Instructions  You were seen today by Dr. Delton Coombes. He went over your recent results. You received your Faslodex and Xgeva injections today; continue getting your injections every month. Purchase calcium-vitamin D tablets over the counter and take daily to strengthen your bones. You will be referred to [facility] for your radiation therapy. You will be scheduled for a CT scan of your chest and abdomen before your next visit. Dr. Delton Coombes will see you back in 4 weeks for labs and follow up.   Thank you for choosing [place] at [facility] to provide your oncology and hematology care.  To afford each patient quality time with our provider, please arrive at least 15 minutes before your scheduled appointment time.   If you have a lab appointment with the [facility] please come in thru the Main Entrance and check in at the main information desk  You need to re-schedule your appointment should you arrive 10 or more minutes late.  We strive to give you quality time with our providers, and arriving late affects you and other patients whose appointments are after yours.  Also, if you no show three or more times for appointments you may be dismissed from the clinic at the providers discretion.     Again, thank you for choosing [facility].  Our hope is that these requests will decrease the amount of time that you wait before being seen by our physicians.       _____________________________________________________________  Should you have questions after your visit to [facility], please contact our office at [phone] between the hours of 8:00 a.m. and 4:30 p.m.  Voicemails left after 4:00 p.m. will not be returned until the following business day.  For prescription refill requests, have your pharmacy contact our office and allow 72 hours.    Cancer Center Support Programs:   > Cancer Support  Group  2nd Tuesday of the month 1pm-2pm, Journey Room

## 2020-03-04 ENCOUNTER — Ambulatory Visit: Payer: Medicare HMO | Admitting: Family Medicine

## 2020-03-09 ENCOUNTER — Other Ambulatory Visit: Payer: Self-pay | Admitting: Family Medicine

## 2020-03-09 ENCOUNTER — Other Ambulatory Visit (HOSPITAL_COMMUNITY): Payer: Self-pay | Admitting: Hematology

## 2020-03-09 DIAGNOSIS — J439 Emphysema, unspecified: Secondary | ICD-10-CM

## 2020-03-09 DIAGNOSIS — M5431 Sciatica, right side: Secondary | ICD-10-CM

## 2020-03-12 ENCOUNTER — Telehealth: Payer: Medicare HMO

## 2020-03-15 ENCOUNTER — Other Ambulatory Visit: Payer: Self-pay

## 2020-03-15 ENCOUNTER — Ambulatory Visit (HOSPITAL_COMMUNITY)
Admission: RE | Admit: 2020-03-15 | Discharge: 2020-03-15 | Disposition: A | Discharge status: Home / Self Care | Payer: Medicare HMO | Source: Ambulatory Visit | Attending: Hematology | Admitting: Hematology

## 2020-03-15 ENCOUNTER — Inpatient Hospital Stay (HOSPITAL_COMMUNITY): Payer: Medicare HMO

## 2020-03-15 DIAGNOSIS — C7951 Secondary malignant neoplasm of bone: Secondary | ICD-10-CM | POA: Insufficient documentation

## 2020-03-15 DIAGNOSIS — I700 Atherosclerosis of aorta: Secondary | ICD-10-CM | POA: Diagnosis not present

## 2020-03-15 DIAGNOSIS — C50919 Malignant neoplasm of unspecified site of unspecified female breast: Secondary | ICD-10-CM

## 2020-03-15 DIAGNOSIS — J439 Emphysema, unspecified: Secondary | ICD-10-CM | POA: Diagnosis not present

## 2020-03-15 DIAGNOSIS — K449 Diaphragmatic hernia without obstruction or gangrene: Secondary | ICD-10-CM | POA: Insufficient documentation

## 2020-03-15 LAB — COMPREHENSIVE METABOLIC PANEL
ALT: 13 U/L (ref 0–44)
AST: 18 U/L (ref 15–41)
Albumin: 3.4 g/dL — ABNORMAL LOW (ref 3.5–5.0)
Alkaline Phosphatase: 105 U/L (ref 38–126)
Anion gap: 9 (ref 5–15)
BUN: 18 mg/dL (ref 8–23)
CO2: 24 mmol/L (ref 22–32)
Calcium: 8.7 mg/dL — ABNORMAL LOW (ref 8.9–10.3)
Chloride: 106 mmol/L (ref 98–111)
Creatinine, Ser: 0.81 mg/dL (ref 0.44–1.00)
GFR, Estimated: >60 mL/min (ref >60)
Glucose, Bld: 126 mg/dL — ABNORMAL HIGH (ref 70–99)
Potassium: 4.2 mmol/L (ref 3.5–5.1)
Sodium: 139 mmol/L (ref 135–145)
Total Bilirubin: 0.3 mg/dL (ref 0.3–1.2)
Total Protein: 6.7 g/dL (ref 6.5–8.1)

## 2020-03-15 LAB — CBC WITH DIFFERENTIAL/PLATELET
Abs Immature Granulocytes: 0.01 10*3/uL (ref 0.00–0.07)
Basophils Absolute: 0.1 10*3/uL (ref 0.0–0.1)
Basophils Relative: 1 %
Eosinophils Absolute: 0.1 10*3/uL (ref 0.0–0.5)
Eosinophils Relative: 3 %
HCT: 39.8 % (ref 36.0–46.0)
Hemoglobin: 13.2 g/dL (ref 12.0–15.0)
Immature Granulocytes: 0 %
Lymphocytes Relative: 51 %
Lymphs Abs: 2.1 10*3/uL (ref 0.7–4.0)
MCH: 36.6 pg — ABNORMAL HIGH (ref 26.0–34.0)
MCHC: 33.2 g/dL (ref 30.0–36.0)
MCV: 110.2 fL — ABNORMAL HIGH (ref 80.0–100.0)
Monocytes Absolute: 0.4 10*3/uL (ref 0.1–1.0)
Monocytes Relative: 9 %
Neutro Abs: 1.5 10*3/uL — ABNORMAL LOW (ref 1.7–7.7)
Neutrophils Relative %: 36 %
Platelets: 169 10*3/uL (ref 150–400)
RBC: 3.61 MIL/uL — ABNORMAL LOW (ref 3.87–5.11)
RDW: 14.7 % (ref 11.5–15.5)
WBC: 4.1 10*3/uL (ref 4.0–10.5)
nRBC: 0 % (ref 0.0–0.2)

## 2020-03-15 MED ORDER — IOHEXOL 300 MG/ML  SOLN
100.0000 mL | Freq: Once | INTRAMUSCULAR | Status: AC | PRN
  Administered 2020-03-15: 100 mL via INTRAVENOUS

## 2020-03-16 LAB — CANCER ANTIGEN 15-3: CA 15-3: 142 U/mL — ABNORMAL HIGH (ref 0.0–25.0)

## 2020-03-16 LAB — CANCER ANTIGEN 27.29: CA 27.29: 186.6 U/mL — ABNORMAL HIGH (ref 0.0–38.6)

## 2020-03-17 ENCOUNTER — Inpatient Hospital Stay (HOSPITAL_COMMUNITY): Payer: Medicare HMO | Attending: Hematology | Admitting: Hematology

## 2020-03-17 ENCOUNTER — Other Ambulatory Visit: Payer: Self-pay

## 2020-03-17 ENCOUNTER — Other Ambulatory Visit (HOSPITAL_COMMUNITY): Payer: Self-pay | Admitting: Hematology

## 2020-03-17 ENCOUNTER — Inpatient Hospital Stay (HOSPITAL_COMMUNITY): Payer: Medicare HMO

## 2020-03-17 VITALS — BP 85/62 | HR 84 | Temp 96.9°F | Resp 20

## 2020-03-17 DIAGNOSIS — C7951 Secondary malignant neoplasm of bone: Secondary | ICD-10-CM | POA: Insufficient documentation

## 2020-03-17 DIAGNOSIS — Z170 Estrogen receptor positive status [ER+]: Secondary | ICD-10-CM | POA: Diagnosis not present

## 2020-03-17 DIAGNOSIS — Z79818 Long term (current) use of other agents affecting estrogen receptors and estrogen levels: Secondary | ICD-10-CM | POA: Insufficient documentation

## 2020-03-17 DIAGNOSIS — Z79899 Other long term (current) drug therapy: Secondary | ICD-10-CM | POA: Diagnosis not present

## 2020-03-17 DIAGNOSIS — M810 Age-related osteoporosis without current pathological fracture: Secondary | ICD-10-CM | POA: Diagnosis not present

## 2020-03-17 DIAGNOSIS — R200 Anesthesia of skin: Secondary | ICD-10-CM | POA: Diagnosis not present

## 2020-03-17 DIAGNOSIS — M545 Low back pain, unspecified: Secondary | ICD-10-CM | POA: Diagnosis not present

## 2020-03-17 DIAGNOSIS — C50919 Malignant neoplasm of unspecified site of unspecified female breast: Secondary | ICD-10-CM

## 2020-03-17 DIAGNOSIS — M25551 Pain in right hip: Secondary | ICD-10-CM | POA: Diagnosis not present

## 2020-03-17 DIAGNOSIS — Z809 Family history of malignant neoplasm, unspecified: Secondary | ICD-10-CM | POA: Insufficient documentation

## 2020-03-17 DIAGNOSIS — C7800 Secondary malignant neoplasm of unspecified lung: Secondary | ICD-10-CM | POA: Diagnosis not present

## 2020-03-17 DIAGNOSIS — Z7951 Long term (current) use of inhaled steroids: Secondary | ICD-10-CM | POA: Diagnosis not present

## 2020-03-17 DIAGNOSIS — Z791 Long term (current) use of non-steroidal anti-inflammatories (NSAID): Secondary | ICD-10-CM | POA: Diagnosis not present

## 2020-03-17 DIAGNOSIS — R112 Nausea with vomiting, unspecified: Secondary | ICD-10-CM | POA: Diagnosis not present

## 2020-03-17 DIAGNOSIS — K921 Melena: Secondary | ICD-10-CM | POA: Diagnosis not present

## 2020-03-17 DIAGNOSIS — F17210 Nicotine dependence, cigarettes, uncomplicated: Secondary | ICD-10-CM | POA: Insufficient documentation

## 2020-03-17 DIAGNOSIS — K5900 Constipation, unspecified: Secondary | ICD-10-CM | POA: Diagnosis not present

## 2020-03-17 DIAGNOSIS — M5431 Sciatica, right side: Secondary | ICD-10-CM

## 2020-03-17 DIAGNOSIS — C50911 Malignant neoplasm of unspecified site of right female breast: Secondary | ICD-10-CM | POA: Diagnosis present

## 2020-03-17 MED ORDER — PALBOCICLIB 125 MG PO TABS: 125.0000 mg | ORAL_TABLET | Freq: Every day | ORAL | 2 refills | Status: DC

## 2020-03-17 MED ORDER — FULVESTRANT 250 MG/5ML IM SOLN
INTRAMUSCULAR | Status: AC
  Filled 2020-03-17: qty 10

## 2020-03-17 MED ORDER — DENOSUMAB 120 MG/1.7ML ~~LOC~~ SOLN
SUBCUTANEOUS | Status: AC
  Filled 2020-03-17: qty 1.7

## 2020-03-17 MED ORDER — DENOSUMAB 120 MG/1.7ML ~~LOC~~ SOLN
120.0000 mg | Freq: Once | SUBCUTANEOUS | Status: AC
  Administered 2020-03-17: 120 mg via SUBCUTANEOUS

## 2020-03-17 MED ORDER — HYDROCODONE-ACETAMINOPHEN 5-325 MG PO TABS: 1.0000 | ORAL_TABLET | Freq: Every day | ORAL | 0 refills | Status: DC | PRN

## 2020-03-17 MED ORDER — FULVESTRANT 250 MG/5ML IM SOLN
500.0000 mg | Freq: Once | INTRAMUSCULAR | Status: AC
  Administered 2020-03-17: 500 mg via INTRAMUSCULAR

## 2020-03-17 MED FILL — IBRANCE 125 MG TABS: 125 | 28 days supply | Qty: 21 | Fill #1

## 2020-03-17 NOTE — Patient Instructions (Addendum)
[place] at [facility] Discharge Instructions  You were seen today by Dr. Delton Coombes. He went over your recent results and scans. You received your Faslodex and Xgeva injections today; continue getting your injections every month. Continue going to radiation therapy daily to finish your course. Continue taking Ibrance 125 mg for 3 weeks on with 1 week off. Dr. Delton Coombes will see you back in 2 months for labs and follow up.   Thank you for choosing [place] at [facility] to provide your oncology and hematology care.  To afford each patient quality time with our provider, please arrive at least 15 minutes before your scheduled appointment time.   If you have a lab appointment with the [facility] please come in thru the Main Entrance and check in at the main information desk  You need to re-schedule your appointment should you arrive 10 or more minutes late.  We strive to give you quality time with our providers, and arriving late affects you and other patients whose appointments are after yours.  Also, if you no show three or more times for appointments you may be dismissed from the clinic at the providers discretion.     Again, thank you for choosing [facility].  Our hope is that these requests will decrease the amount of time that you wait before being seen by our physicians.       _____________________________________________________________  Should you have questions after your visit to [facility], please contact our office at [phone] between the hours of 8:00 a.m. and 4:30 p.m.  Voicemails left after 4:00 p.m. will not be returned until the following business day.  For prescription refill requests, have your pharmacy contact our office and allow 72 hours.    Cancer Center Support Programs:   > Cancer Support Group  2nd Tuesday of the month 1pm-2pm, Journey Room

## 2020-03-17 NOTE — Progress Notes (Signed)
Patient tolerated Faslodex injection with no complaints voiced.  Bilateral sites clean and dry with no bruising or swelling noted.  Band aids applied. See MAR for details.  VSS with discharge and left in satisfactory condition with no s/s of distress noted.    Patient taking calcium as directed.  Denied tooth, jaw, and leg pain.  No recent or upcoming dental visits.  Labs reviewed.  Patient tolerated injection with no complaints voiced.  See MAR for details.  Site clean and dry with no bruising or swelling noted.  Band aid applied.  Vss with discharge and left ambulatory with no s/s of distress noted.

## 2020-03-17 NOTE — Progress Notes (Signed)
Patient was assessed by Dr. Delton Coombes and labs have been reviewed.  Patient is okay to proceed with Faslodex and Xgeva  injections today. Primary RN and pharmacy aware.

## 2020-03-17 NOTE — Progress Notes (Signed)
[facility] [address] [place] [address]   Patient Care Team: [name], Fransisca Kaufmann, MD as PCP - General (Family Medicine) Ilean China, RN as Case Manager Forrest, Norva Riffle, LCSW as Social Worker (Licensed Clinical Social Worker) Oletta Lamas, Garwin Brothers, RN as Oncology Nurse Navigator (Oncology) Donetta Potts, RN as Oncology Nurse Navigator (Oncology)  SUMMARY OF ONCOLOGIC HISTORY: Oncology History  Metastatic breast cancer [facility])    CHIEF COMPLIANT: Follow-up for metastatic right breast cancer   INTERVAL HISTORY: [name] is a 65 y.o. female here today for follow up of her metastatic right breast cancer. Her last visit was on 02/17/2020.   Today she reports feeling fair. She complains of having overall generalized pain for the past several days. She will go back to radiation on Tuesday, 03/08. She is taking Norco more frequently than once daily for her pain, especially leg pain. The pain is waking her up at night and she feels sleepy during the day. She is taking Ibrance and this is her off week. She is taking calcium and vitamin D gummies.   REVIEW OF SYSTEMS:   Review of Systems  Constitutional: Positive for appetite change (50%).  Gastrointestinal: Positive for blood in stool, constipation, nausea and vomiting.  Musculoskeletal: Positive for myalgias (10/10 generalized body pain).  Psychiatric/Behavioral: Positive for sleep disturbance (d/t pain).  All other systems reviewed and are negative.   I have reviewed the past medical history, past surgical history, social history and family history with the patient and they are unchanged from previous note.   ALLERGIES:   is allergic to percocet [oxycodone-acetaminophen].   MEDICATIONS:  Current Outpatient Medications  Medication Sig Dispense Refill  . albuterol (PROVENTIL) (2.5 MG/3ML) 0.083% nebulizer solution USE 1 VIAL IN NEBULIZER EVERY 6 HOURS AS NEEDED FOR WHEEZING OR SHORTNESS OF  BREATH. (Needs to be seen before next refill) (Patient taking differently: Take 2.5 mg by nebulization See admin instructions. Every three hours) 360 mL 0  . albuterol (VENTOLIN HFA) 108 (90 Base) MCG/ACT inhaler INHALE 1-2 PUFFS EVERY 4 HOURS AS NEEDED FOR WHEEZING AND SHORTNESS OF BREATH. 18 g 0  . amitriptyline (ELAVIL) 25 MG tablet TAKE 1 TO 2 TABLETS AT BEDTIME. 60 tablet 0  . Cyanocobalamin (B-12 PO) Take 1,000 mcg by mouth 2 (two) times a week.     . cyclobenzaprine (FLEXERIL) 10 MG tablet TAKE 1 TABLET ONCE DAILY AT BEDTIME. 30 tablet 0  . diazepam (VALIUM) 5 MG tablet TAKE 1 TABLET 1 HOUR PRIOR TO MRI. 1 tablet 0  . diclofenac Sodium (VOLTAREN) 1 % GEL Apply 2 g topically 4 (four) times daily. 350 g 3  . esomeprazole (NEXIUM) 40 MG capsule TAKE 1 CAPSULE BY MOUTH ONCE DAILY. 30 capsule 1  . famotidine-calcium carbonate-magnesium hydroxide (PEPCID COMPLETE) 10-800-165 MG chewable tablet Chew 2 tablets by mouth daily as needed (heartburn). 60 tablet 1  . gabapentin (NEURONTIN) 300 MG capsule Take 1 capsule (300 mg total) by mouth 3 (three) times daily. 90 capsule 5  . HYDROcodone-acetaminophen (NORCO/VICODIN) 5-325 MG tablet TAKE 1 TABLET EVERY 12 HOURS AS NEEDED FOR MODERATE PAIN. 30 tablet 0  . IBU 800 MG tablet TAKE (1) TABLET EVERY EIGHT HOURS AS NEEDED. 60 tablet 0  . INCRUSE ELLIPTA 62.5 MCG/INH AEPB INHALE 2 PUFFS INTO THE LUNGS DAILY. 30 each 0  . methocarbamol (ROBAXIN) 500 MG tablet Take 1 tablet (500 mg total) by mouth every 8 (eight) hours as needed for muscle  spasms. 20 tablet 0  . neomycin-polymyxin-hydrocortisone (CORTISPORIN) OTIC solution Place 3 drops into the right ear 4 (four) times daily. 10 mL 0  . ondansetron (ZOFRAN-ODT) 8 MG disintegrating tablet TAKE 1 TABLET BY MOUTH EVERY 8 HOURS AS NEEDED FOR NAUSEA AND VOMITING. 20 tablet 0  . palbociclib (IBRANCE) 125 MG tablet Take 1 tablet (125 mg total) by mouth daily. Take for 21 days on, 7 days off, repeat every 28 days. 21  tablet 2  . PARoxetine (PAXIL) 10 MG tablet Take 1 tablet (10 mg total) by mouth daily. Needs to be seen before next refill 30 tablet 0  . SYMBICORT 160-4.5 MCG/ACT inhaler INHALE 2 PUFFS INTO THE LUNGS 2 TIMES A DAY. 10.2 g 0   Current Facility-Administered Medications  Medication Dose Route Frequency Provider Last Rate Last Admin  . methylPREDNISolone acetate (DEPO-MEDROL) injection 80 mg  80 mg Intramuscular Once [name], Fransisca Kaufmann, MD         PHYSICAL EXAMINATION: Performance status (ECOG): 1 - Symptomatic but completely ambulatory  Vitals:   03/17/20 1216  BP: (!) 85/62  Pulse: 84  Resp: 20  Temp: (!) 96.9 F (36.1 C)  SpO2: 92%   Wt Readings from Last 3 Encounters:  02/17/20 254 lb (115.2 kg)  12/16/19 239 lb 12.8 oz (108.8 kg)  10/02/19 266 lb (120.7 kg)   Physical Exam Vitals reviewed.  Constitutional:      Appearance: Normal appearance.     Comments: In wheelchair  Cardiovascular:     Rate and Rhythm: Normal rate and regular rhythm.     Pulses: Normal pulses.     Heart sounds: Normal heart sounds.  Pulmonary:     Effort: Pulmonary effort is normal.     Breath sounds: Normal breath sounds.  Neurological:     General: No focal deficit present.     Mental Status: She is alert and oriented to person, place, and time.  Psychiatric:        Mood and Affect: Mood normal.        Behavior: Behavior normal.     Breast Exam Chaperone: Milinda Antis, MD      LABORATORY DATA:  I have reviewed the data as listed CMP Latest Ref Rng & Units 03/15/2020 02/16/2020 12/04/2019  Glucose 70 - 99 mg/dL 126(H) 165(H) 106(H)  BUN 8 - 23 mg/dL 18 21 24(H)  Creatinine 0.44 - 1.00 mg/dL 0.81 0.88 1.04(H)  Sodium 135 - 145 mmol/L 139 133(L) 132(L)  Potassium 3.5 - 5.1 mmol/L 4.2 4.0 4.1  Chloride 98 - 111 mmol/L 106 104 102  CO2 22 - 32 mmol/L 24 23 22   Calcium 8.9 - 10.3 mg/dL 8.7(L) 9.1 9.0  Total Protein 6.5 - 8.1 g/dL 6.7 6.7 7.2  Total Bilirubin 0.3 - 1.2 mg/dL 0.3  0.5 1.4(H)  Alkaline Phos 38 - 126 U/L 105 165(H) 319(H)  AST 15 - 41 U/L 18 28 32  ALT 0 - 44 U/L 13 21 23    Lab Results  Component Value Date   CAN153 142.0 (H) 03/15/2020   CAN153 141.0 (H) 08/18/2019   Lab Results  Component Value Date   WBC 4.1 03/15/2020   HGB 13.2 03/15/2020   HCT 39.8 03/15/2020   MCV 110.2 (H) 03/15/2020   PLT 169 03/15/2020   NEUTROABS 1.5 (L) 03/15/2020    ASSESSMENT:  1.Metastatic breast cancer to the lungs and bones: -CT angio of the chest on 08/08/2019 shows multiple bilateral lung masses consistent with metastatic disease. Enlarged  pretracheal and right hilar lymph nodes. 1.6 x 1.4 cm isodense left adrenal mass likely consistent with adrenal adenoma. -PET scan on 08/22/2019 showed widespread metastatic disease throughout the mediastinum, hilar lymph nodes, lungs and bones. Small left adrenal meta stasis. No involvement of liver. -Biopsy of the right iliac soft tissue mass on 09/17/2019 consistent with metastatic breast cancer. ER 100%, PR 2%, HER-2 IHC 2+, negative by FISH. -Faslodex started on 09/24/2019.  [name] started around second week of November.  2. T2N0 right breast IDC: -Lumpectomy and SLNB on 07/15/2012, 3.2 cm IDC, margins negative, 0/2 lymph nodes involved, ER 100%, PR 0%, HER-2 negative, Ki-67 18%. -Oncotype DX score of 28. -Received 4 cycles of TC by Dr. Gerline Legacy. She also received adjuvant radiation. -She took anastrozole for 2 years and lost to follow-up. -Last mammogram on 03/30/2015 was BI-RADS Category 2. -She does report on and off numbness in the right upper extremity including arm and forearm.  3. Osteoporosis: -Bone density test on 04/07/2015 shows left femoral neck with a T score of -3.3.  4. Tobacco use: -She is a current active smoker 1 pack/day for 49 years.  5. Family history: -Paternal aunt died of "cancer".   PLAN:  1.Metastatic ER positive breast cancer to the lungs and bones: -Continue Ibrance  3 weeks on 1 week off.  No major side effects noted. -Continue monthly Faslodex injections.  She was told to not miss any doses. -Reviewed CT CAP from 03/15/2020 which showed response with decrease in size of pulmonary and thoracic nodal metastasis.  Left adrenal nodule also regressed in size.  Tumor markers or stable.  Today LFTs are within normal limits.  White count is 4.1 with normal ANC of 1.5. -Continue current treatment plan.  We will see her back in 2 months for follow-up.  2.Bone metastasis: -Continue denosumab monthly which was started in the first week of February.  She is tolerating it very well. -Continue calcium and vitamin D supplements.  3. Low back and right hip pain: -MRI from 12/04/2019 showed dorsal epidural enhancement in the lower lumbar and upper sacral spinal canal consistent with small volume epidural tumor. -We have referred her to radiation oncology but she has reportedly missed as many as 7 appointments. -She was seen by them and is undergoing simulation scans to start radiation therapy. -I have strongly reinforced her to keep appointments in complete radiation. -She complains of generalized body pains.  I have given prescription for hydrocodone 5/325 1 tablet daily as needed.  This will likely be the last prescription.   Breast Cancer therapy associated bone loss: I have recommended calcium, Vitamin D and weight bearing exercises.   No orders of the defined types were placed in this encounter.  The patient has a good understanding of the overall plan. she agrees with it. she will call with any problems that may develop before the next visit here.    Derek Jack, MD [place] [phone]   I, Milinda Antis, am acting as a scribe for Dr. Sanda Linger.  I, Derek Jack MD, have reviewed the above documentation for accuracy and completeness, and I agree with the above.

## 2020-03-26 ENCOUNTER — Ambulatory Visit: Payer: Medicare HMO | Admitting: Family Medicine

## 2020-03-30 DIAGNOSIS — C7951 Secondary malignant neoplasm of bone: Secondary | ICD-10-CM | POA: Insufficient documentation

## 2020-03-30 DIAGNOSIS — G893 Neoplasm related pain (acute) (chronic): Secondary | ICD-10-CM | POA: Insufficient documentation

## 2020-04-01 ENCOUNTER — Other Ambulatory Visit (HOSPITAL_COMMUNITY): Payer: Self-pay | Admitting: Hematology

## 2020-04-12 ENCOUNTER — Encounter (HOSPITAL_COMMUNITY): Payer: Self-pay

## 2020-04-12 NOTE — Progress Notes (Signed)
Notification received that patient would like to transfer care to [facility] for Medical Oncology. Patient has MedOnc appt on 3/31 at [facility]. All future appts at [facility] have been cancelled at the patient's request.

## 2020-04-13 ENCOUNTER — Telehealth: Payer: Medicare HMO

## 2020-04-13 ENCOUNTER — Other Ambulatory Visit (HOSPITAL_COMMUNITY): Payer: Self-pay

## 2020-04-14 ENCOUNTER — Other Ambulatory Visit: Payer: Self-pay | Admitting: Family Medicine

## 2020-04-14 ENCOUNTER — Inpatient Hospital Stay (HOSPITAL_COMMUNITY): Payer: Medicare HMO

## 2020-04-14 ENCOUNTER — Ambulatory Visit (HOSPITAL_COMMUNITY): Payer: Medicare HMO

## 2020-04-14 DIAGNOSIS — J439 Emphysema, unspecified: Secondary | ICD-10-CM

## 2020-04-14 DIAGNOSIS — K219 Gastro-esophageal reflux disease without esophagitis: Secondary | ICD-10-CM

## 2020-04-15 NOTE — Telephone Encounter (Signed)
Patient is due for all refills.  Follow up appointment scheduled on 04/23/20 for medication refills.

## 2020-04-23 ENCOUNTER — Ambulatory Visit: Payer: Medicare HMO | Admitting: Family Medicine

## 2020-04-28 ENCOUNTER — Other Ambulatory Visit: Payer: Self-pay | Admitting: Family Medicine

## 2020-04-28 DIAGNOSIS — J439 Emphysema, unspecified: Secondary | ICD-10-CM

## 2020-04-29 ENCOUNTER — Telehealth: Payer: Self-pay | Admitting: Family Medicine

## 2020-04-29 DIAGNOSIS — J439 Emphysema, unspecified: Secondary | ICD-10-CM

## 2020-04-29 NOTE — Telephone Encounter (Signed)
  Prescription Request  04/29/2020  What is the name of the medication or equipment? Nebulizer solution  Have you contacted your pharmacy to request a refill? (if applicable) yes  Which pharmacy would you like this sent to? [facility] in [place]   Patient notified that their request is being sent to the clinical staff for review and that they should receive a response within 2 business days.

## 2020-04-29 NOTE — Telephone Encounter (Signed)
LMOVM refill was sent in yesterday & to call office back to make chronic check up appt

## 2020-05-01 DIAGNOSIS — B029 Zoster without complications: Secondary | ICD-10-CM | POA: Insufficient documentation

## 2020-05-01 DIAGNOSIS — H25093 Other age-related incipient cataract, bilateral: Secondary | ICD-10-CM | POA: Insufficient documentation

## 2020-05-12 ENCOUNTER — Ambulatory Visit (HOSPITAL_COMMUNITY): Payer: Medicare HMO

## 2020-05-12 ENCOUNTER — Other Ambulatory Visit (HOSPITAL_COMMUNITY): Payer: Medicare HMO

## 2020-05-12 ENCOUNTER — Ambulatory Visit (HOSPITAL_COMMUNITY): Payer: Medicare HMO | Admitting: Hematology

## 2020-05-20 ENCOUNTER — Telehealth: Payer: Medicare HMO

## 2020-05-24 ENCOUNTER — Other Ambulatory Visit: Payer: Self-pay | Admitting: Family Medicine

## 2020-05-24 DIAGNOSIS — K219 Gastro-esophageal reflux disease without esophagitis: Secondary | ICD-10-CM

## 2020-06-09 ENCOUNTER — Encounter: Payer: Self-pay | Admitting: Family Medicine

## 2020-06-21 ENCOUNTER — Other Ambulatory Visit: Payer: Self-pay | Admitting: Family Medicine

## 2020-06-21 DIAGNOSIS — J439 Emphysema, unspecified: Secondary | ICD-10-CM

## 2020-06-21 DIAGNOSIS — K219 Gastro-esophageal reflux disease without esophagitis: Secondary | ICD-10-CM

## 2020-06-22 NOTE — Telephone Encounter (Signed)
Pt needs appt ASAP for refills/chronic care with [name]   7 meds denied

## 2020-06-28 ENCOUNTER — Telehealth: Payer: Medicare HMO

## 2020-07-07 ENCOUNTER — Other Ambulatory Visit: Payer: Self-pay | Admitting: Family Medicine

## 2020-07-07 DIAGNOSIS — J439 Emphysema, unspecified: Secondary | ICD-10-CM

## 2020-07-08 ENCOUNTER — Other Ambulatory Visit: Payer: Self-pay

## 2020-07-08 ENCOUNTER — Ambulatory Visit (INDEPENDENT_AMBULATORY_CARE_PROVIDER_SITE_OTHER): Payer: Medicare HMO | Admitting: Physician Assistant

## 2020-07-08 ENCOUNTER — Other Ambulatory Visit: Payer: Self-pay | Admitting: Family Medicine

## 2020-07-08 ENCOUNTER — Encounter (HOSPITAL_COMMUNITY): Payer: Self-pay | Admitting: Hematology

## 2020-07-08 ENCOUNTER — Encounter: Payer: Self-pay | Admitting: Physician Assistant

## 2020-07-08 VITALS — BP 120/65 | HR 90 | Temp 96.2°F | Resp 20 | Ht 67.0 in | Wt 243.5 lb

## 2020-07-08 DIAGNOSIS — K219 Gastro-esophageal reflux disease without esophagitis: Secondary | ICD-10-CM

## 2020-07-08 DIAGNOSIS — B029 Zoster without complications: Secondary | ICD-10-CM | POA: Diagnosis not present

## 2020-07-08 MED ORDER — VALACYCLOVIR HCL 1 G PO TABS: 1000.0000 mg | ORAL_TABLET | Freq: Three times a day (TID) | ORAL | 0 refills | Status: AC

## 2020-07-08 NOTE — Patient Instructions (Signed)
Shingles  Shingles is an infection. It gives you a painful skin rash and blisters that have fluid in them. Shingles is caused by the same germ (virus) that causes chickenpox. Shingles only happens in people who: Have had chickenpox. Have been given a shot (vaccine) to protect against chickenpox. Shingles is rare in this group. What are the causes? This condition is caused by varicella-zoster virus. This is the same germ that causes chickenpox. After a person is exposed to the germ, the germ stays in the body but is not active (dormant). Shingles develops if the germ becomes active again (is reactivated). This can happen many years after the first exposure to the germ. It is notknown what causes this germ to become active again. What increases the risk? People who have had chickenpox or received the chickenpox shot are at risk for shingles. This infection is more common in people who: Are older than 67 years of age. Have a weakened disease-fighting system (immune system), such as people with: HIV (human immunodeficiency virus). AIDS (acquired immunodeficiency syndrome). Cancer. Are taking medicines that weaken the immune system, such as organ transplant medicines. Have a lot of stress. What are the signs or symptoms? The first symptoms of shingles may be itching, tingling, or pain in an area onyour skin. A rash will show on your skin a few days or weeks later. This is what usually happens: The rash is likely to be on one side of your body. The rash usually has a shape like a belt or a band. Over time, the rash turns into fluid-filled blisters. The blisters will break open and change into scabs. The scabs usually dry up in about 2-3 weeks. You may also have: A fever. Chills. A headache. A feeling like you may vomit (nausea). How is this treated? The rash may last for several weeks. There is not a specific cure for thiscondition. Your doctor may prescribe medicines. Medicines may: Help  with pain. Help you get better sooner. Help to prevent long-term problems. Help with itching (antihistamines). If the area involved is on your face, you may need to see a specialist. Thismay be an eye doctor or an ear, nose, and throat (ENT) doctor. Follow these instructions at home: Medicines Take over-the-counter and prescription medicines only as told by your doctor. Put on an anti-itch cream or numbing cream where you have a rash, blisters, or scabs. Do this as told by your doctor. Helping with itching and discomfort  Put cold, wet cloths (cold compresses) on the area of the rash or blisters as told by your doctor. Cool baths can help you feel better. Try adding baking soda or dry oatmeal to the water to lessen itching. Do not bathe in hot water. Use calamine lotion as told by your doctor.  Blister and rash care Keep your rash covered with a loose bandage (dressing). Wear loose clothing that does not rub on your rash. Wash your hands with soap and water for at least 20 seconds before and after you change your bandage. If you cannot use soap and water, use hand sanitizer. Change your bandage as told by your doctor. Keep your rash and blisters clean. To do this, wash the area with mild soap and cool water as told by your doctor. Check your rash every day for signs of infection. Check for: More redness, swelling, or pain. Fluid or blood. Warmth. Pus or a bad smell. Do not scratch your rash. Do not pick at your blisters. To help you to  not scratch: Keep your fingernails clean and cut short. Wear gloves or mittens when you sleep, if scratching is a problem. General instructions Rest as told by your doctor. Wash your hands often with soap and water for at least 20 seconds. If you cannot use soap and water, use hand sanitizer. Doing this lowers your chance of getting a skin infection. Your infection can cause chickenpox in people who have never had chickenpox or never got a chickenpox  vaccine shot. If you have blisters that did not change into scabs yet, try not to touch other people or be around other people, especially: Babies. Pregnant women. Children who have areas of red, itchy, or rough skin (eczema). Older people who have organ transplants. People who have a long-term (chronic) illness, like cancer or AIDS. Keep all follow-up visits. How is this prevented? A vaccine shot is the best way to prevent shingles and protect against shinglesproblems. If you have not had a vaccine shot, talk with your doctor about getting it. Where to find more information Centers for Disease Control and Prevention: http://www.wolf.info/ Contact a doctor if: Your pain does not get better with medicine. Your pain does not get better after the rash heals. You have any of these signs of infection around the rash: More redness, swelling, or pain. Fluid or blood. Warmth. Pus or a bad smell. You have a fever. Get help right away if: The rash is on your face or nose. You have pain in your face or pain by your eye. You lose feeling on one side of your face. You have trouble seeing. You have ear pain, or you have ringing in your ear. You have a loss of taste. Your condition gets worse. Summary Shingles gives you a painful skin rash and blisters that have fluid in them. Shingles is caused by the same germ (virus) that causes chickenpox. Keep your rash covered with a loose bandage. Wear loose clothing that does not rub on your rash. If you have blisters that did not change into scabs yet, try not to touch other people or be around people. This information is not intended to replace advice given to you by your health care provider. Make sure you discuss any questions you have with your healthcare provider. Document Revised: 12/29/2019 Document Reviewed: 12/29/2019 Elsevier Patient Education  2022 [employer].

## 2020-07-08 NOTE — Telephone Encounter (Signed)
  Prescription Request  07/08/2020  What is the name of the medication or equipment? nexium  Have you contacted your pharmacy to request a refill? (if applicable) yes  Which pharmacy would you like this sent to? [facility]   Patient notified that their request is being sent to the clinical staff for review and that they should receive a response within 2 business days.

## 2020-07-08 NOTE — Progress Notes (Signed)
  Subjective:     Patient ID: Tracy Neal, female   DOB: 1955/04/15, 65 y.o.   MRN: [id number]  HPI Pt with painful rash to the R buttocks since this am Currently on Keflex due to cellulitis on the L buttocks  Review of Systems  Constitutional: Negative.   Skin:  Positive for rash.      Objective:   Physical Exam Vitals reviewed. Exam conducted with a chaperone present.  Skin:    Findings: Lesion and rash present.  Cluster of vesicles to the medial R buttocks No surrounding induration or erythema     Assessment:     1. Herpes zoster without complication        Plan:     Valtrex rx Continue Keflex for her other lesion OTC meds for sx [name] prn

## 2020-08-05 ENCOUNTER — Ambulatory Visit: Payer: Medicare HMO | Admitting: Family Medicine

## 2020-08-10 ENCOUNTER — Telehealth: Payer: Medicare HMO

## 2020-08-10 ENCOUNTER — Other Ambulatory Visit: Payer: Self-pay | Admitting: Family Medicine

## 2020-08-10 DIAGNOSIS — K219 Gastro-esophageal reflux disease without esophagitis: Secondary | ICD-10-CM

## 2020-08-10 DIAGNOSIS — J439 Emphysema, unspecified: Secondary | ICD-10-CM

## 2020-08-16 ENCOUNTER — Other Ambulatory Visit: Payer: Self-pay | Admitting: Family Medicine

## 2020-08-30 ENCOUNTER — Ambulatory Visit: Payer: Medicare HMO | Admitting: Family Medicine

## 2020-09-01 ENCOUNTER — Telehealth: Payer: Medicare HMO

## 2020-09-27 ENCOUNTER — Other Ambulatory Visit: Payer: Self-pay | Admitting: Family Medicine

## 2020-09-27 DIAGNOSIS — K219 Gastro-esophageal reflux disease without esophagitis: Secondary | ICD-10-CM

## 2020-09-27 DIAGNOSIS — J439 Emphysema, unspecified: Secondary | ICD-10-CM

## 2020-09-30 ENCOUNTER — Other Ambulatory Visit: Payer: Self-pay

## 2020-09-30 ENCOUNTER — Ambulatory Visit (INDEPENDENT_AMBULATORY_CARE_PROVIDER_SITE_OTHER): Payer: Medicare HMO

## 2020-09-30 ENCOUNTER — Encounter: Payer: Self-pay | Admitting: Family Medicine

## 2020-09-30 ENCOUNTER — Ambulatory Visit (INDEPENDENT_AMBULATORY_CARE_PROVIDER_SITE_OTHER): Payer: Medicare HMO | Admitting: Family Medicine

## 2020-09-30 VITALS — BP 120/84 | HR 95 | Ht 67.0 in | Wt 237.0 lb

## 2020-09-30 DIAGNOSIS — F419 Anxiety disorder, unspecified: Secondary | ICD-10-CM

## 2020-09-30 DIAGNOSIS — Z23 Encounter for immunization: Secondary | ICD-10-CM

## 2020-09-30 DIAGNOSIS — K219 Gastro-esophageal reflux disease without esophagitis: Secondary | ICD-10-CM | POA: Diagnosis not present

## 2020-09-30 DIAGNOSIS — F32A Depression, unspecified: Secondary | ICD-10-CM

## 2020-09-30 DIAGNOSIS — Z780 Asymptomatic menopausal state: Secondary | ICD-10-CM | POA: Diagnosis not present

## 2020-09-30 DIAGNOSIS — J439 Emphysema, unspecified: Secondary | ICD-10-CM | POA: Diagnosis not present

## 2020-09-30 DIAGNOSIS — F339 Major depressive disorder, recurrent, unspecified: Secondary | ICD-10-CM

## 2020-09-30 DIAGNOSIS — M1711 Unilateral primary osteoarthritis, right knee: Secondary | ICD-10-CM

## 2020-09-30 MED ORDER — ALBUTEROL SULFATE HFA 108 (90 BASE) MCG/ACT IN AERS: 1.0000 | INHALATION_SPRAY | RESPIRATORY_TRACT | 5 refills | Status: DC | PRN

## 2020-09-30 MED ORDER — BUDESONIDE-FORMOTEROL FUMARATE 160-4.5 MCG/ACT IN AERO: INHALATION_SPRAY | RESPIRATORY_TRACT | 3 refills | Status: DC

## 2020-09-30 MED ORDER — INCRUSE ELLIPTA 62.5 MCG/INH IN AEPB: 2.0000 | INHALATION_SPRAY | Freq: Every day | RESPIRATORY_TRACT | 5 refills | Status: DC

## 2020-09-30 MED ORDER — IBUPROFEN 800 MG PO TABS: ORAL_TABLET | ORAL | 5 refills | Status: DC

## 2020-09-30 MED ORDER — ALBUTEROL SULFATE (2.5 MG/3ML) 0.083% IN NEBU: 2.5000 mg | INHALATION_SOLUTION | RESPIRATORY_TRACT | 3 refills | Status: DC | PRN

## 2020-09-30 MED ORDER — DICLOFENAC SODIUM 1 % EX GEL: 2.0000 g | Freq: Four times a day (QID) | CUTANEOUS | 3 refills | Status: DC

## 2020-09-30 MED ORDER — AMITRIPTYLINE HCL 25 MG PO TABS: ORAL_TABLET | ORAL | 3 refills | Status: DC

## 2020-09-30 MED ORDER — ESOMEPRAZOLE MAGNESIUM 40 MG PO CPDR: 40.0000 mg | DELAYED_RELEASE_CAPSULE | Freq: Every day | ORAL | 3 refills | Status: DC

## 2020-09-30 NOTE — Progress Notes (Signed)
BP 120/84   Pulse 95   Ht 5\' 7"  (1.702 m)   Wt 237 lb (107.5 kg)   SpO2 96%   BMI 37.12 kg/m    Subjective:   Patient ID: Tracy Neal, female    DOB: 30-Jun-1955, 65 y.o.   MRN: [id number]  HPI: Tracy Neal is a 65 y.o. female presenting on 09/30/2020 for Medical Management of Chronic Issues   HPI COPD Patient is coming in for COPD recheck today.  He is currently on Incruse and Symbicort and albuterol.  He has a mild chronic cough but denies any major coughing spells or wheezing spells.  He has 2nighttime symptoms per week and 2daytime symptoms per week currently.  She feels like she is stable and has some symptoms but not doing too bad at all.  She sees oncology for metastatic breast cancer and is on medicine for that.  GERD Patient is currently on Nexium.  She denies any major symptoms or abdominal pain or belching or burping. She denies any blood in her stool or lightheadedness or dizziness.   Anxiety depression recheck Patient is not currently taking Paxil just takes amitriptyline in the evening to help her sleep and feels like it is pretty well for her.  She denies any need for the Paxil right now.  She says she is depressed sometimes but a lot of that is related to her health. Depression screen [facility] 2/9 07/08/2020 10/02/2019 06/06/2019 05/27/2019 05/27/2019  Decreased Interest 0 1 1 - 3  Down, Depressed, Hopeless 0 1 1 - 3  PHQ - 2 Score 0 2 2 - 6  Altered sleeping 0 - 0 - 3  Tired, decreased energy 0 - 1 - 3  Change in appetite 0 - 0 - 3  Feeling bad or failure about yourself  0 - 0 - 3  Trouble concentrating 0 - 0 - 3  Moving slowly or fidgety/restless 0 - 1 3 0  Suicidal thoughts 0 - 0 - 3  PHQ-9 Score 0 - 4 - 24  Difficult doing work/chores - - Somewhat difficult - -  Some recent data might be hidden     Relevant past medical, surgical, family and social history reviewed and updated as indicated. Interim medical history since our last visit  reviewed. Allergies and medications reviewed and updated.  Review of Systems  Constitutional:  Negative for chills and fever.  Eyes:  Negative for visual disturbance.  Respiratory:  Negative for chest tightness and shortness of breath.   Cardiovascular:  Negative for chest pain and leg swelling.  Musculoskeletal:  Negative for back pain and gait problem.  Skin:  Negative for rash.  Neurological:  Negative for light-headedness and headaches.  Psychiatric/Behavioral:  Negative for agitation and behavioral problems.   All other systems reviewed and are negative.  Per HPI unless specifically indicated above   Allergies as of 09/30/2020       Reactions   Percocet [oxycodone-acetaminophen] Itching, Nausea And Vomiting   Feels like bugs crawling on skin        Medication List        Accurate as of September 30, 2020  1:19 PM. If you have any questions, ask your nurse or doctor.          STOP taking these medications    diazepam 5 MG tablet Commonly known as: VALIUM Stopped by: Worthy Rancher, MD   PARoxetine 10 MG tablet Commonly known as: PAXIL Stopped by: Fransisca Kaufmann Jashad Depaula,  MD       TAKE these medications    albuterol 108 (90 Base) MCG/ACT inhaler Commonly known as: VENTOLIN HFA Inhale 1-2 puffs into the lungs every 4 (four) hours as needed for wheezing or shortness of breath. What changed: See the new instructions. Changed by: Fransisca Kaufmann Ceairra Mccarver, MD   albuterol (2.5 MG/3ML) 0.083% nebulizer solution Commonly known as: PROVENTIL Take 3 mLs (2.5 mg total) by nebulization every 4 (four) hours as needed for wheezing or shortness of breath. What changed: See the new instructions. Changed by: Fransisca Kaufmann Brezlyn Manrique, MD   amitriptyline 25 MG tablet Commonly known as: ELAVIL TAKE 1 TO 2 TABLETS AT BEDTIME.   B-12 PO Take 1,000 mcg by mouth 2 (two) times a week.   budesonide-formoterol 160-4.5 MCG/ACT inhaler Commonly known as: Symbicort INHALE 2 PUFFS INTO  THE LUNGS 2 TIMES A DAY.   cyclobenzaprine 10 MG tablet Commonly known as: FLEXERIL TAKE 1 TABLET ONCE DAILY AT BEDTIME.   diclofenac Sodium 1 % Gel Commonly known as: Voltaren Apply 2 g topically 4 (four) times daily.   esomeprazole 40 MG capsule Commonly known as: NEXIUM Take 1 capsule (40 mg total) by mouth daily.   famotidine-calcium carbonate-magnesium hydroxide 10-800-165 MG chewable tablet Commonly known as: PEPCID COMPLETE Chew 2 tablets by mouth daily as needed (heartburn).   gabapentin 300 MG capsule Commonly known as: NEURONTIN Take 1 capsule (300 mg total) by mouth 3 (three) times daily.   HYDROcodone-acetaminophen 5-325 MG tablet Commonly known as: NORCO/VICODIN Take 1 tablet by mouth daily as needed for moderate pain.   Ibrance 125 MG tablet Generic drug: palbociclib TAKE 1 TABLET (125 MG TOTAL) BY MOUTH DAILY. TAKE FOR 21 DAYS ON, 7 DAYS OFF, REPEAT EVERY 28 DAYS.   ibuprofen 800 MG tablet Commonly known as: IBU TAKE (1) TABLET EVERY EIGHT HOURS AS NEEDED.   Incruse Ellipta 62.5 MCG/INH Aepb Generic drug: umeclidinium bromide Inhale 2 puffs into the lungs daily.   methocarbamol 500 MG tablet Commonly known as: ROBAXIN Take 1 tablet (500 mg total) by mouth every 8 (eight) hours as needed for muscle spasms.   neomycin-polymyxin-hydrocortisone OTIC solution Commonly known as: CORTISPORIN Place 3 drops into the right ear 4 (four) times daily.   ondansetron 8 MG disintegrating tablet Commonly known as: ZOFRAN-ODT TAKE 1 TABLET BY MOUTH EVERY 8 HOURS AS NEEDED FOR NAUSEA AND VOMITING.         Objective:   BP 120/84   Pulse 95   Ht 5\' 7"  (1.702 m)   Wt 237 lb (107.5 kg)   SpO2 96%   BMI 37.12 kg/m   Wt Readings from Last 3 Encounters:  09/30/20 237 lb (107.5 kg)  07/08/20 243 lb 8 oz (110.5 kg)  02/17/20 254 lb (115.2 kg)    Physical Exam Vitals and nursing note reviewed.  Constitutional:      General: She is not in acute distress.     Appearance: She is well-developed. She is not diaphoretic.  Eyes:     Conjunctiva/sclera: Conjunctivae normal.  Cardiovascular:     Rate and Rhythm: Normal rate and regular rhythm.     Heart sounds: Normal heart sounds. No murmur heard. Pulmonary:     Effort: Pulmonary effort is normal. No respiratory distress.     Breath sounds: Normal breath sounds. No wheezing.  Musculoskeletal:        General: No tenderness. Normal range of motion.  Skin:    General: Skin is warm and dry.  Findings: No rash.  Neurological:     Mental Status: She is alert and oriented to person, place, and time.     Coordination: Coordination normal.  Psychiatric:        Behavior: Behavior normal.      Assessment & Plan:   Problem List Items Addressed This Visit       Respiratory   COPD (chronic obstructive pulmonary disease) (HCC) - Primary   Relevant Medications   albuterol (VENTOLIN HFA) 108 (90 Base) MCG/ACT inhaler   albuterol (PROVENTIL) (2.5 MG/3ML) 0.083% nebulizer solution   umeclidinium bromide (INCRUSE ELLIPTA) 62.5 MCG/INH AEPB   budesonide-formoterol (SYMBICORT) 160-4.5 MCG/ACT inhaler     Digestive   GERD (gastroesophageal reflux disease)   Relevant Medications   esomeprazole (NEXIUM) 40 MG capsule     Musculoskeletal and Integument   Osteoarthritis of right knee   Relevant Medications   ibuprofen (IBU) 800 MG tablet   diclofenac Sodium (VOLTAREN) 1 % GEL     Other   Anxiety and depression   Relevant Medications   amitriptyline (ELAVIL) 25 MG tablet   Other Visit Diagnoses     Postmenopausal       Relevant Orders   DG [facility] DEXA   Depression, recurrent (HCC)       Relevant Medications   amitriptyline (ELAVIL) 25 MG tablet       Patient seems to be doing well breast cancer, she is following up with oncology, no blood work today because they have taken blood work recently.  Follow-up in 6 months Follow up plan: Return in about 6 months (around 03/30/2021), or if  symptoms worsen or fail to improve, for COPD and GERD.  Counseling provided for all of the vaccine components Orders Placed This Encounter  Procedures   DG Buchanan Dorse Locy, MD [facility] 09/30/2020, 1:19 PM

## 2020-10-08 ENCOUNTER — Other Ambulatory Visit: Payer: Self-pay

## 2020-10-08 ENCOUNTER — Encounter (HOSPITAL_COMMUNITY)
Admission: RE | Admit: 2020-10-08 | Discharge: 2020-10-08 | Disposition: A | Discharge status: Home / Self Care | Payer: Medicare HMO | Source: Ambulatory Visit | Attending: Ophthalmology | Admitting: Ophthalmology

## 2020-10-08 NOTE — Pre-Procedure Instructions (Signed)
Left voicemail for her to call us back for preop phonecall.

## 2020-10-11 ENCOUNTER — Telehealth: Payer: Medicare HMO

## 2020-10-11 NOTE — Pre-Procedure Instructions (Signed)
Attempted to contact patient about surgery. I left a voicemail for her to call back.

## 2020-10-11 NOTE — Pre-Procedure Instructions (Signed)
Left voicemail to have her return call for preop phonecall.

## 2020-10-12 NOTE — H&P (Signed)
Surgical History & Physical  Patient Name: Tracy Neal DOB: 1955-09-24  Surgery: Cataract extraction with intraocular lens implant phacoemulsification; Right Eye  Surgeon: Baruch Goldmann MD Surgery Date:  10-15-2020 Pre-Op Date:  09-09-2020  HPI: A 19 Yr. old female patient Pt referred by Dr. Hassell Done for cataract evaluation. The patient complains of difficulty when driving due to glare from headlights or sun, which began 4 years ago. Both eyes are affected. OD>OS. The episode is gradual. The condition's severity is worsening. The complaint is associated with blurry/'cloudy vision, double vision and glare.Pt has d/c night driving due to symptoms. No eye drop use. Pt denies any increase in floaters/flashes of light or eye pain. HPI Completed by Dr. Baruch Goldmann  Medical History: Cataracts Macula Degeneration Glaucoma Anxiety disorders, Emphysema, Acid reflux h/... Arthritis Cancer Lung Problems  Review of Systems Negative Allergic/Immunologic Negative Cardiovascular Negative Constitutional Negative Ear, Nose, Mouth & Throat Negative Endocrine Negative Eyes Negative Gastrointestinal Negative Genitourinary Negative Hemotologic/Lymphatic Negative Integumentary Negative Musculoskeletal Negative Neurological Negative Psychiatry Negative Respiratory  Social   Current every day smoker   Medication Albuterol, Amitriptyline, Calcium, Celecoxib, Cyclobenzaprine, Diclofenac, Esomeprazole Magnesium, Pepcid, Hydrocodone, Hydroxyzine, Ibrance, Ibuprofen, Ellipta, Ondansetron, Paroxetine, Symbicort Inhalant Product,   Sx/Procedures Breast Cancer Sx, Gallbladder Sx,   Drug Allergies  Percocet,   History & Physical: Heent: Cataract, Right Eye NECK: supple without bruits LUNGS: lungs clear to auscultation CV: regular rate and rhythm Abdomen: soft and non-tender  Impression & Plan: Assessment: 1.  CATARACT HYPERMATURE (MORGAGNIAN) AGE RELATED; Right Eye (H25.21) 2.  COMBINED  FORMS AGE RELATED CATARACT; Left Eye (H25.812) 3.  BLEPHARITIS; Right Upper Lid, Right Lower Lid, Left Upper Lid, Left Lower Lid (H01.001, H01.002,H01.004,H01.005) 4.  Pinguecula; Both Eyes (H11.153) 5.  DERMATOCHALASIS; Right Upper Lid, Left Upper Lid (H02.831, F63.846)  Plan: 1.  Cataract accounts for the patient's decreased vision. This visual impairment is not correctable with a tolerable change in glasses or contact lenses. Cataract surgery with an implantation of a new lens should significantly improve the visual and functional status of the patient. Discussed all risks, benefits, alternatives, and potential complications. Discussed the procedures and recovery. Patient desires to have surgery. A-scan ordered and performed today for intra-ocular lens calculations. The surgery will be performed in order to improve vision for driving, reading, and for eye examinations. Recommend phacoemulsification with intra-ocular lens. Recommend Dextenza for post-operative pain and inflammation. Right Eye. mature. Dilates poorly - shugacaine by protocol. Vision [place]. mature. Omidira.  2.  Will address after Phaco PCIOL OD.  3.  Recommend regular lid cleaning.  4.  Observe; Artificial tears as needed for irritation.  5.  Asymptomatic, recommend observation for now. Findings, prognosis and treatment options reviewed.

## 2020-10-13 ENCOUNTER — Other Ambulatory Visit: Payer: Self-pay

## 2020-10-13 ENCOUNTER — Encounter (HOSPITAL_COMMUNITY): Payer: Self-pay

## 2020-10-13 NOTE — Pre-Procedure Instructions (Signed)
Attempted to contact patient about surgery preop information. No answer.

## 2020-10-15 ENCOUNTER — Ambulatory Visit (HOSPITAL_COMMUNITY): Payer: Medicare HMO | Admitting: Anesthesiology

## 2020-10-15 ENCOUNTER — Ambulatory Visit (HOSPITAL_COMMUNITY)
Admission: RE | Admit: 2020-10-15 | Discharge: 2020-10-15 | Disposition: A | Discharge status: Home / Self Care | Payer: Medicare HMO | Attending: Ophthalmology | Admitting: Ophthalmology

## 2020-10-15 ENCOUNTER — Encounter (HOSPITAL_COMMUNITY)
Admission: RE | Disposition: A | Discharge status: Home / Self Care | Payer: Self-pay | Source: Home / Self Care | Attending: Ophthalmology

## 2020-10-15 DIAGNOSIS — Z885 Allergy status to narcotic agent status: Secondary | ICD-10-CM | POA: Insufficient documentation

## 2020-10-15 DIAGNOSIS — I700 Atherosclerosis of aorta: Secondary | ICD-10-CM | POA: Diagnosis not present

## 2020-10-15 DIAGNOSIS — F17200 Nicotine dependence, unspecified, uncomplicated: Secondary | ICD-10-CM | POA: Diagnosis not present

## 2020-10-15 DIAGNOSIS — Z7951 Long term (current) use of inhaled steroids: Secondary | ICD-10-CM | POA: Diagnosis not present

## 2020-10-15 DIAGNOSIS — H2521 Age-related cataract, morgagnian type, right eye: Secondary | ICD-10-CM | POA: Diagnosis not present

## 2020-10-15 DIAGNOSIS — H3530 Unspecified macular degeneration: Secondary | ICD-10-CM | POA: Insufficient documentation

## 2020-10-15 DIAGNOSIS — Z79899 Other long term (current) drug therapy: Secondary | ICD-10-CM | POA: Diagnosis not present

## 2020-10-15 DIAGNOSIS — J439 Emphysema, unspecified: Secondary | ICD-10-CM | POA: Insufficient documentation

## 2020-10-15 DIAGNOSIS — Z791 Long term (current) use of non-steroidal anti-inflammatories (NSAID): Secondary | ICD-10-CM | POA: Insufficient documentation

## 2020-10-15 DIAGNOSIS — H409 Unspecified glaucoma: Secondary | ICD-10-CM | POA: Insufficient documentation

## 2020-10-15 HISTORY — PX: CATARACT EXTRACTION W/PHACO: SHX586

## 2020-10-15 SURGERY — PHACOEMULSIFICATION, CATARACT, WITH IOL INSERTION
Anesthesia: Monitor Anesthesia Care | Site: Eye | Laterality: Right

## 2020-10-15 MED ORDER — MIDAZOLAM HCL 2 MG/2ML IJ SOLN
INTRAMUSCULAR | Status: AC
  Filled 2020-10-15: qty 2

## 2020-10-15 MED ORDER — EPINEPHRINE PF 1 MG/ML IJ SOLN
INTRAOCULAR | Status: DC | PRN
  Administered 2020-10-15: 500 mL

## 2020-10-15 MED ORDER — BSS IO SOLN
INTRAOCULAR | Status: DC | PRN
  Administered 2020-10-15: 15 mL via INTRAOCULAR

## 2020-10-15 MED ORDER — LIDOCAINE HCL 3.5 % OP GEL
1.0000 "application " | Freq: Once | OPHTHALMIC | Status: AC
  Administered 2020-10-15: 1 via OPHTHALMIC

## 2020-10-15 MED ORDER — SODIUM HYALURONATE 23MG/ML IO SOSY
PREFILLED_SYRINGE | INTRAOCULAR | Status: DC | PRN
  Administered 2020-10-15: 0.6 mL via INTRAOCULAR

## 2020-10-15 MED ORDER — TRYPAN BLUE 0.06 % OP SOLN
OPHTHALMIC | Status: DC | PRN
  Administered 2020-10-15: 0.5 mL via INTRAOCULAR

## 2020-10-15 MED ORDER — SODIUM HYALURONATE 10 MG/ML IO SOLUTION
PREFILLED_SYRINGE | INTRAOCULAR | Status: DC | PRN
  Administered 2020-10-15: 0.85 mL via INTRAOCULAR

## 2020-10-15 MED ORDER — TETRACAINE HCL 0.5 % OP SOLN
1.0000 [drp] | OPHTHALMIC | Status: AC | PRN
  Administered 2020-10-15 (×3): 1 [drp] via OPHTHALMIC

## 2020-10-15 MED ORDER — SODIUM CHLORIDE (PF) 0.9 % IJ SOLN
INTRAMUSCULAR | Status: AC
  Filled 2020-10-15: qty 10

## 2020-10-15 MED ORDER — POVIDONE-IODINE 5 % OP SOLN
OPHTHALMIC | Status: DC | PRN
  Administered 2020-10-15: 1 via OPHTHALMIC

## 2020-10-15 MED ORDER — LIDOCAINE HCL (PF) 1 % IJ SOLN
INTRAOCULAR | Status: DC | PRN
  Administered 2020-10-15: 1 mL via OPHTHALMIC

## 2020-10-15 MED ORDER — EPINEPHRINE PF 1 MG/ML IJ SOLN
INTRAMUSCULAR | Status: AC
  Filled 2020-10-15: qty 1

## 2020-10-15 MED ORDER — MIDAZOLAM HCL 2 MG/2ML IJ SOLN
INTRAMUSCULAR | Status: DC | PRN
  Administered 2020-10-15 (×2): 1 mg via INTRAVENOUS

## 2020-10-15 MED ORDER — TRYPAN BLUE 0.06 % OP SOLN
OPHTHALMIC | Status: AC
  Filled 2020-10-15: qty 0.5

## 2020-10-15 MED ORDER — SODIUM CHLORIDE 0.9% FLUSH
INTRAVENOUS | Status: DC | PRN
  Administered 2020-10-15 (×2): 5 mL via INTRAVENOUS

## 2020-10-15 MED ORDER — PHENYLEPHRINE HCL 2.5 % OP SOLN
1.0000 [drp] | OPHTHALMIC | Status: AC | PRN
  Administered 2020-10-15 (×3): 1 [drp] via OPHTHALMIC

## 2020-10-15 MED ORDER — FENTANYL CITRATE PF 50 MCG/ML IJ SOSY: 50.0000 ug | PREFILLED_SYRINGE | Freq: Once | INTRAMUSCULAR | Status: AC

## 2020-10-15 MED ORDER — NEOMYCIN-POLYMYXIN-DEXAMETH 3.5-10000-0.1 OP SUSP
OPHTHALMIC | Status: DC | PRN
  Administered 2020-10-15: 1 [drp] via OPHTHALMIC

## 2020-10-15 MED ORDER — STERILE WATER FOR IRRIGATION IR SOLN
Status: DC | PRN
  Administered 2020-10-15: 250 mL

## 2020-10-15 MED ORDER — PHENYLEPHRINE-KETOROLAC 1-0.3 % IO SOLN
INTRAOCULAR | Status: AC
  Filled 2020-10-15: qty 4

## 2020-10-15 MED ORDER — FENTANYL CITRATE PF 50 MCG/ML IJ SOSY
PREFILLED_SYRINGE | INTRAMUSCULAR | Status: AC
  Administered 2020-10-15: 50 ug via INTRAVENOUS
  Filled 2020-10-15: qty 1

## 2020-10-15 MED ORDER — TROPICAMIDE 1 % OP SOLN
1.0000 [drp] | OPHTHALMIC | Status: AC
  Administered 2020-10-15 (×3): 1 [drp] via OPHTHALMIC

## 2020-10-15 SURGICAL SUPPLY — 11 items
CLOTH BEACON ORANGE TIMEOUT ST (SAFETY) ×2 IMPLANT
EYE SHIELD UNIVERSAL CLEAR (GAUZE/BANDAGES/DRESSINGS) ×2 IMPLANT
GLOVE SURG UNDER POLY LF SZ6.5 (GLOVE) ×2 IMPLANT
GLOVE SURG UNDER POLY LF SZ7 (GLOVE) ×2 IMPLANT
NEEDLE HYPO 18GX1.5 BLUNT FILL (NEEDLE) ×2 IMPLANT
PAD ARMBOARD 7.5X6 YLW CONV (MISCELLANEOUS) ×2 IMPLANT
RayOne EMV Us Intraocular Lens (Intraocular Lens) ×2 IMPLANT
SYR TB 1ML LL NO SAFETY (SYRINGE) ×2 IMPLANT
TAPE SURG TRANSPORE 1 IN (GAUZE/BANDAGES/DRESSINGS) ×1 IMPLANT
TAPE SURGICAL TRANSPORE 1 IN (GAUZE/BANDAGES/DRESSINGS) ×1
WATER STERILE IRR 250ML POUR (IV SOLUTION) ×2 IMPLANT

## 2020-10-15 NOTE — Interval H&P Note (Signed)
History and Physical Interval Note:  10/15/2020 10:13 AM  Tracy Neal  has presented today for surgery, with the diagnosis of Nuclear sclerotic cataract - Right eye.  The various methods of treatment have been discussed with the patient and family. After consideration of risks, benefits and other options for treatment, the patient has consented to  Procedure(s) with comments: CATARACT EXTRACTION PHACO AND INTRAOCULAR LENS PLACEMENT (IOC) (Right) - right - pt would like arrival 9 - 10 if possible as a surgical intervention.  The patient's history has been reviewed, patient examined, no change in status, stable for surgery.  I have reviewed the patient's chart and labs.  Questions were answered to the patient's satisfaction.     Baruch Goldmann

## 2020-10-15 NOTE — Anesthesia Preprocedure Evaluation (Signed)
Anesthesia Evaluation  Patient identified by MRN, date of birth, ID band Patient awake    Reviewed: Allergy & Precautions, NPO status , Patient's Chart, lab work & pertinent test results  History of Anesthesia Complications Negative for: history of anesthetic complications  Airway Mallampati: II  TM Distance: >3 FB Neck ROM: Full    Dental  (+) Edentulous Upper, Missing, Dental Advisory Given   Pulmonary shortness of breath and with exertion, COPD,  COPD inhaler, Current Smoker and Patient abstained from smoking.,    Pulmonary exam normal breath sounds clear to auscultation       Cardiovascular Exercise Tolerance: Poor Normal cardiovascular exam Rhythm:Regular Rate:Normal  08-Aug-2019 10:50:27 [facility] ROUTINE RECORD Sinus rhythm Abnormal R-wave progression, early transition   Neuro/Psych PSYCHIATRIC DISORDERS Anxiety Depression    GI/Hepatic GERD  Medicated and Controlled,(+)     substance abuse (last use - long time ago)  cocaine use,   Endo/Other  negative endocrine ROS  Renal/GU negative Renal ROS     Musculoskeletal  (+) Arthritis , Osteoarthritis,    Abdominal   Peds  Hematology negative hematology ROS (+)   Anesthesia Other Findings Metastatic right Breast cancer Chronic pain syndrome IMPRESSION: 1. Mildly motion degraded exam. 2. Response to therapy of pulmonary and thoracic nodal metastasis. 3. Evolution of widespread osseous metastasis, likely indicative of response to therapy. Healing of pathologic fractures with new sclerosis and osseous heterogeneity as detailed above. 4. No progressive soft tissue metastasis. 5.  Tiny hiatal hernia. 6. Decreased left adrenal nodularity, favoring response to therapy of adrenal metastasis. 7. Aortic atherosclerosis (ICD10-I70.0) and emphysema (ICD10-J43  Reproductive/Obstetrics negative OB ROS                              Anesthesia Physical  Anesthesia Plan  ASA: 3  Anesthesia Plan: MAC   Post-op Pain Management:    Induction:   PONV Risk Score and Plan:   Airway Management Planned: Nasal Cannula and Natural Airway  Additional Equipment:   Intra-op Plan:   Post-operative Plan:   Informed Consent: I have reviewed the patients History and Physical, chart, labs and discussed the procedure including the risks, benefits and alternatives for the proposed anesthesia with the patient or authorized representative who has indicated his/her understanding and acceptance.     Dental advisory given  Plan Discussed with: CRNA and Surgeon  Anesthesia Plan Comments:         Anesthesia Quick Evaluation

## 2020-10-15 NOTE — Op Note (Signed)
Date of procedure: 10/15/20  Pre-operative diagnosis: Mature Visually significant age-related cataract, Right Eye (H25.21)  Post-operative diagnosis: Mature Visually significant age-related cataract, Right Eye  Procedure: Complex Removal of cataract via phacoemulsification and insertion of intra-ocular lens Rayner [id number] +19.5D into the capsular bag of the Right Eye  Attending surgeon: Gerda Diss. Alaura Schippers, MD, MA  Anesthesia: MAC, Topical [place]  Complications: None  Estimated Blood Loss: <61m (minimal)  Specimens: None  Implants: As above  Indications:  Mature Visually significant age-related cataract, Right Eye  Procedure:  The patient was seen and identified in the pre-operative area. The operative eye was identified and dilated.  The operative eye was marked.  Topical anesthesia was administered to the operative eye.     The patient was then to the operative suite and placed in the supine position.  A timeout was performed confirming the patient, procedure to be performed, and all other relevant information.   The patient's face was prepped and draped in the usual fashion for intra-ocular surgery.  A lid speculum was placed into the operative eye and the surgical microscope moved into place and focused.  A lack of red reflex due to a mature cataract was confirmed.  A superotemporal paracentesis was created using a 20 gauge paracentesis blade.  Vision blue was injected into the anterior chamber.  Shugarcaine was injected into the anterior chamber.  Viscoelastic was injected into the anterior chamber.  A temporal clear-corneal main wound incision was created using a 2.466mmicrokeratome.  A continuous curvilinear capsulorrhexis was initiated using an irrigating cystitome and completed using capsulorrhexis forceps.  Hydrodissection and hydrodeliniation were performed.  Viscoelastic was injected into the anterior chamber.  A phacoemulsification handpiece and a chopper as a second instrument were  used to remove the nucleus and epinucleus. The irrigation/aspiration handpiece was used to remove any remaining cortical material.   The capsular bag was reinflated with viscoelastic, checked, and found to be intact. The intraocular lens was inserted into the capsular bag and dialed into place using a kuglen hook.  The irrigation/aspiration handpiece was used to remove any remaining viscoelastic.  The clear corneal wound and paracentesis wounds were then hydrated and checked with Weck-Cels to be watertight.  The lid-speculum and drape was removed, and the patient's face was cleaned with a wet and dry 4x4.  Maxitrol was instilled in the eye before a clear shield was taped over the eye. The patient was taken to the post-operative care unit in good condition, having tolerated the procedure well.  Post-Op Instructions: [facility] and will receive all other orders and instructions.

## 2020-10-15 NOTE — Anesthesia Postprocedure Evaluation (Signed)
Anesthesia Post Note  Patient: Tracy Neal  Procedure(s) Performed: CATARACT EXTRACTION PHACO AND INTRAOCULAR LENS PLACEMENT RIGHT EYE (Right: Eye)  Patient location during evaluation: Phase II Anesthesia Type: MAC Level of consciousness: awake and alert and oriented Pain management: pain level controlled Vital Signs Assessment: post-procedure vital signs reviewed and stable Respiratory status: spontaneous breathing and respiratory function stable Cardiovascular status: blood pressure returned to baseline and stable Postop Assessment: no apparent nausea or vomiting Anesthetic complications: no   No notable events documented.   Last Vitals:  Vitals:   10/15/20 0945 10/15/20 1043  BP: 112/82 (!) 117/97  Pulse: 71   Resp: 18 20  Temp: 36.7 C 36.4 C  SpO2: 100% 99%    Last Pain:  Vitals:   10/15/20 1043  TempSrc: Axillary  PainSc: 0-No pain                 [name] C Aariv Medlock

## 2020-10-15 NOTE — Transfer of Care (Signed)
Immediate Anesthesia Transfer of Care Note  Patient: Tracy Neal  Procedure(s) Performed: CATARACT EXTRACTION PHACO AND INTRAOCULAR LENS PLACEMENT RIGHT EYE (Right: Eye)  Patient Location: Short Stay  Anesthesia Type:MAC  Level of Consciousness: awake, alert  and oriented  Airway & Oxygen Therapy: Patient Spontanous Breathing  Post-op Assessment: Report given to RN and Post -op Vital signs reviewed and stable  Post vital signs: Reviewed and stable  Last Vitals:  Vitals Value Taken Time  BP    Temp    Pulse    Resp    SpO2      Last Pain:  Vitals:   10/15/20 0945  TempSrc: Oral  PainSc: 5       Patients Stated Pain Goal: 8 (08/67/61 9509)  Complications: No notable events documented.

## 2020-10-15 NOTE — Anesthesia Procedure Notes (Signed)
Procedure Name: MAC Date/Time: 10/15/2020 10:22 AM Performed by: Orlie Dakin, CRNA Pre-anesthesia Checklist: Patient identified, Emergency Drugs available, Suction available and Patient being monitored Patient Re-evaluated:Patient Re-evaluated prior to induction Oxygen Delivery Method: Nasal cannula Placement Confirmation: positive ETCO2

## 2020-10-15 NOTE — Discharge Instructions (Addendum)
Please discharge patient when stable, will follow up today with Dr. Wrzosek at the [facility] [place] office immediately following discharge.  Leave shield in place until visit.  All paperwork with discharge instructions will be given at the office.  [facility] Address:  [address]  [place], [place] [address]  

## 2020-10-18 ENCOUNTER — Encounter (HOSPITAL_COMMUNITY): Payer: Self-pay | Admitting: Ophthalmology

## 2020-10-28 ENCOUNTER — Encounter (HOSPITAL_COMMUNITY)
Admission: RE | Admit: 2020-10-28 | Discharge: 2020-10-28 | Disposition: A | Discharge status: Home / Self Care | Payer: Medicare HMO | Source: Ambulatory Visit | Attending: Ophthalmology | Admitting: Ophthalmology

## 2020-10-28 ENCOUNTER — Other Ambulatory Visit: Payer: Self-pay

## 2020-11-04 ENCOUNTER — Encounter (HOSPITAL_COMMUNITY): Admission: RE | Payer: Self-pay | Source: Home / Self Care

## 2020-11-04 ENCOUNTER — Ambulatory Visit (HOSPITAL_COMMUNITY): Admission: RE | Admit: 2020-11-04 | Payer: Medicare HMO | Source: Home / Self Care | Admitting: Ophthalmology

## 2020-11-04 SURGERY — PHACOEMULSIFICATION, CATARACT, WITH IOL INSERTION
Anesthesia: Monitor Anesthesia Care | Laterality: Left

## 2020-11-25 ENCOUNTER — Telehealth: Payer: Medicare HMO

## 2020-12-21 ENCOUNTER — Telehealth: Payer: Self-pay | Admitting: Family Medicine

## 2021-01-11 ENCOUNTER — Telehealth: Payer: Self-pay | Admitting: Family Medicine

## 2021-01-11 DIAGNOSIS — J439 Emphysema, unspecified: Secondary | ICD-10-CM

## 2021-01-11 NOTE — Telephone Encounter (Signed)
Pt needs a hose and mask to go on her breathing machine. The pharmacy is unable to give her another without a prescription.

## 2021-01-11 NOTE — Telephone Encounter (Signed)
Please sign pending order attached to this encounter

## 2021-01-12 NOTE — Telephone Encounter (Signed)
Lmtcb to see if pt wanted to pick up Rx or have it faxed somewhere

## 2021-01-13 NOTE — Telephone Encounter (Signed)
Pt would like Rx for supplies faxed to [facility]. Fax sent.

## 2021-01-24 DIAGNOSIS — Z5111 Encounter for antineoplastic chemotherapy: Secondary | ICD-10-CM | POA: Diagnosis not present

## 2021-01-24 DIAGNOSIS — C50919 Malignant neoplasm of unspecified site of unspecified female breast: Secondary | ICD-10-CM | POA: Diagnosis not present

## 2021-01-24 DIAGNOSIS — C50111 Malignant neoplasm of central portion of right female breast: Secondary | ICD-10-CM | POA: Diagnosis not present

## 2021-01-24 DIAGNOSIS — Z170 Estrogen receptor positive status [ER+]: Secondary | ICD-10-CM | POA: Diagnosis not present

## 2021-01-24 DIAGNOSIS — C7800 Secondary malignant neoplasm of unspecified lung: Secondary | ICD-10-CM | POA: Diagnosis not present

## 2021-01-24 DIAGNOSIS — Z09 Encounter for follow-up examination after completed treatment for conditions other than malignant neoplasm: Secondary | ICD-10-CM | POA: Diagnosis not present

## 2021-01-25 DIAGNOSIS — Z5111 Encounter for antineoplastic chemotherapy: Secondary | ICD-10-CM | POA: Diagnosis not present

## 2021-01-25 DIAGNOSIS — C7951 Secondary malignant neoplasm of bone: Secondary | ICD-10-CM | POA: Diagnosis not present

## 2021-01-25 DIAGNOSIS — Z5112 Encounter for antineoplastic immunotherapy: Secondary | ICD-10-CM | POA: Diagnosis not present

## 2021-01-25 DIAGNOSIS — C50919 Malignant neoplasm of unspecified site of unspecified female breast: Secondary | ICD-10-CM | POA: Diagnosis not present

## 2021-02-08 ENCOUNTER — Encounter (HOSPITAL_COMMUNITY): Payer: Self-pay | Admitting: Hematology

## 2021-02-08 ENCOUNTER — Telehealth: Payer: Self-pay | Admitting: Family Medicine

## 2021-02-08 NOTE — Telephone Encounter (Signed)
°  Left message for patient to call back and schedule Medicare Annual Wellness Visit (AWV) to be completed by video or phone.  No hx of AWV eligible for AWVI as of  01/16/2021 per [employer]   Please schedule at anytime with [facility] --- Karle Starch  45 Minutes appointment   Any questions, please call me at [phone]

## 2021-02-09 ENCOUNTER — Telehealth: Payer: Self-pay | Admitting: Family Medicine

## 2021-02-09 NOTE — Telephone Encounter (Signed)
LMOVM order will need a F2F visit with a provider, her appt on Friday is a telephone visit with the nurse for a AWV, please call the office to schedule an appt

## 2021-02-09 NOTE — Telephone Encounter (Signed)
°  Prescription Request  02/09/2021  Is this a "Controlled Substance" medicine? no  Have you seen your PCP in the last 2 weeks? Has appt friday  If YES, route message to pool  -  If NO, patient needs to be scheduled for appointment.  What is the name of the medication or equipment? Potty chair  Have you contacted your pharmacy to request a refill? no   Which pharmacy would you like this sent to? [facility]   Patient notified that their request is being sent to the clinical staff for review and that they should receive a response within 2 business days.

## 2021-02-10 ENCOUNTER — Encounter (HOSPITAL_COMMUNITY): Payer: Self-pay | Admitting: Hematology

## 2021-02-11 ENCOUNTER — Ambulatory Visit (INDEPENDENT_AMBULATORY_CARE_PROVIDER_SITE_OTHER): Payer: Commercial Managed Care - HMO

## 2021-02-11 VITALS — Ht 67.0 in | Wt 245.0 lb

## 2021-02-11 DIAGNOSIS — Z0000 Encounter for general adult medical examination without abnormal findings: Secondary | ICD-10-CM

## 2021-02-11 NOTE — Progress Notes (Signed)
Subjective:   Tracy Neal is a 66 y.o. female who presents for an Initial Medicare Annual Wellness Visit.  Virtual Visit via Telephone Note  I connected with  Tracy Neal on 02/11/21 at 10:30 AM EST by telephone and verified that I am speaking with the correct person using two identifiers.  Location: Patient: Home Provider: [facility] Persons participating in the virtual visit: patient/Nurse Health Advisor   I discussed the limitations, risks, security and privacy concerns of performing an evaluation and management service by telephone and the availability of in person appointments. The patient expressed understanding and agreed to proceed.  Interactive audio and video telecommunications were attempted between this nurse and patient, however failed, due to patient having technical difficulties OR patient did not have access to video capability.  We continued and completed visit with audio only.  Some vital signs may be absent or patient reported.   Tracy Tangeman E Brylea Pita, LPN   Review of Systems     Cardiac Risk Factors include: advanced age (>14men, >10 women);sedentary lifestyle;obesity (BMI >30kg/m2);Other (see comment);smoking/ tobacco exposure;family history of premature cardiovascular disease, Risk factor comments: COPD, mets cancer     Objective:    Today's Vitals   02/11/21 1036  Weight: 245 lb (111.1 kg)  Height: 5\' 7"  (1.702 m)  PainSc: 2    Body mass index is 38.37 kg/m.  Advanced Directives 02/11/2021 10/13/2020 03/17/2020 02/17/2020 12/16/2019 09/24/2019 09/17/2019  Does Patient Have a Medical Advance Directive? No No No No No No No  Would patient like information on creating a medical advance directive? No - Patient declined No - Patient declined No - Patient declined No - Patient declined No - Patient declined No - Patient declined No - Patient declined    Current Medications (verified) Outpatient Encounter Medications as of 02/11/2021  Medication Sig   albuterol  (PROVENTIL) (2.5 MG/3ML) 0.083% nebulizer solution Take 3 mLs (2.5 mg total) by nebulization every 4 (four) hours as needed for wheezing or shortness of breath.   albuterol (VENTOLIN HFA) 108 (90 Base) MCG/ACT inhaler Inhale 1-2 puffs into the lungs every 4 (four) hours as needed for wheezing or shortness of breath.   amitriptyline (ELAVIL) 25 MG tablet TAKE 1 TO 2 TABLETS AT BEDTIME. (Patient taking differently: Take 50 mg by mouth at bedtime.)   budesonide-formoterol (SYMBICORT) 160-4.5 MCG/ACT inhaler INHALE 2 PUFFS INTO THE LUNGS 2 TIMES A DAY. (Patient taking differently: Inhale 2 puffs into the lungs in the morning.)   Calcium-Phosphorus-Vitamin D (CALCIUM GUMMIES PO) Take 1 tablet by mouth in the morning and at bedtime.   diclofenac Sodium (VOLTAREN) 1 % GEL Apply 2 g topically 4 (four) times daily. (Patient taking differently: Apply 2 g topically 4 (four) times daily as needed (pain.).)   esomeprazole (NEXIUM) 40 MG capsule Take 1 capsule (40 mg total) by mouth daily.   HYDROcodone-acetaminophen (NORCO) 7.5-325 MG tablet Take 1 tablet by mouth daily as needed.   IBRANCE 125 MG capsule Take 125 mg by mouth See admin instructions. TAKE 1 TABLET (125 MG TOTAL) BY MOUTH DAILY IN THE EVENING. TAKE FOR 21 DAYS ON, 7 DAYS OFF, REPEAT EVERY 28 DAYS   ibuprofen (IBU) 800 MG tablet TAKE (1) TABLET EVERY EIGHT HOURS AS NEEDED.   ondansetron (ZOFRAN-ODT) 8 MG disintegrating tablet TAKE 1 TABLET BY MOUTH EVERY 8 HOURS AS NEEDED FOR NAUSEA AND VOMITING.   umeclidinium bromide (INCRUSE ELLIPTA) 62.5 MCG/INH AEPB Inhale 2 puffs into the lungs daily.   vitamin B-12 (CYANOCOBALAMIN)  1000 MCG tablet Take 1,000 mcg by mouth daily.   Calcium Carb-Cholecalciferol (CALCIUM+D3 PO) Take 1 tablet by mouth in the morning. (Patient not taking: Reported on 02/11/2021)   celecoxib (CELEBREX) 200 MG capsule Take 200 mg by mouth in the morning. (Patient not taking: Reported on 02/11/2021)   ILEVRO 0.3 % ophthalmic suspension  Place 1 drop into the right eye as directed. (Patient not taking: Reported on 02/11/2021)   moxifloxacin (VIGAMOX) 0.5 % ophthalmic solution Apply 1 drop to eye as directed. (Patient not taking: Reported on 02/11/2021)   PARoxetine (PAXIL) 20 MG tablet Take 20 mg by mouth daily. (Patient not taking: Reported on 02/11/2021)   prednisoLONE acetate (PRED FORTE) 1 % ophthalmic suspension Place 1 drop into the right eye as directed. (Patient not taking: Reported on 02/11/2021)   Facility-Administered Encounter Medications as of 02/11/2021  Medication   methylPREDNISolone acetate (DEPO-MEDROL) injection 80 mg    Allergies (verified) Percocet [oxycodone-acetaminophen]   History: Past Medical History:  Diagnosis Date   Anxiety    Arthritis    Basal cell carcinoma    not biopsy confirmed; Right bridge of nose   Breast cancer (HCC)    COPD (chronic obstructive pulmonary disease) (HCC)    COPD, moderate (HCC)    Depression    GERD (gastroesophageal reflux disease)    Shortness of breath dyspnea    Umbilical hernia    Past Surgical History:  Procedure Laterality Date   BREAST LUMPECTOMY Right 07/15/2012   Procedure: BREAST LUMPECTOMY WITH AXILLARY LYMPH NODE BIOPSY;  Surgeon: Harl Bowie, MD;  Location: [facility];  Service: General;  Laterality: Right;  Nuclear medicine injection 9:30    CATARACT EXTRACTION W/PHACO Right 10/15/2020   Procedure: CATARACT EXTRACTION PHACO AND INTRAOCULAR LENS PLACEMENT RIGHT EYE;  Surgeon: Baruch Goldmann, MD;  Location: [facility];  Service: Ophthalmology;  Laterality: Right;  CDE=32.88   CHOLECYSTECTOMY     CHONDROPLASTY  08/07/2019   Procedure: CHONDROPLASTY MEDIAL CONDYAL;  Surgeon: Carole Civil, MD;  Location: [facility];  Service: Orthopedics;;   COLONOSCOPY N/A 03/16/2015   Procedure: COLONOSCOPY;  Surgeon: Danie Binder, MD;  Location: [facility];  Service: Endoscopy;  Laterality: N/A;  2:30 PM - moved to 12:30 - office to notify pt   KNEE ARTHROSCOPY  WITH MEDIAL MENISECTOMY Right 08/07/2019   Procedure: KNEE ARTHROSCOPY WITH MEDIAL MENISECTOMY;  Surgeon: Carole Civil, MD;  Location: [facility];  Service: Orthopedics;  Laterality: Right;   TUBAL LIGATION     Family History  Problem Relation Age of Onset   Heart disease Mother    Cancer Paternal Aunt    Heart attack Sister    Social History   Socioeconomic History   Marital status: Divorced    Spouse name: Not on file   Number of children: 1   Years of education: Not on file   Highest education level: Not on file  Occupational History   Occupation: disability  Tobacco Use   Smoking status: Some Days    Packs/day: 0.25    Years: 40.00    Pack years: 10.00    Types: Cigarettes   Smokeless tobacco: Never  Vaping Use   Vaping Use: Never used  Substance and Sexual Activity   Alcohol use: No   Drug use: No   Sexual activity: Not Currently    Birth control/protection: None  Other Topics Concern   Not on file  Social History Narrative   ** Merged History Encounter **  Has one son - doesn't hear from him a lot   She lives with 2 friends and their adopted children   Social Determinants of Radio broadcast assistant Strain: Low Risk    Difficulty of Paying Living Expenses: Not hard at all  Food Insecurity: No Food Insecurity   Worried About Charity fundraiser in the Last Year: Never true   Arboriculturist in the Last Year: Never true  Transportation Needs: No Transportation Needs   Lack of Transportation (Medical): No   Lack of Transportation (Non-Medical): No  Physical Activity: Insufficiently Active   Days of Exercise per Week: 5 days   Minutes of Exercise per Session: 10 min  Stress: No Stress Concern Present   Feeling of Stress : Only a little  Social Connections: Socially Isolated   Frequency of Communication with Friends and Family: More than three times a week   Frequency of Social Gatherings with Friends and Family: Once a week   Attends Religious  Services: Never   Marine scientist or Organizations: No   Attends Music therapist: Never   Marital Status: Divorced    Tobacco Counseling Ready to quit: Not Answered Counseling given: Not Answered   Clinical Intake:  Pre-visit preparation completed: Yes  Pain : 0-10 Pain Score: 2  Pain Type: Chronic pain Pain Location: Generalized Pain Descriptors / Indicators: Aching, Discomfort, Sore Pain Onset: More than a month ago Pain Frequency: Intermittent     BMI - recorded: 38.37 Nutritional Status: BMI > 30  Obese Nutritional Risks: None Diabetes: No  How often do you need to have someone help you when you read instructions, pamphlets, or other written materials from your doctor or pharmacy?: 1 - Never  Diabetic? no  Interpreter Needed?: No  Information entered by :: Joice Nazario, LPN   Activities of Daily Living In your present state of health, do you have any difficulty performing the following activities: 02/11/2021 10/13/2020  Hearing? N Y  Vision? N Y  Difficulty concentrating or making decisions? Y N  Walking or climbing stairs? Y Y  Dressing or bathing? Y N  Doing errands, shopping? Tempie Donning  Preparing Food and eating ? Y -  Using the Toilet? Y -  In the past six months, have you accidently leaked urine? Y -  Do you have problems with loss of bowel control? N -  Managing your Medications? N -  Managing your Finances? N -  Housekeeping or managing your Housekeeping? Y -  Some recent data might be hidden    Patient Care Team: [name], Fransisca Kaufmann, MD as PCP - General (Family Medicine) Ilean China, RN as Case Manager Katha Cabal, LCSW as Social Worker (Licensed Clinical Social Worker) Oletta Lamas, Garwin Brothers, RN as Oncology Nurse Navigator (Oncology) Donetta Potts, RN as Oncology Nurse Navigator (Oncology)  Indicate any recent Medical Services you may have received from other than [facility] providers in the past year (date may be  approximate).     Assessment:   This is a routine wellness examination for Tracy Neal.  Hearing/Vision screen Hearing Screening - Comments:: Denies hearing difficulties   Vision Screening - Comments:: Wears reading glasses prn only - up to date with annual eye exams with [facility]  Dietary issues and exercise activities discussed: Current Exercise Habits: The patient does not participate in regular exercise at present, Exercise limited by: respiratory conditions(s)   Goals Addressed  This Visit's Progress     Client will talk with LCSW in next 30 days about housing issues of client (pt-stated)   On track     CARE PLAN ENTRY   Current Barriers:  Housing challenges with client with chronic diagnoses of Osteopenia, COPD, Osteoporosis, OA, DDD, GERD, Anxiety ad Depression Breast Cancer   Clinical Social Work Clinical Goal(s):  LCSW to call client in next 30 days to discuss housing needs of client  Interventions:  Talked with client about CCM support Talked with client about social support network Talked with client about sleeping challenges of client Talked with client about upcoming medical appointments  Talked with client about pain issues of client Talked with clinet about relaxation techniques (enjoys taking with family or friends, listens to listen to music, sits on porch to relax) Talked with client about anxiety issues of client  Patient Self Care Activities:   Drives car to needed appointments and to do errands  Patient Self Care Deficits: Housing challenges  Initial goal documentation        Depression Screen PHQ 2/9 Scores 02/11/2021 09/30/2020 07/08/2020 10/02/2019 06/06/2019 05/27/2019 03/20/2019  PHQ - 2 Score 1 2 0 2 2 6 4   PHQ- 9 Score - 8 0 - 4 24 16     Fall Risk Fall Risk  02/11/2021 09/30/2020 07/08/2020 10/02/2019 05/26/2019  Falls in the past year? 1 0 0 1 1  Number falls in past yr: 1 - - 1 1  Injury with Fall? 0 - - 0 1  Risk for  fall due to : History of fall(s);Impaired balance/gait;Impaired mobility;Medication side effect;Orthopedic patient;Mental status change - - Impaired balance/gait;History of fall(s) Impaired balance/gait  Follow up Education provided;Falls prevention discussed - Falls evaluation completed Falls evaluation completed Falls evaluation completed    FALL RISK PREVENTION PERTAINING TO THE HOME:  Any stairs in or around the home? Yes  If so, are there any without handrails? No  Home free of loose throw rugs in walkways, pet beds, electrical cords, etc? Yes  Adequate lighting in your home to reduce risk of falls? Yes   ASSISTIVE DEVICES UTILIZED TO PREVENT FALLS:  Life alert? No  Use of a cane, walker or w/c? Yes  Grab bars in the bathroom? Yes  Shower chair or bench in shower? Yes  Elevated toilet seat or a handicapped toilet? Yes   TIMED UP AND GO:  Was the test performed? No . Telephonic visit  Cognitive Function:     6CIT Screen 02/11/2021  What Year? 4 points  What month? 3 points  What time? 0 points  Count back from 20 0 points  Months in reverse 4 points  Repeat phrase 2 points  Total Score 13    Immunizations Immunization History  Administered Date(s) Administered   Fluad Quad(high Dose 65+) 09/30/2020   Influenza,inj,quad, With Preservative 02/28/2018    TDAP status: Due, Education has been provided regarding the importance of this vaccine. Advised may receive this vaccine at local pharmacy or Health Dept. Aware to provide a copy of the vaccination record if obtained from local pharmacy or Health Dept. Verbalized acceptance and understanding.  Flu Vaccine status: Up to date  Pneumococcal vaccine status: Due, Education has been provided regarding the importance of this vaccine. Advised may receive this vaccine at local pharmacy or Health Dept. Aware to provide a copy of the vaccination record if obtained from local pharmacy or Health Dept. Verbalized acceptance and  understanding.  Covid-19 vaccine status: Information provided on  how to obtain vaccines.   Qualifies for Shingles Vaccine? Yes   Zostavax completed No   Shingrix Completed?: No.    Education has been provided regarding the importance of this vaccine. Patient has been advised to call insurance company to determine out of pocket expense if they have not yet received this vaccine. Advised may also receive vaccine at local pharmacy or Health Dept. Verbalized acceptance and understanding.  Screening Tests Health Maintenance  Topic Date Due   Pneumonia Vaccine 53+ Years old (1 - PCV) Never done   Zoster Vaccines- Shingrix (1 of 2) Never done   MAMMOGRAM  03/29/2017   COVID-19 Vaccine (1) 02/28/2021 (Originally 07/09/1956)   TETANUS/TDAP  09/30/2021 (Originally 01/07/1975)   DEXA SCAN  10/07/2022   COLONOSCOPY (Pts 45-23yrs Insurance coverage will need to be confirmed)  03/15/2025   Hepatitis C Screening  Completed   HPV VACCINES  Aged Out   INFLUENZA VACCINE  Discontinued    Health Maintenance  Health Maintenance Due  Topic Date Due   Pneumonia Vaccine 82+ Years old (1 - PCV) Never done   Zoster Vaccines- Shingrix (1 of 2) Never done   MAMMOGRAM  03/29/2017    Colorectal cancer screening: No longer required.   Mammogram status: No longer required due to stage 4 cancer with mets - she doesn't want to do anything unnecessary .  Bone Density status: Completed 10/06/2020. Results reflect: Bone density results: OSTEOPOROSIS. Repeat every 2 years.  Lung Cancer Screening: (Low Dose CT Chest recommended if Age 68-80 years, 30 pack-year currently smoking OR have quit w/in 15years.) does not qualify. Mets cancer  Additional Screening:  Hepatitis C Screening: does qualify; Completed 06/09/2016  Vision Screening: Recommended annual ophthalmology exams for early detection of glaucoma and other disorders of the eye. Is the patient up to date with their annual eye exam?  Yes  Who is the  provider or what is the name of the office in which the patient attends annual eye exams? [facility] If pt is not established with a provider, would they like to be referred to a provider to establish care? No .   Dental Screening: Recommended annual dental exams for proper oral hygiene  Community Resource Referral / Chronic Care Management: CRR required this visit?  No   CCM required this visit?  No      Plan:     I have personally reviewed and noted the following in the patients chart:   Medical and social history Use of alcohol, tobacco or illicit drugs  Current medications and supplements including opioid prescriptions. Patient is currently taking opioid prescriptions. Information provided to patient regarding non-opioid alternatives. Patient advised to discuss non-opioid treatment plan with their provider. Functional ability and status Nutritional status Physical activity Advanced directives List of other physicians Hospitalizations, surgeries, and ER visits in previous 12 months Vitals Screenings to include cognitive, depression, and falls Referrals and appointments  In addition, I have reviewed and discussed with patient certain preventive protocols, quality metrics, and best practice recommendations. A written personalized care plan for preventive services as well as general preventive health recommendations were provided to patient.     Sandrea Hammond, LPN   1/69/6789   Nurse Notes: 6CIT = 13 Made appt for her to get F2F for bedside commode as she has a hard time walking to the bathroom in the night due to severe pain and balance problems

## 2021-02-11 NOTE — Patient Instructions (Signed)
Tracy Neal , Thank you for taking time to come for your Medicare Wellness Visit. I appreciate your ongoing commitment to your health goals. Please review the following plan we discussed and let me know if I can assist you in the future.   Screening recommendations/referrals: Colonoscopy: Done 03/16/2015 - Repeat in 10 years if desired Mammogram: Done 03/30/2015 - declines repeat Bone Density: Done 10/06/2020 -Repeat every 2 years  Recommended yearly ophthalmology/optometry visit for glaucoma screening and checkup Recommended yearly dental visit for hygiene and checkup  Vaccinations: Influenza vaccine: Done 09/30/2020 - Repeat annually Pneumococcal vaccine: Due Tdap vaccine: Due Shingles vaccine: Due   Covid-19: One done - no boosters  Advanced directives: Advance directive discussed with you today. Even though you declined this today, please call our office should you change your mind, and we can give you the proper paperwork for you to fill out.   Conditions/risks identified: Aim for 30 minutes of exercise or brisk walking each day, drink 6-8 glasses of water and eat lots of fruits and vegetables.   Next appointment: Follow up in one year for your annual wellness visit    Preventive Care 65 Years and Older, Female Preventive care refers to lifestyle choices and visits with your health care provider that can promote health and wellness. What does preventive care include? A yearly physical exam. This is also called an annual well check. Dental exams once or twice a year. Routine eye exams. Ask your health care provider how often you should have your eyes checked. Personal lifestyle choices, including: Daily care of your teeth and gums. Regular physical activity. Eating a healthy diet. Avoiding tobacco and drug use. Limiting alcohol use. Practicing safe sex. Taking low-dose aspirin every day. Taking vitamin and mineral supplements as recommended by your health care provider. What  happens during an annual well check? The services and screenings done by your health care provider during your annual well check will depend on your age, overall health, lifestyle risk factors, and family history of disease. Counseling  Your health care provider may ask you questions about your: Alcohol use. Tobacco use. Drug use. Emotional well-being. Home and relationship well-being. Sexual activity. Eating habits. History of falls. Memory and ability to understand (cognition). Work and work Statistician. Reproductive health. Screening  You may have the following tests or measurements: Height, weight, and BMI. Blood pressure. Lipid and cholesterol levels. These may be checked every 5 years, or more frequently if you are over 74 years old. Skin check. Lung cancer screening. You may have this screening every year starting at age 13 if you have a 30-pack-year history of smoking and currently smoke or have quit within the past 15 years. Fecal occult blood test (FOBT) of the stool. You may have this test every year starting at age 33. Flexible sigmoidoscopy or colonoscopy. You may have a sigmoidoscopy every 5 years or a colonoscopy every 10 years starting at age 30. Hepatitis C blood test. Hepatitis B blood test. Sexually transmitted disease (STD) testing. Diabetes screening. This is done by checking your blood sugar (glucose) after you have not eaten for a while (fasting). You may have this done every 1-3 years. Bone density scan. This is done to screen for osteoporosis. You may have this done starting at age 65. Mammogram. This may be done every 1-2 years. Talk to your health care provider about how often you should have regular mammograms. Talk with your health care provider about your test results, treatment options, and if necessary, the  need for more tests. Vaccines  Your health care provider may recommend certain vaccines, such as: Influenza vaccine. This is recommended every  year. Tetanus, diphtheria, and acellular pertussis (Tdap, Td) vaccine. You may need a Td booster every 10 years. Zoster vaccine. You may need this after age 52. Pneumococcal 13-valent conjugate (PCV13) vaccine. One dose is recommended after age 39. Pneumococcal polysaccharide (PPSV23) vaccine. One dose is recommended after age 31. Talk to your health care provider about which screenings and vaccines you need and how often you need them. This information is not intended to replace advice given to you by your health care provider. Make sure you discuss any questions you have with your health care provider. Document Released: 01/29/2015 Document Revised: 09/22/2015 Document Reviewed: 11/03/2014 Elsevier Interactive Patient Education  2017 [place] Prevention in the Home Falls can cause injuries. They can happen to people of all ages. There are many things you can do to make your home safe and to help prevent falls. What can I do on the outside of my home? Regularly fix the edges of walkways and driveways and fix any cracks. Remove anything that might make you trip as you walk through a door, such as a raised step or threshold. Trim any bushes or trees on the path to your home. Use bright outdoor lighting. Clear any walking paths of anything that might make someone trip, such as rocks or tools. Regularly check to see if handrails are loose or broken. Make sure that both sides of any steps have handrails. Any raised decks and porches should have guardrails on the edges. Have any leaves, snow, or ice cleared regularly. Use sand or salt on walking paths during winter. Clean up any spills in your garage right away. This includes oil or grease spills. What can I do in the bathroom? Use night lights. Install grab bars by the toilet and in the tub and shower. Do not use towel bars as grab bars. Use non-skid mats or decals in the tub or shower. If you need to sit down in the shower, use a  plastic, non-slip stool. Keep the floor dry. Clean up any water that spills on the floor as soon as it happens. Remove soap buildup in the tub or shower regularly. Attach bath mats securely with double-sided non-slip rug tape. Do not have throw rugs and other things on the floor that can make you trip. What can I do in the bedroom? Use night lights. Make sure that you have a light by your bed that is easy to reach. Do not use any sheets or blankets that are too big for your bed. They should not hang down onto the floor. Have a firm chair that has side arms. You can use this for support while you get dressed. Do not have throw rugs and other things on the floor that can make you trip. What can I do in the kitchen? Clean up any spills right away. Avoid walking on wet floors. Keep items that you use a lot in easy-to-reach places. If you need to reach something above you, use a strong step stool that has a grab bar. Keep electrical cords out of the way. Do not use floor polish or wax that makes floors slippery. If you must use wax, use non-skid floor wax. Do not have throw rugs and other things on the floor that can make you trip. What can I do with my stairs? Do not leave any items on the  stairs. Make sure that there are handrails on both sides of the stairs and use them. Fix handrails that are broken or loose. Make sure that handrails are as long as the stairways. Check any carpeting to make sure that it is firmly attached to the stairs. Fix any carpet that is loose or worn. Avoid having throw rugs at the top or bottom of the stairs. If you do have throw rugs, attach them to the floor with carpet tape. Make sure that you have a light switch at the top of the stairs and the bottom of the stairs. If you do not have them, ask someone to add them for you. What else can I do to help prevent falls? Wear shoes that: Do not have high heels. Have rubber bottoms. Are comfortable and fit you  well. Are closed at the toe. Do not wear sandals. If you use a stepladder: Make sure that it is fully opened. Do not climb a closed stepladder. Make sure that both sides of the stepladder are locked into place. Ask someone to hold it for you, if possible. Clearly mark and make sure that you can see: Any grab bars or handrails. First and last steps. Where the edge of each step is. Use tools that help you move around (mobility aids) if they are needed. These include: Canes. Walkers. Scooters. Crutches. Turn on the lights when you go into a dark area. Replace any light bulbs as soon as they burn out. Set up your furniture so you have a clear path. Avoid moving your furniture around. If any of your floors are uneven, fix them. If there are any pets around you, be aware of where they are. Review your medicines with your doctor. Some medicines can make you feel dizzy. This can increase your chance of falling. Ask your doctor what other things that you can do to help prevent falls. This information is not intended to replace advice given to you by your health care provider. Make sure you discuss any questions you have with your health care provider. Document Released: 10/29/2008 Document Revised: 06/10/2015 Document Reviewed: 02/06/2014 Elsevier Interactive Patient Education  2017 [employer].

## 2021-02-16 ENCOUNTER — Encounter (HOSPITAL_COMMUNITY): Payer: Self-pay | Admitting: Hematology

## 2021-02-22 DIAGNOSIS — C7800 Secondary malignant neoplasm of unspecified lung: Secondary | ICD-10-CM | POA: Diagnosis not present

## 2021-02-22 DIAGNOSIS — D72829 Elevated white blood cell count, unspecified: Secondary | ICD-10-CM | POA: Diagnosis not present

## 2021-02-22 DIAGNOSIS — C50919 Malignant neoplasm of unspecified site of unspecified female breast: Secondary | ICD-10-CM | POA: Diagnosis not present

## 2021-02-23 ENCOUNTER — Ambulatory Visit: Payer: Commercial Managed Care - HMO | Admitting: Family Medicine

## 2021-02-24 DIAGNOSIS — J432 Centrilobular emphysema: Secondary | ICD-10-CM | POA: Diagnosis not present

## 2021-02-24 DIAGNOSIS — I700 Atherosclerosis of aorta: Secondary | ICD-10-CM | POA: Diagnosis not present

## 2021-02-24 DIAGNOSIS — C50919 Malignant neoplasm of unspecified site of unspecified female breast: Secondary | ICD-10-CM | POA: Diagnosis not present

## 2021-02-24 DIAGNOSIS — Z170 Estrogen receptor positive status [ER+]: Secondary | ICD-10-CM | POA: Diagnosis not present

## 2021-02-24 DIAGNOSIS — J438 Other emphysema: Secondary | ICD-10-CM | POA: Diagnosis not present

## 2021-02-24 DIAGNOSIS — R918 Other nonspecific abnormal finding of lung field: Secondary | ICD-10-CM | POA: Diagnosis not present

## 2021-02-24 DIAGNOSIS — Z853 Personal history of malignant neoplasm of breast: Secondary | ICD-10-CM | POA: Diagnosis not present

## 2021-02-24 DIAGNOSIS — C50111 Malignant neoplasm of central portion of right female breast: Secondary | ICD-10-CM | POA: Diagnosis not present

## 2021-02-24 DIAGNOSIS — C7951 Secondary malignant neoplasm of bone: Secondary | ICD-10-CM | POA: Diagnosis not present

## 2021-02-24 DIAGNOSIS — K439 Ventral hernia without obstruction or gangrene: Secondary | ICD-10-CM | POA: Diagnosis not present

## 2021-02-24 DIAGNOSIS — K449 Diaphragmatic hernia without obstruction or gangrene: Secondary | ICD-10-CM | POA: Diagnosis not present

## 2021-03-01 DIAGNOSIS — I700 Atherosclerosis of aorta: Secondary | ICD-10-CM | POA: Diagnosis not present

## 2021-03-01 DIAGNOSIS — C3492 Malignant neoplasm of unspecified part of left bronchus or lung: Secondary | ICD-10-CM | POA: Diagnosis not present

## 2021-03-01 DIAGNOSIS — R12 Heartburn: Secondary | ICD-10-CM | POA: Diagnosis not present

## 2021-03-01 DIAGNOSIS — Z7951 Long term (current) use of inhaled steroids: Secondary | ICD-10-CM | POA: Diagnosis not present

## 2021-03-01 DIAGNOSIS — Z885 Allergy status to narcotic agent status: Secondary | ICD-10-CM | POA: Diagnosis not present

## 2021-03-01 DIAGNOSIS — G893 Neoplasm related pain (acute) (chronic): Secondary | ICD-10-CM | POA: Diagnosis not present

## 2021-03-01 DIAGNOSIS — C773 Secondary and unspecified malignant neoplasm of axilla and upper limb lymph nodes: Secondary | ICD-10-CM | POA: Diagnosis not present

## 2021-03-01 DIAGNOSIS — K2970 Gastritis, unspecified, without bleeding: Secondary | ICD-10-CM | POA: Diagnosis not present

## 2021-03-01 DIAGNOSIS — C7951 Secondary malignant neoplasm of bone: Secondary | ICD-10-CM | POA: Diagnosis not present

## 2021-03-01 DIAGNOSIS — Z79899 Other long term (current) drug therapy: Secondary | ICD-10-CM | POA: Diagnosis not present

## 2021-03-01 DIAGNOSIS — J439 Emphysema, unspecified: Secondary | ICD-10-CM | POA: Diagnosis not present

## 2021-03-01 DIAGNOSIS — Z5112 Encounter for antineoplastic immunotherapy: Secondary | ICD-10-CM | POA: Diagnosis not present

## 2021-03-01 DIAGNOSIS — C50111 Malignant neoplasm of central portion of right female breast: Secondary | ICD-10-CM | POA: Diagnosis not present

## 2021-03-01 DIAGNOSIS — F17210 Nicotine dependence, cigarettes, uncomplicated: Secondary | ICD-10-CM | POA: Diagnosis not present

## 2021-03-01 DIAGNOSIS — Z780 Asymptomatic menopausal state: Secondary | ICD-10-CM | POA: Diagnosis not present

## 2021-03-01 DIAGNOSIS — C50919 Malignant neoplasm of unspecified site of unspecified female breast: Secondary | ICD-10-CM | POA: Diagnosis not present

## 2021-03-01 DIAGNOSIS — Z170 Estrogen receptor positive status [ER+]: Secondary | ICD-10-CM | POA: Diagnosis not present

## 2021-03-01 DIAGNOSIS — E278 Other specified disorders of adrenal gland: Secondary | ICD-10-CM | POA: Diagnosis not present

## 2021-03-01 DIAGNOSIS — C3491 Malignant neoplasm of unspecified part of right bronchus or lung: Secondary | ICD-10-CM | POA: Diagnosis not present

## 2021-03-23 ENCOUNTER — Encounter (INDEPENDENT_AMBULATORY_CARE_PROVIDER_SITE_OTHER): Payer: Self-pay | Admitting: *Deleted

## 2021-03-25 ENCOUNTER — Encounter (HOSPITAL_COMMUNITY): Payer: Self-pay | Admitting: Hematology

## 2021-03-25 ENCOUNTER — Ambulatory Visit
Admission: EM | Admit: 2021-03-25 | Discharge: 2021-03-25 | Disposition: A | Discharge status: Home / Self Care | Payer: Medicare Other

## 2021-03-25 ENCOUNTER — Other Ambulatory Visit: Payer: Self-pay

## 2021-03-26 ENCOUNTER — Ambulatory Visit
Admission: EM | Admit: 2021-03-26 | Discharge: 2021-03-26 | Disposition: A | Discharge status: Home / Self Care | Payer: Medicare Other | Attending: Family Medicine | Admitting: Family Medicine

## 2021-03-26 VITALS — BP 115/72 | HR 86 | Temp 97.9°F | Resp 18 | Ht 67.0 in | Wt 240.0 lb

## 2021-03-26 DIAGNOSIS — J441 Chronic obstructive pulmonary disease with (acute) exacerbation: Secondary | ICD-10-CM | POA: Diagnosis not present

## 2021-03-26 DIAGNOSIS — J069 Acute upper respiratory infection, unspecified: Secondary | ICD-10-CM

## 2021-03-26 MED ORDER — PREDNISONE 20 MG PO TABS: 40.0000 mg | ORAL_TABLET | Freq: Every day | ORAL | 0 refills | Status: DC

## 2021-03-26 MED ORDER — DOXYCYCLINE HYCLATE 100 MG PO CAPS: 100.0000 mg | ORAL_CAPSULE | Freq: Two times a day (BID) | ORAL | 0 refills | Status: DC

## 2021-03-26 NOTE — ED Provider Notes (Signed)
[facility]    CSN: [id number] Arrival date & time: 03/26/21  1251      History   Chief Complaint Chief Complaint  Patient presents with   Appointment    1    HPI Tracy Neal is a 66 y.o. female.   Presenting today with 1 week history of progressively worsening hacking productive cough, chest tightness, shortness of breath, nasal congestion.  States her posterior ribs hurt bilaterally from coughing at this time.  Denies fever, chills, body aches, abdominal pain, nausea vomiting or diarrhea.  Has been trying Mucinex with no relief.  History of COPD compliant with inhaler regimen.  Has also been trying nebulizer treatments with mild temporary relief of symptoms.   Past Medical History:  Diagnosis Date   Anxiety    Arthritis    Basal cell carcinoma    not biopsy confirmed; Right bridge of nose   Breast cancer (HCC)    COPD (chronic obstructive pulmonary disease) (HCC)    COPD, moderate (HCC)    Depression    GERD (gastroesophageal reflux disease)    Shortness of breath dyspnea    Umbilical hernia     Patient Active Problem List   Diagnosis Date Noted   Age-related incipient cataract of both eyes 05/01/2020   Herpes zoster without complication 58/85/0277   Bone metastasis ([place]) 03/30/2020   Cancer related pain 03/30/2020   Metastatic breast cancer ([place]) 09/24/2019   Goals of care, counseling/discussion 09/24/2019   Lung metastases ([place]) 08/18/2019   S/P right knee arthroscopy with chondroplasty medial condyle / 08/07/19 08/11/2019   Derangement of posterior horn of medial meniscus of right knee    Morbid obesity ([place]) 06/09/2016   GERD (gastroesophageal reflux disease) 06/10/2015   Anxiety and depression 06/10/2015   Osteoarthritis of right knee 05/13/2015   Osteoarthritis of left knee 05/13/2015   Degenerative disc disease, lumbar 05/13/2015   Special screening for malignant neoplasms, colon    Cocaine abuse ([place]) 12/22/2014   Chronic pain  syndrome 10/22/2014   Lumbar facet arthropathy 10/22/2014   Osteopenia 10/22/2014   Tobacco abuse 10/22/2014   Leucocytosis 12/31/2012   Breast cancer ([place]) 12/20/2012   COPD (chronic obstructive pulmonary disease) ([place]) 08/14/2012   Cancer of central portion of female breast ([place]) 06/19/2012   Breast lump on right side at 12 o'clock position 06/11/2012    Past Surgical History:  Procedure Laterality Date   BREAST LUMPECTOMY Right 07/15/2012   Procedure: BREAST LUMPECTOMY WITH AXILLARY LYMPH NODE BIOPSY;  Surgeon: Harl Bowie, MD;  Location: [place] [facility];  Service: General;  Laterality: Right;  Nuclear medicine injection 9:30    CATARACT EXTRACTION W/PHACO Right 10/15/2020   Procedure: CATARACT EXTRACTION PHACO AND INTRAOCULAR LENS PLACEMENT RIGHT EYE;  Surgeon: Baruch Goldmann, MD;  Location: [facility];  Service: Ophthalmology;  Laterality: Right;  CDE=32.88   CHOLECYSTECTOMY     CHONDROPLASTY  08/07/2019   Procedure: CHONDROPLASTY MEDIAL CONDYAL;  Surgeon: Carole Civil, MD;  Location: [facility];  Service: Orthopedics;;   COLONOSCOPY N/A 03/16/2015   Procedure: COLONOSCOPY;  Surgeon: Danie Binder, MD;  [facility];  Service: Endoscopy;  Laterality: N/A;  2:30 PM - moved to 12:30 - office to notify pt   KNEE ARTHROSCOPY WITH MEDIAL MENISECTOMY Right 08/07/2019   Procedure: KNEE ARTHROSCOPY WITH MEDIAL MENISECTOMY;  Surgeon: Carole Civil, MD;  Location: [facility];  Service: Orthopedics;  Laterality: Right;   TUBAL LIGATION      OB History  Gravida  2   Para  2   Term  2   Preterm  0   AB  0   Living         SAB  0   IAB  0   Ectopic  0   Multiple      Live Births               Home Medications    Prior to Admission medications   Medication Sig Start Date End Date Taking? Authorizing Provider  doxycycline (VIBRAMYCIN) 100 MG capsule Take 1 capsule (100 mg total) by mouth 2 (two) times daily. 03/26/21  Yes Volney American, PA-C   predniSONE (DELTASONE) 20 MG tablet Take 2 tablets (40 mg total) by mouth daily with breakfast. 03/26/21  Yes Volney American, PA-C  albuterol (PROVENTIL) (2.5 MG/3ML) 0.083% nebulizer solution Take 3 mLs (2.5 mg total) by nebulization every 4 (four) hours as needed for wheezing or shortness of breath. 09/30/20   [name], Fransisca Kaufmann, MD  albuterol (VENTOLIN HFA) 108 (90 Base) MCG/ACT inhaler Inhale 1-2 puffs into the lungs every 4 (four) hours as needed for wheezing or shortness of breath. 09/30/20   [name], Fransisca Kaufmann, MD  amitriptyline (ELAVIL) 25 MG tablet TAKE 1 TO 2 TABLETS AT BEDTIME. Patient taking differently: Take 50 mg by mouth at bedtime. 09/30/20   [name], Fransisca Kaufmann, MD  budesonide-formoterol (SYMBICORT) 160-4.5 MCG/ACT inhaler INHALE 2 PUFFS INTO THE LUNGS 2 TIMES A DAY. Patient taking differently: Inhale 2 puffs into the lungs in the morning. 09/30/20   [name], Fransisca Kaufmann, MD  Calcium Carb-Cholecalciferol (CALCIUM+D3 PO) Take 1 tablet by mouth in the morning. Patient not taking: Reported on 02/11/2021    [provider]  Calcium-Phosphorus-Vitamin D (CALCIUM GUMMIES PO) Take 1 tablet by mouth in the morning and at bedtime.    [provider]  celecoxib (CELEBREX) 200 MG capsule Take 200 mg by mouth in the morning. Patient not taking: Reported on 02/11/2021 09/27/20   [provider]  diclofenac Sodium (VOLTAREN) 1 % GEL Apply 2 g topically 4 (four) times daily. Patient taking differently: Apply 2 g topically 4 (four) times daily as needed (pain.). 09/30/20   [name], Fransisca Kaufmann, MD  esomeprazole (NEXIUM) 40 MG capsule Take 1 capsule (40 mg total) by mouth daily. 09/30/20   [name], Fransisca Kaufmann, MD  HYDROcodone-acetaminophen (NORCO) 7.5-325 MG tablet Take 1 tablet by mouth daily as needed.    [provider]  IBRANCE 125 MG capsule Take 125 mg by mouth See admin instructions. TAKE 1 TABLET (125 MG TOTAL) BY MOUTH DAILY IN THE EVENING. TAKE FOR 21  DAYS ON, 7 DAYS OFF, REPEAT EVERY 28 DAYS 09/24/20   [provider]  ibuprofen (IBU) 800 MG tablet TAKE (1) TABLET EVERY EIGHT HOURS AS NEEDED. 09/30/20   [name], Fransisca Kaufmann, MD  ILEVRO 0.3 % ophthalmic suspension Place 1 drop into the right eye as directed. Patient not taking: Reported on 02/11/2021 09/09/20   [provider]  moxifloxacin (VIGAMOX) 0.5 % ophthalmic solution Apply 1 drop to eye as directed. Patient not taking: Reported on 02/11/2021 09/09/20   [provider]  ondansetron (ZOFRAN-ODT) 8 MG disintegrating tablet TAKE 1 TABLET BY MOUTH EVERY 8 HOURS AS NEEDED FOR NAUSEA AND VOMITING. 04/01/20   Derek Jack, MD  PARoxetine (PAXIL) 20 MG tablet Take 20 mg by mouth daily. Patient not taking: Reported on 02/11/2021 12/16/20   [provider]  prednisoLONE acetate (PRED  FORTE) 1 % ophthalmic suspension Place 1 drop into the right eye as directed. Patient not taking: Reported on 02/11/2021 09/09/20   [provider]  umeclidinium bromide (INCRUSE ELLIPTA) 62.5 MCG/INH AEPB Inhale 2 puffs into the lungs daily. 09/30/20   [name], Fransisca Kaufmann, MD  vitamin B-12 (CYANOCOBALAMIN) 1000 MCG tablet Take 1,000 mcg by mouth daily.    [provider]    Family History Family History  Problem Relation Age of Onset   Heart disease Mother    Cancer Paternal Aunt    Heart attack Sister     Social History Social History   Tobacco Use   Smoking status: Some Days    Packs/day: 0.25    Years: 40.00    Pack years: 10.00    Types: Cigarettes   Smokeless tobacco: Never  Vaping Use   Vaping Use: Never used  Substance Use Topics   Alcohol use: No   Drug use: No     Allergies   Percocet [oxycodone-acetaminophen]   Review of Systems Review of Systems Per HPI  Physical Exam Triage Vital Signs ED Triage Vitals [03/26/21 1326]  Enc Vitals Group     BP 115/72     Pulse Rate 86     Resp 18     Temp 97.9 F (36.6 C)     Temp  Source Oral     SpO2 96 %     Weight 240 lb (108.9 kg)     Height 5\' 7"  (1.702 m)     Head Circumference      Peak Flow      Pain Score 5     Pain Loc      Pain Edu?      Excl. in [place]?    No data found.  Updated Vital Signs BP 115/72 (BP Location: Right Arm)    Pulse 86    Temp 97.9 F (36.6 C) (Oral)    Resp 18    Ht 5\' 7"  (1.702 m)    Wt 240 lb (108.9 kg)    SpO2 96%    BMI 37.59 kg/m   Visual Acuity Right Eye Distance:   Left Eye Distance:   Bilateral Distance:    Right Eye Near:   Left Eye Near:    Bilateral Near:     Physical Exam Vitals and nursing note reviewed.  Constitutional:      Appearance: Normal appearance.  HENT:     Head: Atraumatic.     Right Ear: Tympanic membrane and external ear normal.     Left Ear: Tympanic membrane and external ear normal.     Nose: Congestion present.     Mouth/Throat:     Mouth: Mucous membranes are moist.     Pharynx: Posterior oropharyngeal erythema present.  Eyes:     Extraocular Movements: Extraocular movements intact.     Conjunctiva/sclera: Conjunctivae normal.  Cardiovascular:     Rate and Rhythm: Normal rate and regular rhythm.     Heart sounds: Normal heart sounds.  Pulmonary:     Effort: Pulmonary effort is normal.     Breath sounds: Wheezing present. No rales.     Comments: Decreased breath sounds bilaterally, wheezes scattered throughout Musculoskeletal:        General: Normal range of motion.     Cervical back: Normal range of motion and neck supple.  Skin:    General: Skin is warm and dry.  Neurological:     Mental Status: She is alert  and oriented to person, place, and time.  Psychiatric:        Mood and Affect: Mood normal.        Thought Content: Thought content normal.   UC Treatments / Results  Labs (all labs ordered are listed, but only abnormal results are displayed) Labs Reviewed - No data to display  EKG  Radiology No results found.  Procedures Procedures (including critical care  time)  Medications Ordered in UC Medications - No data to display  Initial Impression / Assessment and Plan / UC Course  I have reviewed the triage vital signs and the nursing notes.  Pertinent labs & imaging results that were available during my care of the patient were reviewed by me and considered in my medical decision making (see chart for details).     Suspect upper respiratory infection leading to COPD exacerbation.  We will treat with prednisone, doxycycline in addition to continued over-the-counter supportive medications and home care.  Continue COPD regimen with inhalers and neb treatments as needed.  Close PCP follow-up recommended.  Return for any worsening symptoms.  Final Clinical Impressions(s) / UC Diagnoses   Final diagnoses:  COPD exacerbation ([place])  Upper respiratory tract infection, unspecified type   Discharge Instructions   None    ED Prescriptions     Medication Sig Dispense Auth. Provider   doxycycline (VIBRAMYCIN) 100 MG capsule Take 1 capsule (100 mg total) by mouth 2 (two) times daily. 14 capsule Volney American, [place]   predniSONE (DELTASONE) 20 MG tablet Take 2 tablets (40 mg total) by mouth daily with breakfast. 10 tablet Volney American, [place]      PDMP not reviewed this encounter.   Volney American, [place] 03/26/21 1347

## 2021-03-26 NOTE — ED Triage Notes (Signed)
Pt reports cough, bilateral rib cage pain x1 week.  ? ?Pt reports also has stage 4 cancer and states ever since had hiatal hernia removed and reports generalized pain and abdominal swelling with eating since procedure. Pt reports that has been flaring up recently as well. ?

## 2021-03-28 ENCOUNTER — Ambulatory Visit: Payer: Medicare HMO | Admitting: Family Medicine

## 2021-03-28 DIAGNOSIS — C50919 Malignant neoplasm of unspecified site of unspecified female breast: Secondary | ICD-10-CM | POA: Diagnosis not present

## 2021-03-28 DIAGNOSIS — C7951 Secondary malignant neoplasm of bone: Secondary | ICD-10-CM | POA: Diagnosis not present

## 2021-03-29 DIAGNOSIS — C772 Secondary and unspecified malignant neoplasm of intra-abdominal lymph nodes: Secondary | ICD-10-CM | POA: Diagnosis not present

## 2021-03-29 DIAGNOSIS — C7802 Secondary malignant neoplasm of left lung: Secondary | ICD-10-CM | POA: Diagnosis not present

## 2021-03-29 DIAGNOSIS — E278 Other specified disorders of adrenal gland: Secondary | ICD-10-CM | POA: Diagnosis not present

## 2021-03-29 DIAGNOSIS — Z5111 Encounter for antineoplastic chemotherapy: Secondary | ICD-10-CM | POA: Diagnosis not present

## 2021-03-29 DIAGNOSIS — C50919 Malignant neoplasm of unspecified site of unspecified female breast: Secondary | ICD-10-CM | POA: Diagnosis not present

## 2021-03-29 DIAGNOSIS — C7951 Secondary malignant neoplasm of bone: Secondary | ICD-10-CM | POA: Diagnosis not present

## 2021-03-29 DIAGNOSIS — C50111 Malignant neoplasm of central portion of right female breast: Secondary | ICD-10-CM | POA: Diagnosis not present

## 2021-03-29 DIAGNOSIS — Z7952 Long term (current) use of systemic steroids: Secondary | ICD-10-CM | POA: Diagnosis not present

## 2021-03-29 DIAGNOSIS — I700 Atherosclerosis of aorta: Secondary | ICD-10-CM | POA: Diagnosis not present

## 2021-03-29 DIAGNOSIS — F17210 Nicotine dependence, cigarettes, uncomplicated: Secondary | ICD-10-CM | POA: Diagnosis not present

## 2021-03-29 DIAGNOSIS — Z170 Estrogen receptor positive status [ER+]: Secondary | ICD-10-CM | POA: Diagnosis not present

## 2021-03-29 DIAGNOSIS — Z79899 Other long term (current) drug therapy: Secondary | ICD-10-CM | POA: Diagnosis not present

## 2021-03-29 DIAGNOSIS — Z780 Asymptomatic menopausal state: Secondary | ICD-10-CM | POA: Diagnosis not present

## 2021-03-29 DIAGNOSIS — Z79891 Long term (current) use of opiate analgesic: Secondary | ICD-10-CM | POA: Diagnosis not present

## 2021-03-29 DIAGNOSIS — C7801 Secondary malignant neoplasm of right lung: Secondary | ICD-10-CM | POA: Diagnosis not present

## 2021-03-29 DIAGNOSIS — J439 Emphysema, unspecified: Secondary | ICD-10-CM | POA: Diagnosis not present

## 2021-03-29 DIAGNOSIS — Z5112 Encounter for antineoplastic immunotherapy: Secondary | ICD-10-CM | POA: Diagnosis not present

## 2021-03-29 DIAGNOSIS — M545 Low back pain, unspecified: Secondary | ICD-10-CM | POA: Diagnosis not present

## 2021-04-17 ENCOUNTER — Encounter (HOSPITAL_COMMUNITY): Payer: Self-pay | Admitting: Hematology

## 2021-04-25 DIAGNOSIS — C7951 Secondary malignant neoplasm of bone: Secondary | ICD-10-CM | POA: Diagnosis not present

## 2021-04-25 DIAGNOSIS — C50919 Malignant neoplasm of unspecified site of unspecified female breast: Secondary | ICD-10-CM | POA: Diagnosis not present

## 2021-04-25 DIAGNOSIS — D72829 Elevated white blood cell count, unspecified: Secondary | ICD-10-CM | POA: Diagnosis not present

## 2021-04-25 DIAGNOSIS — F32A Depression, unspecified: Secondary | ICD-10-CM | POA: Diagnosis not present

## 2021-04-25 DIAGNOSIS — Z09 Encounter for follow-up examination after completed treatment for conditions other than malignant neoplasm: Secondary | ICD-10-CM | POA: Diagnosis not present

## 2021-04-25 DIAGNOSIS — M8580 Other specified disorders of bone density and structure, unspecified site: Secondary | ICD-10-CM | POA: Diagnosis not present

## 2021-04-25 DIAGNOSIS — M47816 Spondylosis without myelopathy or radiculopathy, lumbar region: Secondary | ICD-10-CM | POA: Diagnosis not present

## 2021-04-25 DIAGNOSIS — R799 Abnormal finding of blood chemistry, unspecified: Secondary | ICD-10-CM | POA: Diagnosis not present

## 2021-04-25 DIAGNOSIS — C7800 Secondary malignant neoplasm of unspecified lung: Secondary | ICD-10-CM | POA: Diagnosis not present

## 2021-04-26 DIAGNOSIS — C7951 Secondary malignant neoplasm of bone: Secondary | ICD-10-CM | POA: Diagnosis not present

## 2021-04-26 DIAGNOSIS — J449 Chronic obstructive pulmonary disease, unspecified: Secondary | ICD-10-CM | POA: Diagnosis not present

## 2021-04-26 DIAGNOSIS — E278 Other specified disorders of adrenal gland: Secondary | ICD-10-CM | POA: Diagnosis not present

## 2021-04-26 DIAGNOSIS — G893 Neoplasm related pain (acute) (chronic): Secondary | ICD-10-CM | POA: Diagnosis not present

## 2021-04-26 DIAGNOSIS — K219 Gastro-esophageal reflux disease without esophagitis: Secondary | ICD-10-CM | POA: Diagnosis not present

## 2021-04-26 DIAGNOSIS — Z79899 Other long term (current) drug therapy: Secondary | ICD-10-CM | POA: Diagnosis not present

## 2021-04-26 DIAGNOSIS — C7802 Secondary malignant neoplasm of left lung: Secondary | ICD-10-CM | POA: Diagnosis not present

## 2021-04-26 DIAGNOSIS — F17210 Nicotine dependence, cigarettes, uncomplicated: Secondary | ICD-10-CM | POA: Diagnosis not present

## 2021-04-26 DIAGNOSIS — C772 Secondary and unspecified malignant neoplasm of intra-abdominal lymph nodes: Secondary | ICD-10-CM | POA: Diagnosis not present

## 2021-04-26 DIAGNOSIS — C50919 Malignant neoplasm of unspecified site of unspecified female breast: Secondary | ICD-10-CM | POA: Diagnosis not present

## 2021-04-26 DIAGNOSIS — Z885 Allergy status to narcotic agent status: Secondary | ICD-10-CM | POA: Diagnosis not present

## 2021-04-26 DIAGNOSIS — R935 Abnormal findings on diagnostic imaging of other abdominal regions, including retroperitoneum: Secondary | ICD-10-CM | POA: Diagnosis not present

## 2021-04-26 DIAGNOSIS — R918 Other nonspecific abnormal finding of lung field: Secondary | ICD-10-CM | POA: Diagnosis not present

## 2021-04-26 DIAGNOSIS — K439 Ventral hernia without obstruction or gangrene: Secondary | ICD-10-CM | POA: Diagnosis not present

## 2021-04-26 DIAGNOSIS — Z170 Estrogen receptor positive status [ER+]: Secondary | ICD-10-CM | POA: Diagnosis not present

## 2021-04-26 DIAGNOSIS — Z5111 Encounter for antineoplastic chemotherapy: Secondary | ICD-10-CM | POA: Diagnosis not present

## 2021-04-26 DIAGNOSIS — C7801 Secondary malignant neoplasm of right lung: Secondary | ICD-10-CM | POA: Diagnosis not present

## 2021-04-26 DIAGNOSIS — K458 Other specified abdominal hernia without obstruction or gangrene: Secondary | ICD-10-CM | POA: Diagnosis not present

## 2021-04-26 DIAGNOSIS — Z7951 Long term (current) use of inhaled steroids: Secondary | ICD-10-CM | POA: Diagnosis not present

## 2021-04-26 DIAGNOSIS — C50111 Malignant neoplasm of central portion of right female breast: Secondary | ICD-10-CM | POA: Diagnosis not present

## 2021-05-02 ENCOUNTER — Encounter: Payer: Self-pay | Admitting: Family Medicine

## 2021-05-02 ENCOUNTER — Ambulatory Visit: Payer: Medicaid Other | Admitting: Family Medicine

## 2021-05-24 DIAGNOSIS — Z09 Encounter for follow-up examination after completed treatment for conditions other than malignant neoplasm: Secondary | ICD-10-CM | POA: Diagnosis not present

## 2021-05-24 DIAGNOSIS — C7951 Secondary malignant neoplasm of bone: Secondary | ICD-10-CM | POA: Diagnosis not present

## 2021-05-24 DIAGNOSIS — C7800 Secondary malignant neoplasm of unspecified lung: Secondary | ICD-10-CM | POA: Diagnosis not present

## 2021-05-24 DIAGNOSIS — C50919 Malignant neoplasm of unspecified site of unspecified female breast: Secondary | ICD-10-CM | POA: Diagnosis not present

## 2021-05-24 DIAGNOSIS — Z5111 Encounter for antineoplastic chemotherapy: Secondary | ICD-10-CM | POA: Diagnosis not present

## 2021-05-24 DIAGNOSIS — G893 Neoplasm related pain (acute) (chronic): Secondary | ICD-10-CM | POA: Diagnosis not present

## 2021-05-24 DIAGNOSIS — D72829 Elevated white blood cell count, unspecified: Secondary | ICD-10-CM | POA: Diagnosis not present

## 2021-05-24 DIAGNOSIS — R110 Nausea: Secondary | ICD-10-CM | POA: Diagnosis not present

## 2021-05-25 DIAGNOSIS — C50919 Malignant neoplasm of unspecified site of unspecified female breast: Secondary | ICD-10-CM | POA: Diagnosis not present

## 2021-05-25 DIAGNOSIS — Z5111 Encounter for antineoplastic chemotherapy: Secondary | ICD-10-CM | POA: Diagnosis not present

## 2021-05-25 DIAGNOSIS — Z5112 Encounter for antineoplastic immunotherapy: Secondary | ICD-10-CM | POA: Diagnosis not present

## 2021-05-25 DIAGNOSIS — C7951 Secondary malignant neoplasm of bone: Secondary | ICD-10-CM | POA: Diagnosis not present

## 2021-05-31 ENCOUNTER — Ambulatory Visit (INDEPENDENT_AMBULATORY_CARE_PROVIDER_SITE_OTHER): Payer: Medicare HMO | Admitting: Gastroenterology

## 2021-05-31 ENCOUNTER — Encounter (INDEPENDENT_AMBULATORY_CARE_PROVIDER_SITE_OTHER): Payer: Self-pay | Admitting: Gastroenterology

## 2021-06-01 ENCOUNTER — Encounter (INDEPENDENT_AMBULATORY_CARE_PROVIDER_SITE_OTHER): Payer: Self-pay | Admitting: *Deleted

## 2021-06-23 DIAGNOSIS — C7800 Secondary malignant neoplasm of unspecified lung: Secondary | ICD-10-CM | POA: Diagnosis not present

## 2021-06-23 DIAGNOSIS — C7951 Secondary malignant neoplasm of bone: Secondary | ICD-10-CM | POA: Diagnosis not present

## 2021-06-23 DIAGNOSIS — F32A Depression, unspecified: Secondary | ICD-10-CM | POA: Diagnosis not present

## 2021-06-23 DIAGNOSIS — Z5111 Encounter for antineoplastic chemotherapy: Secondary | ICD-10-CM | POA: Diagnosis not present

## 2021-06-23 DIAGNOSIS — C50919 Malignant neoplasm of unspecified site of unspecified female breast: Secondary | ICD-10-CM | POA: Diagnosis not present

## 2021-06-23 DIAGNOSIS — J440 Chronic obstructive pulmonary disease with acute lower respiratory infection: Secondary | ICD-10-CM | POA: Diagnosis not present

## 2021-06-23 DIAGNOSIS — D72829 Elevated white blood cell count, unspecified: Secondary | ICD-10-CM | POA: Diagnosis not present

## 2021-06-23 DIAGNOSIS — Z09 Encounter for follow-up examination after completed treatment for conditions other than malignant neoplasm: Secondary | ICD-10-CM | POA: Diagnosis not present

## 2021-06-24 DIAGNOSIS — Z5112 Encounter for antineoplastic immunotherapy: Secondary | ICD-10-CM | POA: Diagnosis not present

## 2021-06-24 DIAGNOSIS — C7951 Secondary malignant neoplasm of bone: Secondary | ICD-10-CM | POA: Diagnosis not present

## 2021-06-24 DIAGNOSIS — C50919 Malignant neoplasm of unspecified site of unspecified female breast: Secondary | ICD-10-CM | POA: Diagnosis not present

## 2021-07-14 DIAGNOSIS — C7800 Secondary malignant neoplasm of unspecified lung: Secondary | ICD-10-CM | POA: Diagnosis not present

## 2021-07-14 DIAGNOSIS — C50919 Malignant neoplasm of unspecified site of unspecified female breast: Secondary | ICD-10-CM | POA: Diagnosis not present

## 2021-07-14 DIAGNOSIS — C7951 Secondary malignant neoplasm of bone: Secondary | ICD-10-CM | POA: Diagnosis not present

## 2021-07-21 ENCOUNTER — Other Ambulatory Visit: Payer: Self-pay | Admitting: Family Medicine

## 2021-07-21 DIAGNOSIS — M1711 Unilateral primary osteoarthritis, right knee: Secondary | ICD-10-CM

## 2021-07-22 ENCOUNTER — Other Ambulatory Visit: Payer: Self-pay | Admitting: Nurse Practitioner

## 2021-07-29 ENCOUNTER — Ambulatory Visit: Payer: Self-pay | Admitting: *Deleted

## 2021-07-29 NOTE — Patient Instructions (Signed)
Tracy Neal  I have previously worked with you through the Chronic Care Management Program at [facility]. Due to program changes I am removing myself from your care team because you've either met our goals, your conditions are stable and no longer require care management, or we haven't engaged within the past 6 months. If you are currently active with another CCM Team Member, you will remain active with them unless they reach out to you with additional information. If you feel that you need RN Care Management services in the future, please talk with your primary care provider to discuss re-engagement with the RN Care Manager that will be assigned to [facility]. This does not affect your status as a patient at [facility].   Thank you for allowing me to participate in your your healthcare journey.  Kristen Hudy, BSN, RN-BC Embedded Chronic Care Manager [facility] / [facility] Care Management Direct Dial: [phone]  

## 2021-07-29 NOTE — Chronic Care Management (AMB) (Signed)
  Chronic Care Management   Note  07/29/2021 Name: Tracy Neal MRN: [id number] DOB: 1956/01/15   Patient has either met RN Care Management goals, is stable from [place] Management perspective, or has not recently engaged with the RN Care Manager. I am removing RN Care Manager from Care Team and closing [place]. If patient is currently engaged with another CCM team member I will forward this encounter to inform them of my case closure. Patient may be eligible for re-engagement with RN Care Manager in the future if necessary and can discuss this with their PCP.  Chong Sicilian, BSN, RN-BC Embedded Chronic Care Manager [facility] / [place] Management Direct Dial: [phone]

## 2021-08-02 DIAGNOSIS — R918 Other nonspecific abnormal finding of lung field: Secondary | ICD-10-CM | POA: Diagnosis not present

## 2021-08-02 DIAGNOSIS — K5730 Diverticulosis of large intestine without perforation or abscess without bleeding: Secondary | ICD-10-CM | POA: Diagnosis not present

## 2021-08-02 DIAGNOSIS — J439 Emphysema, unspecified: Secondary | ICD-10-CM | POA: Diagnosis not present

## 2021-08-02 DIAGNOSIS — C7800 Secondary malignant neoplasm of unspecified lung: Secondary | ICD-10-CM | POA: Diagnosis not present

## 2021-08-02 DIAGNOSIS — C50919 Malignant neoplasm of unspecified site of unspecified female breast: Secondary | ICD-10-CM | POA: Diagnosis not present

## 2021-08-02 DIAGNOSIS — C7981 Secondary malignant neoplasm of breast: Secondary | ICD-10-CM | POA: Diagnosis not present

## 2021-08-02 DIAGNOSIS — C7951 Secondary malignant neoplasm of bone: Secondary | ICD-10-CM | POA: Diagnosis not present

## 2021-08-02 DIAGNOSIS — G893 Neoplasm related pain (acute) (chronic): Secondary | ICD-10-CM | POA: Diagnosis not present

## 2021-08-02 DIAGNOSIS — C801 Malignant (primary) neoplasm, unspecified: Secondary | ICD-10-CM | POA: Diagnosis not present

## 2021-08-02 DIAGNOSIS — G894 Chronic pain syndrome: Secondary | ICD-10-CM | POA: Diagnosis not present

## 2021-08-02 DIAGNOSIS — Z09 Encounter for follow-up examination after completed treatment for conditions other than malignant neoplasm: Secondary | ICD-10-CM | POA: Diagnosis not present

## 2021-08-03 ENCOUNTER — Other Ambulatory Visit: Payer: Self-pay | Admitting: Family Medicine

## 2021-08-03 DIAGNOSIS — M1711 Unilateral primary osteoarthritis, right knee: Secondary | ICD-10-CM

## 2021-08-04 DIAGNOSIS — Z720 Tobacco use: Secondary | ICD-10-CM | POA: Diagnosis not present

## 2021-08-04 DIAGNOSIS — G893 Neoplasm related pain (acute) (chronic): Secondary | ICD-10-CM | POA: Diagnosis not present

## 2021-08-04 DIAGNOSIS — J449 Chronic obstructive pulmonary disease, unspecified: Secondary | ICD-10-CM | POA: Diagnosis not present

## 2021-08-04 DIAGNOSIS — C7951 Secondary malignant neoplasm of bone: Secondary | ICD-10-CM | POA: Diagnosis not present

## 2021-08-04 DIAGNOSIS — C50919 Malignant neoplasm of unspecified site of unspecified female breast: Secondary | ICD-10-CM | POA: Diagnosis not present

## 2021-08-04 DIAGNOSIS — C7800 Secondary malignant neoplasm of unspecified lung: Secondary | ICD-10-CM | POA: Diagnosis not present

## 2021-08-05 DIAGNOSIS — Z5111 Encounter for antineoplastic chemotherapy: Secondary | ICD-10-CM | POA: Diagnosis not present

## 2021-08-05 DIAGNOSIS — C50919 Malignant neoplasm of unspecified site of unspecified female breast: Secondary | ICD-10-CM | POA: Diagnosis not present

## 2021-08-05 DIAGNOSIS — C7951 Secondary malignant neoplasm of bone: Secondary | ICD-10-CM | POA: Diagnosis not present

## 2021-08-13 ENCOUNTER — Encounter (HOSPITAL_COMMUNITY): Payer: Self-pay | Admitting: Hematology

## 2021-08-27 DIAGNOSIS — K047 Periapical abscess without sinus: Secondary | ICD-10-CM | POA: Diagnosis not present

## 2021-09-01 DIAGNOSIS — K0889 Other specified disorders of teeth and supporting structures: Secondary | ICD-10-CM | POA: Diagnosis not present

## 2021-09-01 DIAGNOSIS — Z720 Tobacco use: Secondary | ICD-10-CM | POA: Diagnosis not present

## 2021-09-01 DIAGNOSIS — C7800 Secondary malignant neoplasm of unspecified lung: Secondary | ICD-10-CM | POA: Diagnosis not present

## 2021-09-01 DIAGNOSIS — C50919 Malignant neoplasm of unspecified site of unspecified female breast: Secondary | ICD-10-CM | POA: Diagnosis not present

## 2021-09-01 DIAGNOSIS — C7951 Secondary malignant neoplasm of bone: Secondary | ICD-10-CM | POA: Diagnosis not present

## 2021-09-06 DIAGNOSIS — C50919 Malignant neoplasm of unspecified site of unspecified female breast: Secondary | ICD-10-CM | POA: Diagnosis not present

## 2021-09-07 ENCOUNTER — Other Ambulatory Visit: Payer: Self-pay | Admitting: Family Medicine

## 2021-09-07 DIAGNOSIS — M1711 Unilateral primary osteoarthritis, right knee: Secondary | ICD-10-CM

## 2021-09-12 ENCOUNTER — Ambulatory Visit: Payer: Medicare Other | Admitting: Family Medicine

## 2021-09-13 ENCOUNTER — Encounter: Payer: Self-pay | Admitting: Family Medicine

## 2021-10-03 DIAGNOSIS — C7951 Secondary malignant neoplasm of bone: Secondary | ICD-10-CM | POA: Diagnosis not present

## 2021-10-03 DIAGNOSIS — C7800 Secondary malignant neoplasm of unspecified lung: Secondary | ICD-10-CM | POA: Diagnosis not present

## 2021-10-03 DIAGNOSIS — C50919 Malignant neoplasm of unspecified site of unspecified female breast: Secondary | ICD-10-CM | POA: Diagnosis not present

## 2021-10-03 DIAGNOSIS — D72829 Elevated white blood cell count, unspecified: Secondary | ICD-10-CM | POA: Diagnosis not present

## 2021-10-03 DIAGNOSIS — Z09 Encounter for follow-up examination after completed treatment for conditions other than malignant neoplasm: Secondary | ICD-10-CM | POA: Diagnosis not present

## 2021-10-04 DIAGNOSIS — C7951 Secondary malignant neoplasm of bone: Secondary | ICD-10-CM | POA: Diagnosis not present

## 2021-10-04 DIAGNOSIS — C50919 Malignant neoplasm of unspecified site of unspecified female breast: Secondary | ICD-10-CM | POA: Diagnosis not present

## 2021-10-04 DIAGNOSIS — Z5111 Encounter for antineoplastic chemotherapy: Secondary | ICD-10-CM | POA: Diagnosis not present

## 2021-10-06 ENCOUNTER — Ambulatory Visit: Payer: Medicare Other | Admitting: Family Medicine

## 2021-10-20 ENCOUNTER — Ambulatory Visit: Payer: Medicare Other | Admitting: Family Medicine

## 2021-10-21 ENCOUNTER — Telehealth: Payer: Self-pay | Admitting: Family Medicine

## 2021-10-21 NOTE — Telephone Encounter (Signed)
Offered an appt with [name] on Monday the 9th. Pt has another am appt and request pm. [name] is overbooked. Scheduled with DOD- Dr. Livia Snellen at 1:55pm

## 2021-10-21 NOTE — Telephone Encounter (Signed)
Have her come be seen, get her an appointment ASAP or have her see the urgent care this weekend, if she feels like she has something growing in her mouth.  She needs to be seen either by [place] or the dentist.

## 2021-10-24 ENCOUNTER — Ambulatory Visit: Payer: Medicare Other | Admitting: Family Medicine

## 2021-10-28 ENCOUNTER — Other Ambulatory Visit: Payer: Self-pay | Admitting: Family Medicine

## 2021-10-28 DIAGNOSIS — J439 Emphysema, unspecified: Secondary | ICD-10-CM

## 2021-10-28 DIAGNOSIS — M1711 Unilateral primary osteoarthritis, right knee: Secondary | ICD-10-CM

## 2021-10-31 DIAGNOSIS — Z9221 Personal history of antineoplastic chemotherapy: Secondary | ICD-10-CM | POA: Diagnosis not present

## 2021-10-31 DIAGNOSIS — M47816 Spondylosis without myelopathy or radiculopathy, lumbar region: Secondary | ICD-10-CM | POA: Diagnosis not present

## 2021-10-31 DIAGNOSIS — C50919 Malignant neoplasm of unspecified site of unspecified female breast: Secondary | ICD-10-CM | POA: Diagnosis not present

## 2021-10-31 DIAGNOSIS — C50911 Malignant neoplasm of unspecified site of right female breast: Secondary | ICD-10-CM | POA: Diagnosis not present

## 2021-10-31 DIAGNOSIS — G893 Neoplasm related pain (acute) (chronic): Secondary | ICD-10-CM | POA: Diagnosis not present

## 2021-10-31 DIAGNOSIS — C7800 Secondary malignant neoplasm of unspecified lung: Secondary | ICD-10-CM | POA: Diagnosis not present

## 2021-10-31 DIAGNOSIS — Z23 Encounter for immunization: Secondary | ICD-10-CM | POA: Diagnosis not present

## 2021-10-31 DIAGNOSIS — C7951 Secondary malignant neoplasm of bone: Secondary | ICD-10-CM | POA: Diagnosis not present

## 2021-11-02 DIAGNOSIS — C50919 Malignant neoplasm of unspecified site of unspecified female breast: Secondary | ICD-10-CM | POA: Diagnosis not present

## 2021-11-02 DIAGNOSIS — Z5111 Encounter for antineoplastic chemotherapy: Secondary | ICD-10-CM | POA: Diagnosis not present

## 2021-11-10 ENCOUNTER — Ambulatory Visit: Payer: Medicare Other | Admitting: Family Medicine

## 2021-11-10 ENCOUNTER — Telehealth: Payer: Self-pay | Admitting: Family Medicine

## 2021-11-10 DIAGNOSIS — J439 Emphysema, unspecified: Secondary | ICD-10-CM

## 2021-11-10 DIAGNOSIS — F339 Major depressive disorder, recurrent, unspecified: Secondary | ICD-10-CM

## 2021-11-10 DIAGNOSIS — M1711 Unilateral primary osteoarthritis, right knee: Secondary | ICD-10-CM

## 2021-11-10 DIAGNOSIS — K219 Gastro-esophageal reflux disease without esophagitis: Secondary | ICD-10-CM

## 2021-11-10 NOTE — Telephone Encounter (Signed)
Left vm for pt to cb

## 2021-11-15 ENCOUNTER — Telehealth: Payer: Self-pay | Admitting: Family Medicine

## 2021-11-15 MED ORDER — ALBUTEROL SULFATE (2.5 MG/3ML) 0.083% IN NEBU: 2.5000 mg | INHALATION_SOLUTION | RESPIRATORY_TRACT | 0 refills | Status: DC | PRN

## 2021-11-15 MED ORDER — ALBUTEROL SULFATE HFA 108 (90 BASE) MCG/ACT IN AERS: 1.0000 | INHALATION_SPRAY | RESPIRATORY_TRACT | 0 refills | Status: DC | PRN

## 2021-11-15 MED ORDER — UMECLIDINIUM BROMIDE 62.5 MCG/ACT IN AEPB: 2.0000 | INHALATION_SPRAY | Freq: Every day | RESPIRATORY_TRACT | 0 refills | Status: DC

## 2021-11-15 MED ORDER — IBUPROFEN 800 MG PO TABS: ORAL_TABLET | ORAL | 0 refills | Status: DC

## 2021-11-15 MED ORDER — AMITRIPTYLINE HCL 25 MG PO TABS: ORAL_TABLET | ORAL | 0 refills | Status: DC

## 2021-11-15 MED ORDER — ESOMEPRAZOLE MAGNESIUM 40 MG PO CPDR: 40.0000 mg | DELAYED_RELEASE_CAPSULE | Freq: Every day | ORAL | 0 refills | Status: DC

## 2021-11-15 NOTE — Telephone Encounter (Signed)
Pt's lat OV was Sept of 22. Pt has an appt scheduled on 11/29 to follow up with [name]. One month supply of inhalers, PPI, IBU and amitriptyline sent to [facility]. Pt aware.

## 2021-11-15 NOTE — Telephone Encounter (Signed)
If patient completed her DEXA scan,  her PCP will get in touch to let her know results, looks like dexa was completed about a month ago.   For the bone growth in her mouth, patient need to schedule an OV for it to be assessed and treated appropriately. Thank you

## 2021-11-15 NOTE — Telephone Encounter (Signed)
Patient aware. Wants [name] to review last dexa scan. Dentist will not pull teeth unless she gets back on medication for bones. Per last dexa a year ago All patients should optimize calcium and vitamin D intake. Please advise

## 2021-11-15 NOTE — Addendum Note (Signed)
Addended by: Alphonzo Dublin on: 11/15/2021 08:45 AM   Modules accepted: Orders

## 2021-11-16 NOTE — Telephone Encounter (Signed)
Okay we can discuss options, she can come in and we can have a visit to discuss options or she can see Almyra Free for options, I am surprised that they would want her on it if they were going to do some dental work but if that is what the dentist wants then she can come in for visit to discuss all of the options

## 2021-11-16 NOTE — Telephone Encounter (Signed)
No answer, mailbox full

## 2021-11-21 NOTE — Telephone Encounter (Signed)
Attempts to contact pt without a return call in over 3 days, will close encounter. °

## 2021-11-29 DIAGNOSIS — C7951 Secondary malignant neoplasm of bone: Secondary | ICD-10-CM | POA: Diagnosis not present

## 2021-11-29 DIAGNOSIS — C7800 Secondary malignant neoplasm of unspecified lung: Secondary | ICD-10-CM | POA: Diagnosis not present

## 2021-11-29 DIAGNOSIS — F32A Depression, unspecified: Secondary | ICD-10-CM | POA: Diagnosis not present

## 2021-11-29 DIAGNOSIS — G894 Chronic pain syndrome: Secondary | ICD-10-CM | POA: Diagnosis not present

## 2021-11-29 DIAGNOSIS — G893 Neoplasm related pain (acute) (chronic): Secondary | ICD-10-CM | POA: Diagnosis not present

## 2021-11-29 DIAGNOSIS — M8580 Other specified disorders of bone density and structure, unspecified site: Secondary | ICD-10-CM | POA: Diagnosis not present

## 2021-11-29 DIAGNOSIS — C50919 Malignant neoplasm of unspecified site of unspecified female breast: Secondary | ICD-10-CM | POA: Diagnosis not present

## 2021-11-30 DIAGNOSIS — C50919 Malignant neoplasm of unspecified site of unspecified female breast: Secondary | ICD-10-CM | POA: Diagnosis not present

## 2021-11-30 DIAGNOSIS — Z5111 Encounter for antineoplastic chemotherapy: Secondary | ICD-10-CM | POA: Diagnosis not present

## 2021-11-30 DIAGNOSIS — C7951 Secondary malignant neoplasm of bone: Secondary | ICD-10-CM | POA: Diagnosis not present

## 2021-12-14 ENCOUNTER — Ambulatory Visit: Payer: Medicare Other | Admitting: Family Medicine

## 2022-01-20 ENCOUNTER — Ambulatory Visit: Payer: Medicare Other | Admitting: Family Medicine

## 2022-01-24 DIAGNOSIS — M549 Dorsalgia, unspecified: Secondary | ICD-10-CM | POA: Diagnosis not present

## 2022-01-24 DIAGNOSIS — J069 Acute upper respiratory infection, unspecified: Secondary | ICD-10-CM | POA: Diagnosis not present

## 2022-02-01 DIAGNOSIS — R590 Localized enlarged lymph nodes: Secondary | ICD-10-CM | POA: Diagnosis not present

## 2022-02-01 DIAGNOSIS — R918 Other nonspecific abnormal finding of lung field: Secondary | ICD-10-CM | POA: Diagnosis not present

## 2022-02-01 DIAGNOSIS — R1110 Vomiting, unspecified: Secondary | ICD-10-CM | POA: Diagnosis not present

## 2022-02-01 DIAGNOSIS — Z1152 Encounter for screening for COVID-19: Secondary | ICD-10-CM | POA: Diagnosis not present

## 2022-02-01 DIAGNOSIS — Z743 Need for continuous supervision: Secondary | ICD-10-CM | POA: Diagnosis not present

## 2022-02-01 DIAGNOSIS — R911 Solitary pulmonary nodule: Secondary | ICD-10-CM | POA: Diagnosis not present

## 2022-02-01 DIAGNOSIS — R110 Nausea: Secondary | ICD-10-CM | POA: Diagnosis not present

## 2022-02-01 DIAGNOSIS — K439 Ventral hernia without obstruction or gangrene: Secondary | ICD-10-CM | POA: Diagnosis not present

## 2022-02-01 DIAGNOSIS — R6889 Other general symptoms and signs: Secondary | ICD-10-CM | POA: Diagnosis not present

## 2022-02-01 DIAGNOSIS — N644 Mastodynia: Secondary | ICD-10-CM | POA: Diagnosis not present

## 2022-02-01 DIAGNOSIS — C7951 Secondary malignant neoplasm of bone: Secondary | ICD-10-CM | POA: Diagnosis not present

## 2022-02-01 DIAGNOSIS — R112 Nausea with vomiting, unspecified: Secondary | ICD-10-CM | POA: Diagnosis not present

## 2022-02-01 DIAGNOSIS — I700 Atherosclerosis of aorta: Secondary | ICD-10-CM | POA: Diagnosis not present

## 2022-02-01 DIAGNOSIS — C50919 Malignant neoplasm of unspecified site of unspecified female breast: Secondary | ICD-10-CM | POA: Diagnosis not present

## 2022-02-01 DIAGNOSIS — R1111 Vomiting without nausea: Secondary | ICD-10-CM | POA: Diagnosis not present

## 2022-02-01 DIAGNOSIS — C7981 Secondary malignant neoplasm of breast: Secondary | ICD-10-CM | POA: Diagnosis not present

## 2022-02-01 DIAGNOSIS — F17210 Nicotine dependence, cigarettes, uncomplicated: Secondary | ICD-10-CM | POA: Diagnosis not present

## 2022-02-01 DIAGNOSIS — R1010 Upper abdominal pain, unspecified: Secondary | ICD-10-CM | POA: Diagnosis not present

## 2022-02-01 DIAGNOSIS — Z20822 Contact with and (suspected) exposure to covid-19: Secondary | ICD-10-CM | POA: Diagnosis not present

## 2022-02-02 ENCOUNTER — Encounter (HOSPITAL_COMMUNITY): Payer: Self-pay | Admitting: Hematology

## 2022-02-03 DIAGNOSIS — C50111 Malignant neoplasm of central portion of right female breast: Secondary | ICD-10-CM | POA: Diagnosis not present

## 2022-02-03 DIAGNOSIS — M5414 Radiculopathy, thoracic region: Secondary | ICD-10-CM | POA: Diagnosis not present

## 2022-02-03 DIAGNOSIS — C7951 Secondary malignant neoplasm of bone: Secondary | ICD-10-CM | POA: Diagnosis not present

## 2022-02-03 DIAGNOSIS — C50919 Malignant neoplasm of unspecified site of unspecified female breast: Secondary | ICD-10-CM | POA: Diagnosis not present

## 2022-02-03 DIAGNOSIS — G893 Neoplasm related pain (acute) (chronic): Secondary | ICD-10-CM | POA: Diagnosis not present

## 2022-02-03 DIAGNOSIS — Z510 Encounter for antineoplastic radiation therapy: Secondary | ICD-10-CM | POA: Diagnosis not present

## 2022-02-03 DIAGNOSIS — G8929 Other chronic pain: Secondary | ICD-10-CM | POA: Diagnosis not present

## 2022-02-06 DIAGNOSIS — Z510 Encounter for antineoplastic radiation therapy: Secondary | ICD-10-CM | POA: Diagnosis not present

## 2022-02-06 DIAGNOSIS — M5414 Radiculopathy, thoracic region: Secondary | ICD-10-CM | POA: Diagnosis not present

## 2022-02-06 DIAGNOSIS — G893 Neoplasm related pain (acute) (chronic): Secondary | ICD-10-CM | POA: Diagnosis not present

## 2022-02-06 DIAGNOSIS — G8929 Other chronic pain: Secondary | ICD-10-CM | POA: Diagnosis not present

## 2022-02-06 DIAGNOSIS — C50919 Malignant neoplasm of unspecified site of unspecified female breast: Secondary | ICD-10-CM | POA: Diagnosis not present

## 2022-02-06 DIAGNOSIS — C50111 Malignant neoplasm of central portion of right female breast: Secondary | ICD-10-CM | POA: Diagnosis not present

## 2022-02-06 DIAGNOSIS — C7951 Secondary malignant neoplasm of bone: Secondary | ICD-10-CM | POA: Diagnosis not present

## 2022-02-07 DIAGNOSIS — C7801 Secondary malignant neoplasm of right lung: Secondary | ICD-10-CM | POA: Diagnosis not present

## 2022-02-07 DIAGNOSIS — C50919 Malignant neoplasm of unspecified site of unspecified female breast: Secondary | ICD-10-CM | POA: Diagnosis not present

## 2022-02-07 DIAGNOSIS — C7802 Secondary malignant neoplasm of left lung: Secondary | ICD-10-CM | POA: Diagnosis not present

## 2022-02-07 DIAGNOSIS — G893 Neoplasm related pain (acute) (chronic): Secondary | ICD-10-CM | POA: Diagnosis not present

## 2022-02-07 DIAGNOSIS — C7951 Secondary malignant neoplasm of bone: Secondary | ICD-10-CM | POA: Diagnosis not present

## 2022-02-08 DIAGNOSIS — C799 Secondary malignant neoplasm of unspecified site: Secondary | ICD-10-CM | POA: Diagnosis not present

## 2022-02-08 DIAGNOSIS — Z5111 Encounter for antineoplastic chemotherapy: Secondary | ICD-10-CM | POA: Diagnosis not present

## 2022-02-08 DIAGNOSIS — C50919 Malignant neoplasm of unspecified site of unspecified female breast: Secondary | ICD-10-CM | POA: Diagnosis not present

## 2022-02-09 ENCOUNTER — Ambulatory Visit: Payer: 59 | Admitting: Family Medicine

## 2022-02-09 ENCOUNTER — Telehealth: Payer: Self-pay | Admitting: Family Medicine

## 2022-02-09 DIAGNOSIS — Z510 Encounter for antineoplastic radiation therapy: Secondary | ICD-10-CM | POA: Diagnosis not present

## 2022-02-09 DIAGNOSIS — C50919 Malignant neoplasm of unspecified site of unspecified female breast: Secondary | ICD-10-CM | POA: Diagnosis not present

## 2022-02-09 DIAGNOSIS — M5414 Radiculopathy, thoracic region: Secondary | ICD-10-CM | POA: Diagnosis not present

## 2022-02-09 DIAGNOSIS — G8929 Other chronic pain: Secondary | ICD-10-CM | POA: Diagnosis not present

## 2022-02-09 DIAGNOSIS — G893 Neoplasm related pain (acute) (chronic): Secondary | ICD-10-CM | POA: Diagnosis not present

## 2022-02-09 DIAGNOSIS — C50111 Malignant neoplasm of central portion of right female breast: Secondary | ICD-10-CM | POA: Diagnosis not present

## 2022-02-09 DIAGNOSIS — J439 Emphysema, unspecified: Secondary | ICD-10-CM

## 2022-02-09 DIAGNOSIS — M1711 Unilateral primary osteoarthritis, right knee: Secondary | ICD-10-CM

## 2022-02-09 DIAGNOSIS — C7951 Secondary malignant neoplasm of bone: Secondary | ICD-10-CM | POA: Diagnosis not present

## 2022-02-09 MED ORDER — UMECLIDINIUM BROMIDE 62.5 MCG/ACT IN AEPB: 2.0000 | INHALATION_SPRAY | Freq: Every day | RESPIRATORY_TRACT | 0 refills | Status: DC

## 2022-02-09 MED ORDER — ONDANSETRON 8 MG PO TBDP: ORAL_TABLET | ORAL | 1 refills | Status: DC

## 2022-02-09 MED ORDER — BUDESONIDE-FORMOTEROL FUMARATE 160-4.5 MCG/ACT IN AERO: INHALATION_SPRAY | RESPIRATORY_TRACT | 0 refills | Status: DC

## 2022-02-09 MED ORDER — IBUPROFEN 800 MG PO TABS: ORAL_TABLET | ORAL | 0 refills | Status: DC

## 2022-02-09 NOTE — Telephone Encounter (Signed)
One month supply sent until next appt.

## 2022-02-09 NOTE — Telephone Encounter (Signed)
Patient has appointment scheduled for 03/15/22 and will need medication refilled for ibuprofen (IBU) 800 MG tablet   albuterol (VENTOLIN HFA) 108 (90 Base) MCG/ACT inhaler  umeclidinium bromide (INCRUSE ELLIPTA) 62.5 MCG/ACT AEPB  budesonide-formoterol (SYMBICORT) 160-4.5 MCG/ACT inhaler  ondansetron (pt is doing chemo and needs this) until his appointment date.  Please advise.  [facility]

## 2022-02-13 DIAGNOSIS — M5414 Radiculopathy, thoracic region: Secondary | ICD-10-CM | POA: Diagnosis not present

## 2022-02-13 DIAGNOSIS — G8929 Other chronic pain: Secondary | ICD-10-CM | POA: Diagnosis not present

## 2022-02-13 DIAGNOSIS — C50919 Malignant neoplasm of unspecified site of unspecified female breast: Secondary | ICD-10-CM | POA: Diagnosis not present

## 2022-02-13 DIAGNOSIS — G893 Neoplasm related pain (acute) (chronic): Secondary | ICD-10-CM | POA: Diagnosis not present

## 2022-02-13 DIAGNOSIS — C7951 Secondary malignant neoplasm of bone: Secondary | ICD-10-CM | POA: Diagnosis not present

## 2022-02-13 DIAGNOSIS — Z510 Encounter for antineoplastic radiation therapy: Secondary | ICD-10-CM | POA: Diagnosis not present

## 2022-02-13 DIAGNOSIS — C50111 Malignant neoplasm of central portion of right female breast: Secondary | ICD-10-CM | POA: Diagnosis not present

## 2022-02-14 DIAGNOSIS — Z510 Encounter for antineoplastic radiation therapy: Secondary | ICD-10-CM | POA: Diagnosis not present

## 2022-02-14 DIAGNOSIS — C50111 Malignant neoplasm of central portion of right female breast: Secondary | ICD-10-CM | POA: Diagnosis not present

## 2022-02-14 DIAGNOSIS — C7951 Secondary malignant neoplasm of bone: Secondary | ICD-10-CM | POA: Diagnosis not present

## 2022-02-14 DIAGNOSIS — M5414 Radiculopathy, thoracic region: Secondary | ICD-10-CM | POA: Diagnosis not present

## 2022-02-14 DIAGNOSIS — G893 Neoplasm related pain (acute) (chronic): Secondary | ICD-10-CM | POA: Diagnosis not present

## 2022-02-14 DIAGNOSIS — G8929 Other chronic pain: Secondary | ICD-10-CM | POA: Diagnosis not present

## 2022-02-14 DIAGNOSIS — C50919 Malignant neoplasm of unspecified site of unspecified female breast: Secondary | ICD-10-CM | POA: Diagnosis not present

## 2022-02-15 DIAGNOSIS — Z510 Encounter for antineoplastic radiation therapy: Secondary | ICD-10-CM | POA: Diagnosis not present

## 2022-02-15 DIAGNOSIS — M5414 Radiculopathy, thoracic region: Secondary | ICD-10-CM | POA: Diagnosis not present

## 2022-02-15 DIAGNOSIS — C7951 Secondary malignant neoplasm of bone: Secondary | ICD-10-CM | POA: Diagnosis not present

## 2022-02-15 DIAGNOSIS — G8929 Other chronic pain: Secondary | ICD-10-CM | POA: Diagnosis not present

## 2022-02-15 DIAGNOSIS — C50111 Malignant neoplasm of central portion of right female breast: Secondary | ICD-10-CM | POA: Diagnosis not present

## 2022-02-15 DIAGNOSIS — C50919 Malignant neoplasm of unspecified site of unspecified female breast: Secondary | ICD-10-CM | POA: Diagnosis not present

## 2022-02-15 DIAGNOSIS — G893 Neoplasm related pain (acute) (chronic): Secondary | ICD-10-CM | POA: Diagnosis not present

## 2022-02-17 DIAGNOSIS — C50919 Malignant neoplasm of unspecified site of unspecified female breast: Secondary | ICD-10-CM | POA: Diagnosis not present

## 2022-02-21 DIAGNOSIS — M5414 Radiculopathy, thoracic region: Secondary | ICD-10-CM | POA: Diagnosis not present

## 2022-02-21 DIAGNOSIS — G893 Neoplasm related pain (acute) (chronic): Secondary | ICD-10-CM | POA: Diagnosis not present

## 2022-02-21 DIAGNOSIS — C7951 Secondary malignant neoplasm of bone: Secondary | ICD-10-CM | POA: Diagnosis not present

## 2022-02-21 DIAGNOSIS — C50919 Malignant neoplasm of unspecified site of unspecified female breast: Secondary | ICD-10-CM | POA: Diagnosis not present

## 2022-02-21 DIAGNOSIS — Z510 Encounter for antineoplastic radiation therapy: Secondary | ICD-10-CM | POA: Diagnosis not present

## 2022-02-21 DIAGNOSIS — C50111 Malignant neoplasm of central portion of right female breast: Secondary | ICD-10-CM | POA: Diagnosis not present

## 2022-02-21 DIAGNOSIS — G8929 Other chronic pain: Secondary | ICD-10-CM | POA: Diagnosis not present

## 2022-02-22 DIAGNOSIS — C7951 Secondary malignant neoplasm of bone: Secondary | ICD-10-CM | POA: Diagnosis not present

## 2022-02-22 DIAGNOSIS — C50111 Malignant neoplasm of central portion of right female breast: Secondary | ICD-10-CM | POA: Diagnosis not present

## 2022-02-22 DIAGNOSIS — G893 Neoplasm related pain (acute) (chronic): Secondary | ICD-10-CM | POA: Diagnosis not present

## 2022-02-22 DIAGNOSIS — G8929 Other chronic pain: Secondary | ICD-10-CM | POA: Diagnosis not present

## 2022-02-22 DIAGNOSIS — M5414 Radiculopathy, thoracic region: Secondary | ICD-10-CM | POA: Diagnosis not present

## 2022-02-22 DIAGNOSIS — Z510 Encounter for antineoplastic radiation therapy: Secondary | ICD-10-CM | POA: Diagnosis not present

## 2022-02-22 DIAGNOSIS — C50919 Malignant neoplasm of unspecified site of unspecified female breast: Secondary | ICD-10-CM | POA: Diagnosis not present

## 2022-02-23 DIAGNOSIS — Z510 Encounter for antineoplastic radiation therapy: Secondary | ICD-10-CM | POA: Diagnosis not present

## 2022-02-23 DIAGNOSIS — G893 Neoplasm related pain (acute) (chronic): Secondary | ICD-10-CM | POA: Diagnosis not present

## 2022-02-23 DIAGNOSIS — G8929 Other chronic pain: Secondary | ICD-10-CM | POA: Diagnosis not present

## 2022-02-23 DIAGNOSIS — C50111 Malignant neoplasm of central portion of right female breast: Secondary | ICD-10-CM | POA: Diagnosis not present

## 2022-02-23 DIAGNOSIS — C7951 Secondary malignant neoplasm of bone: Secondary | ICD-10-CM | POA: Diagnosis not present

## 2022-02-23 DIAGNOSIS — C50919 Malignant neoplasm of unspecified site of unspecified female breast: Secondary | ICD-10-CM | POA: Diagnosis not present

## 2022-02-23 DIAGNOSIS — M5414 Radiculopathy, thoracic region: Secondary | ICD-10-CM | POA: Diagnosis not present

## 2022-02-24 DIAGNOSIS — G8929 Other chronic pain: Secondary | ICD-10-CM | POA: Diagnosis not present

## 2022-02-24 DIAGNOSIS — C50919 Malignant neoplasm of unspecified site of unspecified female breast: Secondary | ICD-10-CM | POA: Diagnosis not present

## 2022-02-24 DIAGNOSIS — M5414 Radiculopathy, thoracic region: Secondary | ICD-10-CM | POA: Diagnosis not present

## 2022-02-24 DIAGNOSIS — Z510 Encounter for antineoplastic radiation therapy: Secondary | ICD-10-CM | POA: Diagnosis not present

## 2022-02-24 DIAGNOSIS — G893 Neoplasm related pain (acute) (chronic): Secondary | ICD-10-CM | POA: Diagnosis not present

## 2022-02-24 DIAGNOSIS — C7951 Secondary malignant neoplasm of bone: Secondary | ICD-10-CM | POA: Diagnosis not present

## 2022-02-24 DIAGNOSIS — C50111 Malignant neoplasm of central portion of right female breast: Secondary | ICD-10-CM | POA: Diagnosis not present

## 2022-02-28 DIAGNOSIS — G8929 Other chronic pain: Secondary | ICD-10-CM | POA: Diagnosis not present

## 2022-02-28 DIAGNOSIS — Z510 Encounter for antineoplastic radiation therapy: Secondary | ICD-10-CM | POA: Diagnosis not present

## 2022-02-28 DIAGNOSIS — C50111 Malignant neoplasm of central portion of right female breast: Secondary | ICD-10-CM | POA: Diagnosis not present

## 2022-02-28 DIAGNOSIS — C7951 Secondary malignant neoplasm of bone: Secondary | ICD-10-CM | POA: Diagnosis not present

## 2022-02-28 DIAGNOSIS — C50919 Malignant neoplasm of unspecified site of unspecified female breast: Secondary | ICD-10-CM | POA: Diagnosis not present

## 2022-02-28 DIAGNOSIS — G893 Neoplasm related pain (acute) (chronic): Secondary | ICD-10-CM | POA: Diagnosis not present

## 2022-02-28 DIAGNOSIS — M5414 Radiculopathy, thoracic region: Secondary | ICD-10-CM | POA: Diagnosis not present

## 2022-03-01 DIAGNOSIS — M5414 Radiculopathy, thoracic region: Secondary | ICD-10-CM | POA: Diagnosis not present

## 2022-03-01 DIAGNOSIS — C7951 Secondary malignant neoplasm of bone: Secondary | ICD-10-CM | POA: Diagnosis not present

## 2022-03-01 DIAGNOSIS — G893 Neoplasm related pain (acute) (chronic): Secondary | ICD-10-CM | POA: Diagnosis not present

## 2022-03-01 DIAGNOSIS — C50919 Malignant neoplasm of unspecified site of unspecified female breast: Secondary | ICD-10-CM | POA: Diagnosis not present

## 2022-03-01 DIAGNOSIS — Z510 Encounter for antineoplastic radiation therapy: Secondary | ICD-10-CM | POA: Diagnosis not present

## 2022-03-01 DIAGNOSIS — C50111 Malignant neoplasm of central portion of right female breast: Secondary | ICD-10-CM | POA: Diagnosis not present

## 2022-03-01 DIAGNOSIS — G8929 Other chronic pain: Secondary | ICD-10-CM | POA: Diagnosis not present

## 2022-03-02 DIAGNOSIS — C7951 Secondary malignant neoplasm of bone: Secondary | ICD-10-CM | POA: Diagnosis not present

## 2022-03-02 DIAGNOSIS — Z510 Encounter for antineoplastic radiation therapy: Secondary | ICD-10-CM | POA: Diagnosis not present

## 2022-03-02 DIAGNOSIS — G8929 Other chronic pain: Secondary | ICD-10-CM | POA: Diagnosis not present

## 2022-03-02 DIAGNOSIS — C50919 Malignant neoplasm of unspecified site of unspecified female breast: Secondary | ICD-10-CM | POA: Diagnosis not present

## 2022-03-02 DIAGNOSIS — C50111 Malignant neoplasm of central portion of right female breast: Secondary | ICD-10-CM | POA: Diagnosis not present

## 2022-03-02 DIAGNOSIS — G893 Neoplasm related pain (acute) (chronic): Secondary | ICD-10-CM | POA: Diagnosis not present

## 2022-03-02 DIAGNOSIS — M5414 Radiculopathy, thoracic region: Secondary | ICD-10-CM | POA: Diagnosis not present

## 2022-03-07 DIAGNOSIS — C7951 Secondary malignant neoplasm of bone: Secondary | ICD-10-CM | POA: Diagnosis not present

## 2022-03-07 DIAGNOSIS — R799 Abnormal finding of blood chemistry, unspecified: Secondary | ICD-10-CM | POA: Diagnosis not present

## 2022-03-07 DIAGNOSIS — C50919 Malignant neoplasm of unspecified site of unspecified female breast: Secondary | ICD-10-CM | POA: Diagnosis not present

## 2022-03-07 DIAGNOSIS — C7800 Secondary malignant neoplasm of unspecified lung: Secondary | ICD-10-CM | POA: Diagnosis not present

## 2022-03-09 DIAGNOSIS — M84454D Pathological fracture, pelvis, subsequent encounter for fracture with routine healing: Secondary | ICD-10-CM | POA: Diagnosis not present

## 2022-03-09 DIAGNOSIS — M79605 Pain in left leg: Secondary | ICD-10-CM | POA: Diagnosis not present

## 2022-03-09 DIAGNOSIS — M899 Disorder of bone, unspecified: Secondary | ICD-10-CM | POA: Diagnosis not present

## 2022-03-09 DIAGNOSIS — C799 Secondary malignant neoplasm of unspecified site: Secondary | ICD-10-CM | POA: Diagnosis not present

## 2022-03-09 DIAGNOSIS — C50919 Malignant neoplasm of unspecified site of unspecified female breast: Secondary | ICD-10-CM | POA: Diagnosis not present

## 2022-03-15 ENCOUNTER — Ambulatory Visit: Payer: 59 | Admitting: Family Medicine

## 2022-03-16 ENCOUNTER — Encounter: Payer: Self-pay | Admitting: Radiology

## 2022-03-20 ENCOUNTER — Encounter: Payer: Self-pay | Admitting: Family Medicine

## 2022-03-23 DIAGNOSIS — C7951 Secondary malignant neoplasm of bone: Secondary | ICD-10-CM | POA: Diagnosis not present

## 2022-03-23 DIAGNOSIS — R531 Weakness: Secondary | ICD-10-CM | POA: Diagnosis not present

## 2022-03-23 DIAGNOSIS — G893 Neoplasm related pain (acute) (chronic): Secondary | ICD-10-CM | POA: Diagnosis not present

## 2022-03-23 DIAGNOSIS — Z515 Encounter for palliative care: Secondary | ICD-10-CM | POA: Diagnosis not present

## 2022-03-23 DIAGNOSIS — C7800 Secondary malignant neoplasm of unspecified lung: Secondary | ICD-10-CM | POA: Diagnosis not present

## 2022-03-23 DIAGNOSIS — R634 Abnormal weight loss: Secondary | ICD-10-CM | POA: Diagnosis not present

## 2022-03-23 DIAGNOSIS — C50919 Malignant neoplasm of unspecified site of unspecified female breast: Secondary | ICD-10-CM | POA: Diagnosis not present

## 2022-03-24 ENCOUNTER — Encounter: Payer: Self-pay | Admitting: Family Medicine

## 2022-03-24 ENCOUNTER — Ambulatory Visit (INDEPENDENT_AMBULATORY_CARE_PROVIDER_SITE_OTHER): Payer: 59 | Admitting: Family Medicine

## 2022-03-24 VITALS — BP 111/74 | HR 76 | Ht 67.0 in | Wt 187.0 lb

## 2022-03-24 DIAGNOSIS — M1711 Unilateral primary osteoarthritis, right knee: Secondary | ICD-10-CM

## 2022-03-24 DIAGNOSIS — J449 Chronic obstructive pulmonary disease, unspecified: Secondary | ICD-10-CM | POA: Diagnosis not present

## 2022-03-24 DIAGNOSIS — G4700 Insomnia, unspecified: Secondary | ICD-10-CM | POA: Diagnosis not present

## 2022-03-24 DIAGNOSIS — C50919 Malignant neoplasm of unspecified site of unspecified female breast: Secondary | ICD-10-CM

## 2022-03-24 DIAGNOSIS — Z23 Encounter for immunization: Secondary | ICD-10-CM

## 2022-03-24 DIAGNOSIS — J439 Emphysema, unspecified: Secondary | ICD-10-CM

## 2022-03-24 MED ORDER — DICLOFENAC SODIUM 1 % EX GEL: 2.0000 g | Freq: Four times a day (QID) | CUTANEOUS | 3 refills | Status: DC | PRN

## 2022-03-24 MED ORDER — ALBUTEROL SULFATE HFA 108 (90 BASE) MCG/ACT IN AERS: 1.0000 | INHALATION_SPRAY | RESPIRATORY_TRACT | 2 refills | Status: DC | PRN

## 2022-03-24 MED ORDER — TEMAZEPAM 15 MG PO CAPS: 15.0000 mg | ORAL_CAPSULE | Freq: Every evening | ORAL | 5 refills | Status: DC | PRN

## 2022-03-24 MED ORDER — UMECLIDINIUM BROMIDE 62.5 MCG/ACT IN AEPB: 2.0000 | INHALATION_SPRAY | Freq: Every day | RESPIRATORY_TRACT | 3 refills | Status: DC

## 2022-03-24 MED ORDER — IBUPROFEN 800 MG PO TABS: 800.0000 mg | ORAL_TABLET | Freq: Three times a day (TID) | ORAL | 3 refills | Status: DC | PRN

## 2022-03-24 NOTE — Progress Notes (Addendum)
BP 111/74   Pulse 76   Ht 5\' 7"  (1.702 m)   Wt 187 lb (84.8 kg)   SpO2 97%   BMI 29.29 kg/m    Subjective:   Patient ID: Tracy Neal, female    DOB: September 14, 1955, 66 y.o.   MRN: [id number]  HPI: Tracy Neal is a 67 y.o. female presenting on 03/24/2022 for Medical Management of Chronic Issues   HPI Malignant neoplasm of breast cancer with metastasis to the bone and liver. Patient is coming in for malignant neoplasm and bone metastasis.  She is not doing any more chemotherapy and is on hospice and palliative care.  End-of-life care at this point.  She says the biggest pain is in her back and down her left leg.  Her breathing is stable and she is wants refills on inhalers so she can have them to help with comfort and needed. She has insomnia and wants medication to help with insomnia.  They said she cannot take the amitriptyline because of interaction with the Lexapro.  She is complaining of difficulty with getting up and out of her bed because of strength going through the chemotherapy and everything and the pain with metastasis.  She would like to have a commode to help lift her out of bed and depends and wipes to help clean her as well.  Relevant past medical, surgical, family and social history reviewed and updated as indicated. Interim medical history since our last visit reviewed. Allergies and medications reviewed and updated.  Review of Systems  Constitutional:  Negative for chills and fever.  HENT:  Positive for congestion. Negative for ear discharge and ear pain.   Eyes:  Negative for redness and visual disturbance.  Respiratory:  Positive for cough, shortness of breath and wheezing. Negative for chest tightness.   Cardiovascular:  Negative for chest pain and leg swelling.  Genitourinary:  Negative for difficulty urinating and dysuria.  Musculoskeletal:  Positive for arthralgias, back pain and myalgias. Negative for gait problem.  Skin:  Negative for rash.   Neurological:  Negative for light-headedness and headaches.  Psychiatric/Behavioral:  Positive for dysphoric mood and sleep disturbance. Negative for agitation and behavioral problems. The patient is nervous/anxious.   All other systems reviewed and are negative.   Per HPI unless specifically indicated above   Allergies as of 03/24/2022       Reactions   Percocet [oxycodone-acetaminophen] Itching, Nausea And Vomiting   Feels like bugs crawling on skin        Medication List        Accurate as of March 24, 2022 11:59 PM. If you have any questions, ask your nurse or doctor.          STOP taking these medications    amitriptyline 25 MG tablet Commonly known as: ELAVIL Stopped by: Worthy Rancher, MD   budesonide-formoterol 160-4.5 MCG/ACT inhaler Commonly known as: Symbicort Stopped by: Worthy Rancher, MD   CALCIUM GUMMIES PO Stopped by: Worthy Rancher, MD   CALCIUM+D3 PO Stopped by: Fransisca Kaufmann Nayquan Evinger, MD   celecoxib 200 MG capsule Commonly known as: CELEBREX Stopped by: Worthy Rancher, MD   cyanocobalamin 1000 MCG tablet Commonly known as: VITAMIN B12 Stopped by: Worthy Rancher, MD   doxycycline 100 MG capsule Commonly known as: VIBRAMYCIN Stopped by: Worthy Rancher, MD   esomeprazole 40 MG capsule Commonly known as: [place] by: Worthy Rancher, MD   HYDROcodone-acetaminophen 7.5-325 MG tablet Commonly known  as: NORCO Stopped by: Worthy Rancher, MD   Ibrance 125 MG capsule Generic drug: palbociclib Stopped by: Fransisca Kaufmann Rikia Sukhu, MD   Ilevro 0.3 % ophthalmic suspension Generic drug: nepafenac Stopped by: Fransisca Kaufmann Trevel Dillenbeck, MD   moxifloxacin 0.5 % ophthalmic solution Commonly known as: VIGAMOX Stopped by: Fransisca Kaufmann Ashli Selders, MD   ondansetron 8 MG disintegrating tablet Commonly known as: ZOFRAN-ODT Stopped by: Worthy Rancher, MD   PARoxetine 20 MG tablet Commonly known as: PAXIL Stopped by: Fransisca Kaufmann  Esme Durkin, MD   prednisoLONE acetate 1 % ophthalmic suspension Commonly known as: PRED FORTE Stopped by: Fransisca Kaufmann Alexandr Yaworski, MD   predniSONE 20 MG tablet Commonly known as: DELTASONE Stopped by: Worthy Rancher, MD       TAKE these medications    albuterol 108 (90 Base) MCG/ACT inhaler Commonly known as: VENTOLIN HFA Inhale 1-2 puffs into the lungs every 4 (four) hours as needed for wheezing or shortness of breath. What changed: Another medication with the same name was removed. Continue taking this medication, and follow the directions you see here. Changed by: Fransisca Kaufmann Cylee Dattilo, MD   diclofenac Sodium 1 % Gel Commonly known as: Voltaren Apply 2 g topically 4 (four) times daily as needed (pain.).   escitalopram 10 MG tablet Commonly known as: LEXAPRO Take 10 mg by mouth daily.   ibuprofen 800 MG tablet Commonly known as: ADVIL Take 1 tablet (800 mg total) by mouth every 8 (eight) hours as needed. What changed:  how much to take how to take this when to take this reasons to take this additional instructions Changed by: Fransisca Kaufmann Vee Bahe, MD   NON FORMULARY Take 1 Dose by mouth daily. Rozze- cholesterol vitamin   omeprazole 20 MG capsule Commonly known as: PRILOSEC Take 20 mg by mouth daily.   ondansetron 4 MG tablet Commonly known as: ZOFRAN Take 4 mg by mouth every 8 (eight) hours as needed for nausea or vomiting.   temazepam 15 MG capsule Commonly known as: RESTORIL Take 1 capsule (15 mg total) by mouth at bedtime as needed for sleep. Started by: Fransisca Kaufmann Joaquin Knebel, MD   umeclidinium bromide 62.5 MCG/ACT Aepb Commonly known as: INCRUSE ELLIPTA Inhale 2 puffs into the lungs daily.         Objective:   BP 111/74   Pulse 76   Ht 5\' 7"  (1.702 m)   Wt 187 lb (84.8 kg)   SpO2 97%   BMI 29.29 kg/m   Wt Readings from Last 3 Encounters:  03/24/22 187 lb (84.8 kg)  03/26/21 240 lb (108.9 kg)  02/11/21 245 lb (111.1 kg)    Physical  Exam Vitals and nursing note reviewed.  Constitutional:      General: She is not in acute distress.    Appearance: She is well-developed. She is not diaphoretic.  Eyes:     Conjunctiva/sclera: Conjunctivae normal.  Cardiovascular:     Rate and Rhythm: Normal rate and regular rhythm.     Heart sounds: Normal heart sounds. No murmur heard. Pulmonary:     Effort: Pulmonary effort is normal. No respiratory distress.     Breath sounds: Normal breath sounds. No wheezing.  Musculoskeletal:        General: No swelling or tenderness. Normal range of motion.  Skin:    General: Skin is warm and dry.     Findings: No rash.  Neurological:     Mental Status: She is alert and oriented to person, place, and time.  Coordination: Coordination normal.  Psychiatric:        Mood and Affect: Mood is anxious and depressed.        Behavior: Behavior normal.        Thought Content: Thought content does not include suicidal ideation. Thought content does not include suicidal plan.       Assessment & Plan:   Problem List Items Addressed This Visit       Respiratory   COPD (chronic obstructive pulmonary disease) ([place])   Relevant Medications   albuterol (VENTOLIN HFA) 108 (90 Base) MCG/ACT inhaler   umeclidinium bromide (INCRUSE ELLIPTA) 62.5 MCG/ACT AEPB     Musculoskeletal and Integument   Osteoarthritis of right knee   Relevant Medications   diclofenac Sodium (VOLTAREN) 1 % GEL   ibuprofen (ADVIL) 800 MG tablet     Other   Primary malignant neoplasm of breast with metastasis (HCC) - Primary   Relevant Medications   ondansetron (ZOFRAN) 4 MG tablet   ibuprofen (ADVIL) 800 MG tablet   Other Visit Diagnoses     Insomnia, unspecified type       Relevant Medications   temazepam (RESTORIL) 15 MG capsule   Need for shingles vaccine       Relevant Orders   Zoster Recombinant (Shingrix ) (Completed)       End stage can advance end-stage cancer patient on hospice care, will give  trazodone to help with sleep.  Discussed possible interaction with pain medicines and she understands.  Use as needed  Refilled other comfort care medicines.  Sent a prescription for bed pads and diapers and wipes and other supplies. Follow up plan: Return in about 6 months (around 09/24/2022), or if symptoms worsen or fail to improve, for For insomnia recheck.  Counseling provided for all of the vaccine components Orders Placed This Encounter  Procedures   Zoster Recombinant (Shingrix )    Caryl Pina, MD [facility] 03/26/2022, 10:20 PM

## 2022-03-30 ENCOUNTER — Telehealth: Payer: Self-pay | Admitting: Family Medicine

## 2022-03-30 NOTE — Telephone Encounter (Signed)
Called patient to schedule Medicare Annual Wellness Visit (AWV). No voicemail available to leave a message.  Last date of AWV: 02/11/2021   Please schedule an appointment at any time with either Mickel Baas or [place], NHA's. .  If any questions, please contact me at [phone].  Thank you,  Colletta [place],  [facility] Direct Dial ??[phone]

## 2022-04-07 ENCOUNTER — Encounter: Payer: Self-pay | Admitting: Family Medicine

## 2022-04-10 ENCOUNTER — Telehealth: Payer: Self-pay | Admitting: Family Medicine

## 2022-04-12 MED ORDER — ENSURE COMPLETE PO LIQD: 237.0000 mL | ORAL | 3 refills | Status: DC

## 2022-04-12 NOTE — Telephone Encounter (Signed)
Printed prescription for DME supplies and sent prescription to pharmacy for Ensure.

## 2022-04-12 NOTE — Telephone Encounter (Signed)
Patient aware and verbalized understanding.all supplies faxed

## 2022-04-12 NOTE — Addendum Note (Signed)
Addended by: Caryl Pina on: 04/12/2022 03:14 PM   Modules accepted: Orders

## 2022-04-12 NOTE — Telephone Encounter (Signed)
Waiting for [name] to sign

## 2022-04-14 DIAGNOSIS — C7951 Secondary malignant neoplasm of bone: Secondary | ICD-10-CM | POA: Diagnosis not present

## 2022-04-14 DIAGNOSIS — R531 Weakness: Secondary | ICD-10-CM | POA: Diagnosis not present

## 2022-04-14 DIAGNOSIS — G893 Neoplasm related pain (acute) (chronic): Secondary | ICD-10-CM | POA: Diagnosis not present

## 2022-04-14 DIAGNOSIS — R634 Abnormal weight loss: Secondary | ICD-10-CM | POA: Diagnosis not present

## 2022-04-14 DIAGNOSIS — Z515 Encounter for palliative care: Secondary | ICD-10-CM | POA: Diagnosis not present

## 2022-04-14 DIAGNOSIS — C50919 Malignant neoplasm of unspecified site of unspecified female breast: Secondary | ICD-10-CM | POA: Diagnosis not present

## 2022-04-14 DIAGNOSIS — C7800 Secondary malignant neoplasm of unspecified lung: Secondary | ICD-10-CM | POA: Diagnosis not present

## 2022-04-18 DIAGNOSIS — M533 Sacrococcygeal disorders, not elsewhere classified: Secondary | ICD-10-CM | POA: Diagnosis not present

## 2022-04-18 DIAGNOSIS — C50919 Malignant neoplasm of unspecified site of unspecified female breast: Secondary | ICD-10-CM | POA: Diagnosis not present

## 2022-04-18 DIAGNOSIS — C7800 Secondary malignant neoplasm of unspecified lung: Secondary | ICD-10-CM | POA: Diagnosis not present

## 2022-04-18 DIAGNOSIS — C7951 Secondary malignant neoplasm of bone: Secondary | ICD-10-CM | POA: Diagnosis not present

## 2022-04-18 DIAGNOSIS — Z09 Encounter for follow-up examination after completed treatment for conditions other than malignant neoplasm: Secondary | ICD-10-CM | POA: Diagnosis not present

## 2022-04-24 DIAGNOSIS — G894 Chronic pain syndrome: Secondary | ICD-10-CM | POA: Diagnosis not present

## 2022-04-24 DIAGNOSIS — C50919 Malignant neoplasm of unspecified site of unspecified female breast: Secondary | ICD-10-CM | POA: Diagnosis not present

## 2022-04-24 DIAGNOSIS — R0781 Pleurodynia: Secondary | ICD-10-CM | POA: Diagnosis not present

## 2022-04-24 DIAGNOSIS — M533 Sacrococcygeal disorders, not elsewhere classified: Secondary | ICD-10-CM | POA: Diagnosis not present

## 2022-04-28 DIAGNOSIS — C7800 Secondary malignant neoplasm of unspecified lung: Secondary | ICD-10-CM | POA: Diagnosis not present

## 2022-04-28 DIAGNOSIS — G893 Neoplasm related pain (acute) (chronic): Secondary | ICD-10-CM | POA: Diagnosis not present

## 2022-04-28 DIAGNOSIS — R112 Nausea with vomiting, unspecified: Secondary | ICD-10-CM | POA: Diagnosis not present

## 2022-04-28 DIAGNOSIS — C7951 Secondary malignant neoplasm of bone: Secondary | ICD-10-CM | POA: Diagnosis not present

## 2022-04-28 DIAGNOSIS — C50919 Malignant neoplasm of unspecified site of unspecified female breast: Secondary | ICD-10-CM | POA: Diagnosis not present

## 2022-04-28 DIAGNOSIS — Z515 Encounter for palliative care: Secondary | ICD-10-CM | POA: Diagnosis not present

## 2022-05-08 ENCOUNTER — Telehealth: Payer: Self-pay | Admitting: Family Medicine

## 2022-05-08 NOTE — Telephone Encounter (Signed)
Called patient to schedule Medicare Annual Wellness Visit (AWV). No voicemail available to leave a message.  Last date of AWV: 02/11/2021   Please schedule an appointment at any time with either Laura or Courtney, NHA's. .  If any questions, please contact me at [phone].  Thank you,  Stephanie,  [facility] Clinical Support [facility] AWV Program Direct Dial ??[phone]    

## 2022-05-15 IMAGING — DX DG CHEST 1V PORT
1 series · 1 of 1 positions shown · non-contrast
Comparison: 07/09/2012

CLINICAL DATA: 63-year-old female with acute shortness of breath.
History of breast cancer.

EXAM:
PORTABLE CHEST 1 VIEW

[chest ap]
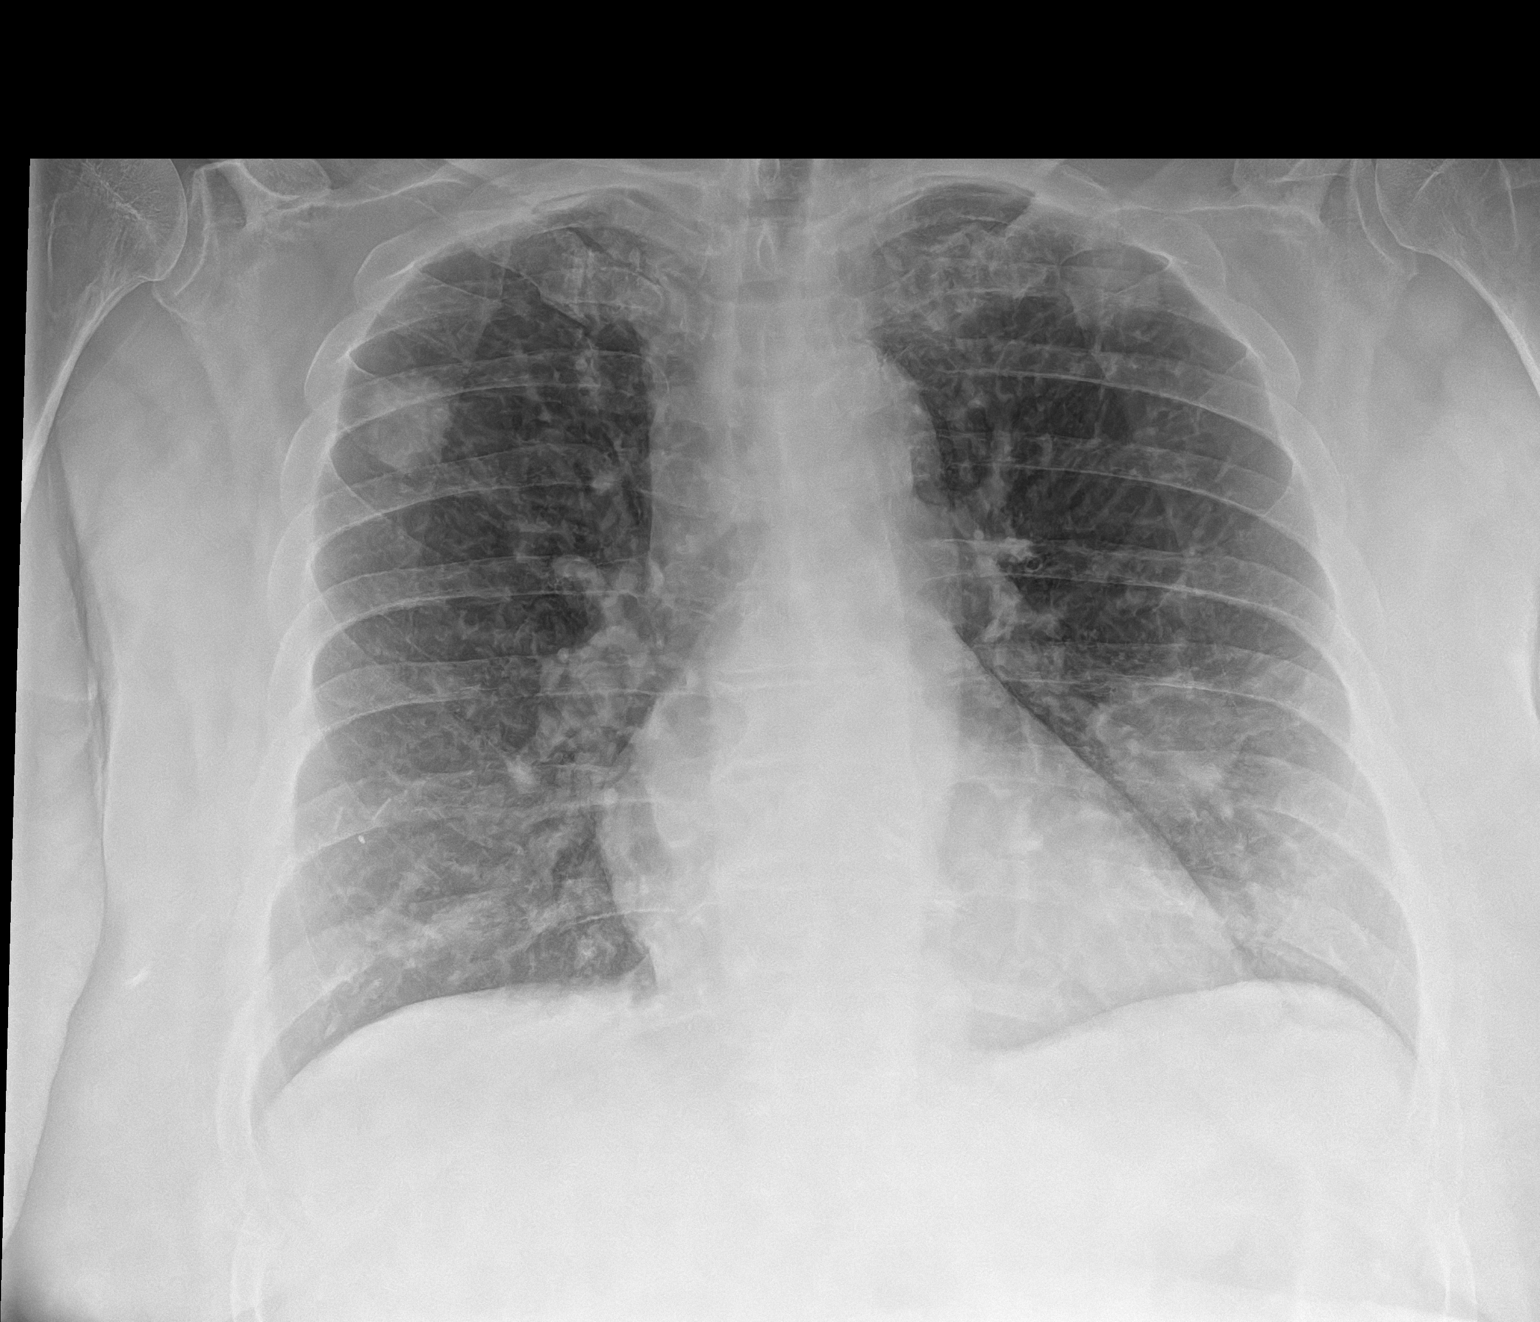

[1 of 1 positions shown; findings below may reference images not displayed]

FINDINGS: A 2.5 cm RIGHT UPPER lobe mass, a 2.5 cm LEFT UPPER lobe mass and a
3.5 cm mid-LOWER LEFT lung mass are new and suspicious for
metastatic disease. An equivocal mass at the MEDIAL LEFT lung base
is noted.

Fullness of the SUPERIOR mediastinum is noted.

The cardiopericardial silhouette is unremarkable.

No airspace disease, pleural effusion or pneumothorax.

No acute or suspicious bony abnormalities are present.
IMPRESSION: 1. New bilateral pulmonary masses and fullness of the SUPERIOR
mediastinum, suspicious for metastatic disease. Recommend CT with
contrast for further evaluation.

## 2022-05-15 IMAGING — CT CT ANGIO CHEST
2 of 6 series · 18 of 46 positions shown · IV contrast (omnipaque)
Comparison: [DATE] [DATE], [DATE] ([DATE] p.m.)

CLINICAL DATA: Abdominal pain and back pain.

EXAM:
CT ANGIOGRAPHY CHEST WITH CONTRAST
TECHNIQUE: Multidetector CT imaging of the chest was performed using the
standard protocol during bolus administration of intravenous
contrast. Multiplanar CT image reconstructions and MIPs were
obtained to evaluate the vascular anatomy.
CONTRAST:  80mL OMNIPAQUE IOHEXOL 350 MG/ML SOLN

[Series 5: pe axial thins · axial · 0.81mm/px · z∈[-456,-162]mm · 15 of 323 slices shown]
[im 15/323  lung]
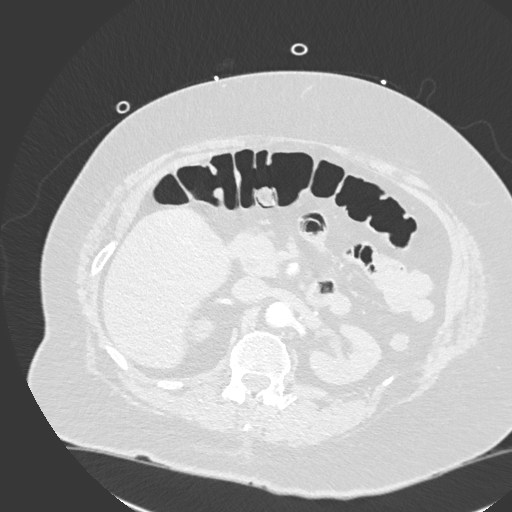
[im 43/323  soft-tissue]
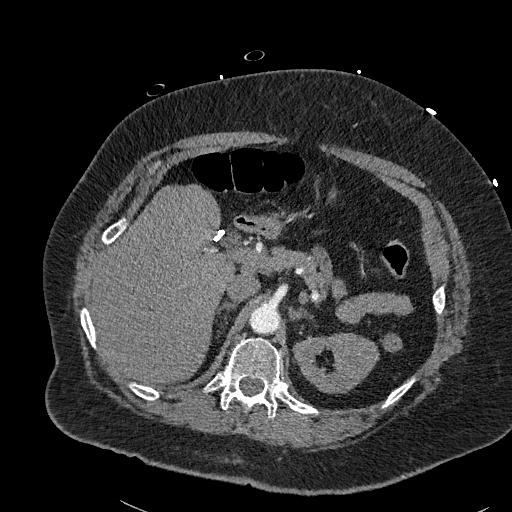
[im 57/323  lung]
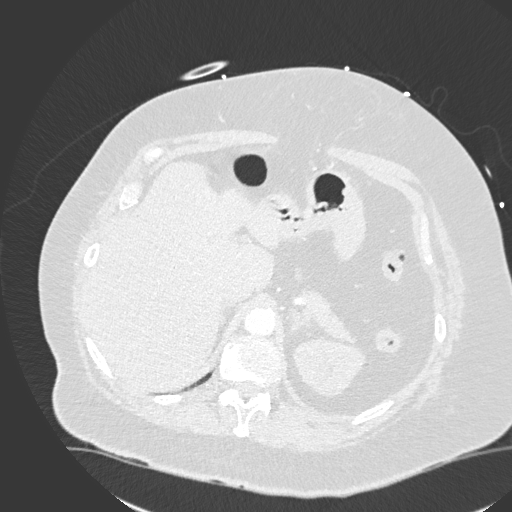
[im 85/323  soft-tissue]
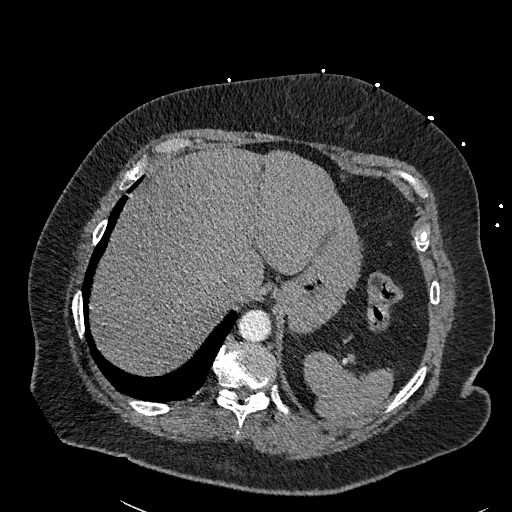
[im 99/323  lung]
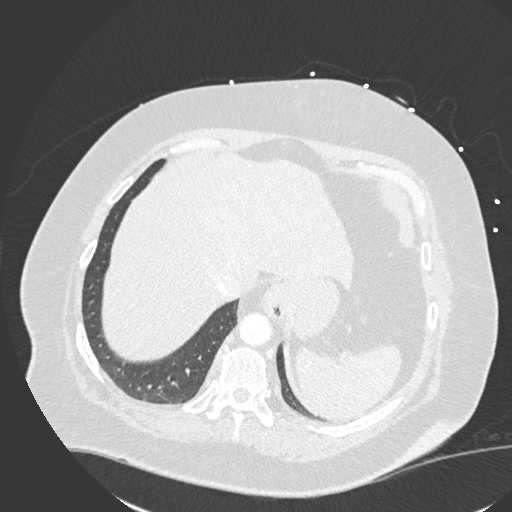
[im 127/323  soft-tissue]
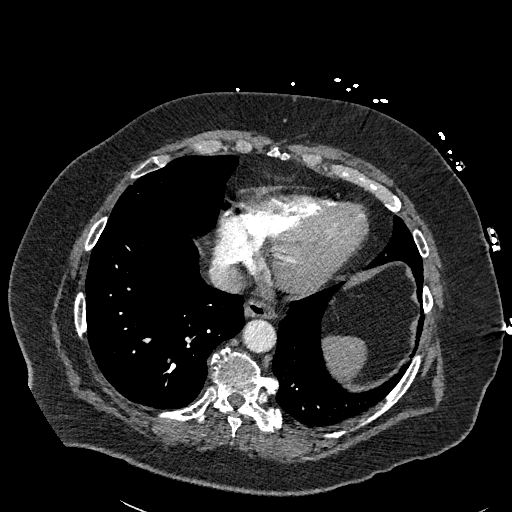
[im 141/323  lung]
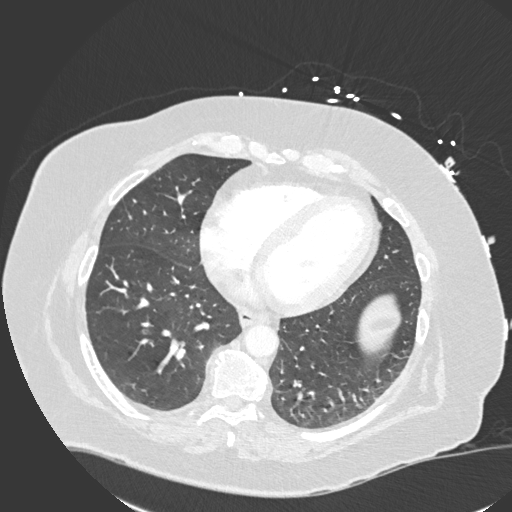
[im 169/323  soft-tissue]
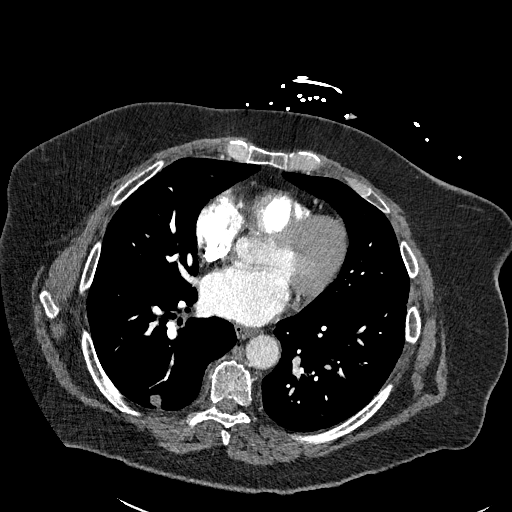
[im 183/323  lung]
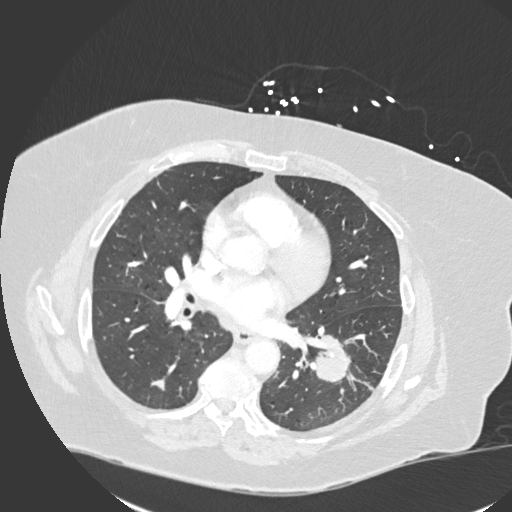
[im 197/323  soft-tissue]
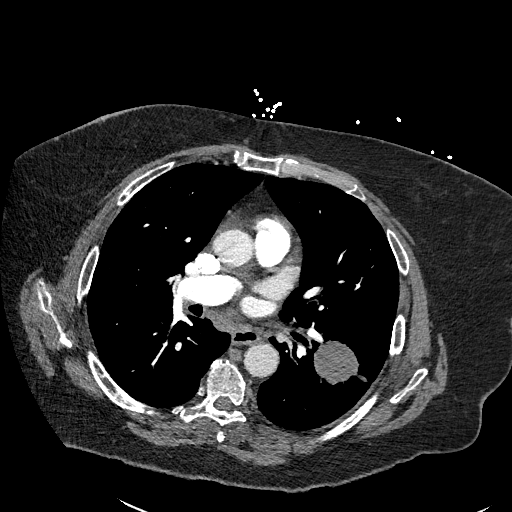
[im 225/323  lung]
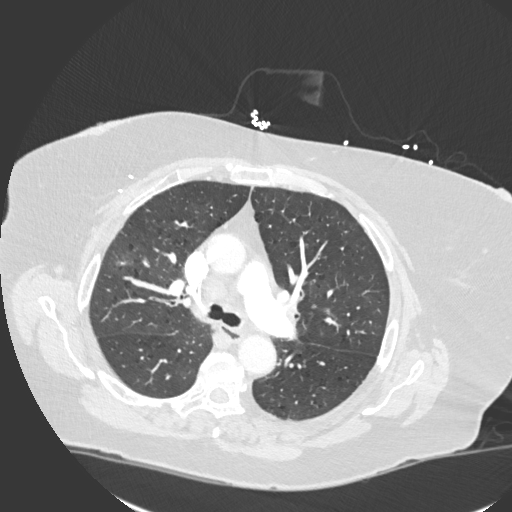
[im 239/323  soft-tissue]
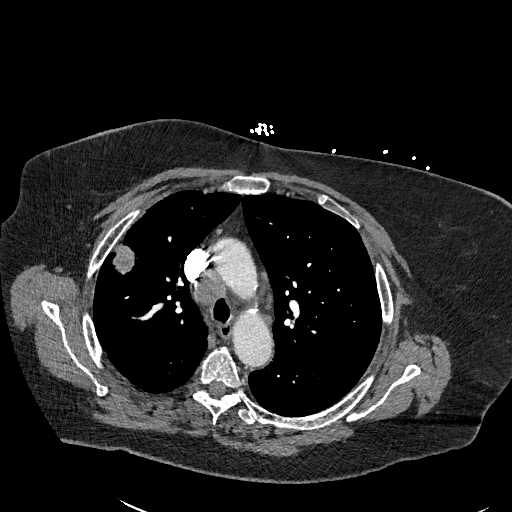
[im 267/323  lung]
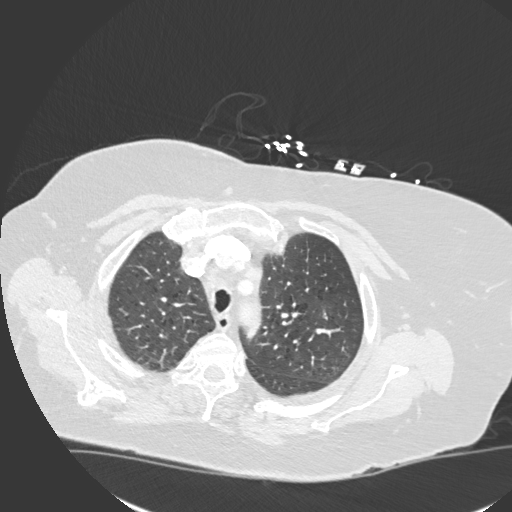
[im 281/323  soft-tissue]
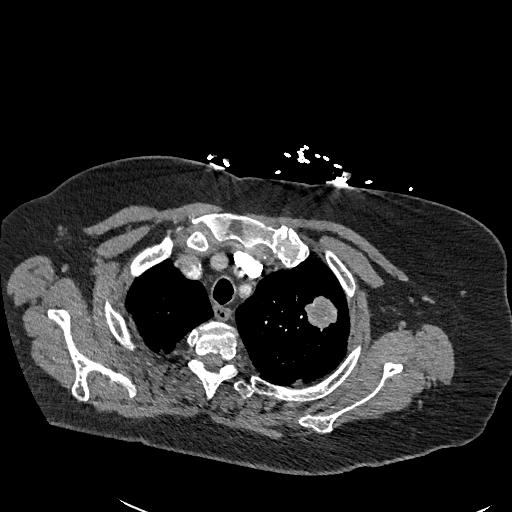
[im 309/323  lung]
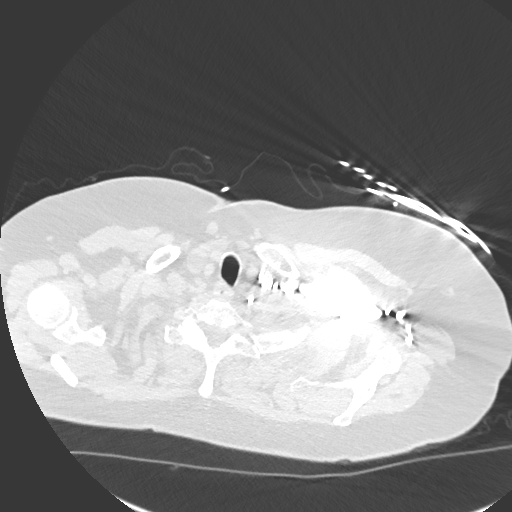

[Series 8: cor soft · coronal · 0.66mm/px · 3 of 171 slices shown]
[im 43/171  soft-tissue]
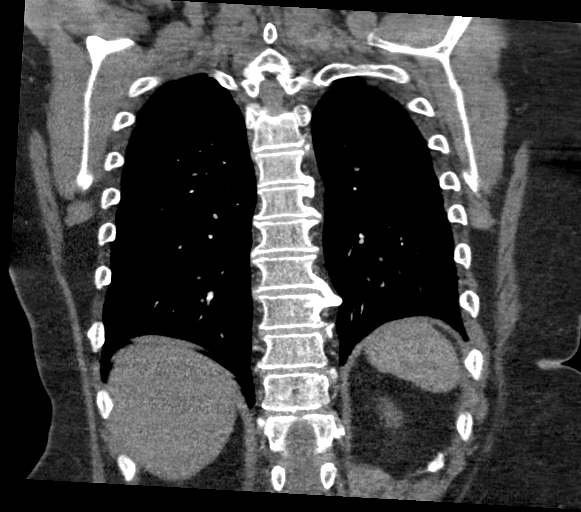
[im 86/171  soft-tissue]
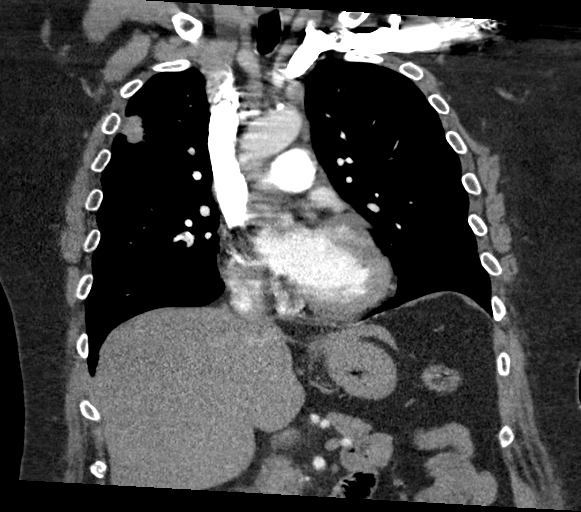
[im 128/171  soft-tissue]
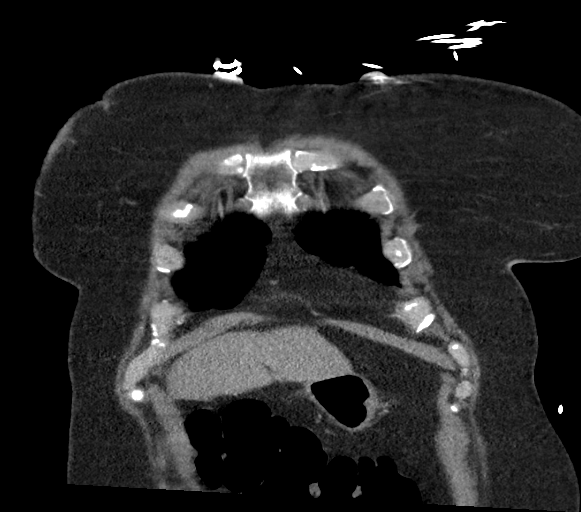

[18 of 46 positions shown; findings below may reference images not displayed]

FINDINGS: Cardiovascular: There is mild calcification of the aortic arch. The
pulmonary arteries are normal in appearance, without evidence of
intraluminal filling defects. Normal heart size. No pericardial
effusion.

Mediastinum/Nodes: There is a 3.2 cm x 2.8 cm pretracheal lymph
node. A 2.4 cm x 2.1 cm right hilar lymph node is also seen. Thyroid
gland, trachea, and esophagus demonstrate no significant findings.

Lungs/Pleura: A 2.9 cm x 2.8 cm lobulated noncalcified
low-attenuation lung mass is seen within the anterolateral aspect of
the left upper lobe.

An additional 2.3 cm x 1.9 cm low-attenuation mass is seen within
the right upper lobe.

A 3.7 cm x 3.6 cm low-attenuation lung mass is present within the
left lower lobe.

Multiple smaller noncalcified lung nodules are seen scattered
throughout both lungs.

There is no evidence of acute infiltrate, pleural effusion or
pneumothorax.

Upper Abdomen: There is a small hiatal hernia.

Multiple surgical clips are seen within the gallbladder fossa.

A 1.6 cm x 1.4 cm isodense left adrenal mass is noted.

Musculoskeletal: No chest wall mass or suspicious bone lesions
identified.
IMPRESSION: 1.  No evidence of pulmonary embolism.
2. Multiple bilateral low-attenuation lung masses with smaller
noncalcified lung nodules scattered throughout both lungs. These
findings are concerning for metastatic disease.
3.  Enlarged pretracheal and right hilar lymph nodes.
4. 1.6 cm x 1.4 cm isodense left adrenal mass which is likely
consistent with an adrenal adenoma.
5. Evidence of prior cholecystectomy.

## 2022-05-24 ENCOUNTER — Telehealth: Payer: Self-pay | Admitting: Family Medicine

## 2022-05-24 NOTE — Telephone Encounter (Signed)
Please have her come in for blood pressure check with nurse.  May need to start medication

## 2022-05-24 NOTE — Telephone Encounter (Signed)
Called and spoke with pt , she is unable to come in to the office due to heath reasons , she states she has a aid that comes daily and she is the one that called in the results after she checked her bp today   Pt is willing to go on medication if needed

## 2022-05-25 NOTE — Telephone Encounter (Signed)
These readings are stable. Continue to monitor and will hold off on medications at this time.

## 2022-05-25 NOTE — Telephone Encounter (Signed)
Patient aware and verbalized understanding. °

## 2022-05-25 NOTE — Telephone Encounter (Signed)
Tracy Neal calling back from Hospice to see if provider received her message from yesterday. Reviewed provider and nurse notes with her. Wants to know what provider wants to do?

## 2022-05-25 NOTE — Telephone Encounter (Signed)
Hoispice aware

## 2022-06-06 ENCOUNTER — Telehealth: Payer: Self-pay | Admitting: Family Medicine

## 2022-06-06 NOTE — Telephone Encounter (Signed)
Aware ppw is out our front desk ready to be picked up.

## 2022-06-06 NOTE — Telephone Encounter (Signed)
TC to [employer] w/ [facility] who requested ppw to be filled out to let her know it was ready to be picked up, will email it to her at [email or web address].

## 2022-06-08 ENCOUNTER — Telehealth: Payer: Self-pay | Admitting: Family Medicine

## 2022-06-08 DIAGNOSIS — G4700 Insomnia, unspecified: Secondary | ICD-10-CM

## 2022-06-08 DIAGNOSIS — M1711 Unilateral primary osteoarthritis, right knee: Secondary | ICD-10-CM

## 2022-06-08 DIAGNOSIS — C50919 Malignant neoplasm of unspecified site of unspecified female breast: Secondary | ICD-10-CM

## 2022-06-08 DIAGNOSIS — J439 Emphysema, unspecified: Secondary | ICD-10-CM

## 2022-06-08 MED ORDER — TEMAZEPAM 15 MG PO CAPS
15.0000 mg | ORAL_CAPSULE | Freq: Every evening | ORAL | 5 refills | Status: DC | PRN
Start: 2022-06-08 — End: 2022-06-08

## 2022-06-08 MED ORDER — ENSURE COMPLETE PO LIQD: 237.0000 mL | ORAL | 3 refills | Status: DC

## 2022-06-08 MED ORDER — TEMAZEPAM 15 MG PO CAPS
15.0000 mg | ORAL_CAPSULE | Freq: Every evening | ORAL | 5 refills | Status: DC | PRN
Start: 2022-06-08 — End: 2022-07-04

## 2022-06-08 MED ORDER — ALBUTEROL SULFATE HFA 108 (90 BASE) MCG/ACT IN AERS: 1.0000 | INHALATION_SPRAY | RESPIRATORY_TRACT | 2 refills | Status: DC | PRN

## 2022-06-08 MED ORDER — TEMAZEPAM 15 MG PO CAPS: 15.0000 mg | ORAL_CAPSULE | Freq: Every evening | ORAL | 5 refills | Status: DC | PRN

## 2022-06-08 MED ORDER — IBUPROFEN 800 MG PO TABS: 800.0000 mg | ORAL_TABLET | Freq: Three times a day (TID) | ORAL | 3 refills | Status: DC | PRN

## 2022-06-08 MED ORDER — UMECLIDINIUM BROMIDE 62.5 MCG/ACT IN AEPB
2.0000 | INHALATION_SPRAY | Freq: Every day | RESPIRATORY_TRACT | 3 refills | Status: DC
Start: 2022-06-08 — End: 2022-07-04

## 2022-06-08 MED ORDER — DICLOFENAC SODIUM 1 % EX GEL: 2.0000 g | Freq: Four times a day (QID) | CUTANEOUS | 3 refills | Status: DC | PRN

## 2022-06-08 NOTE — Telephone Encounter (Signed)
Restoril refill sent to [facility].

## 2022-06-08 NOTE — Telephone Encounter (Signed)
Hospice aware meds sent.

## 2022-06-08 NOTE — Telephone Encounter (Signed)
I refilled all meds to [facility]. Can you please send in the restoril?

## 2022-06-08 NOTE — Telephone Encounter (Signed)
[facility] called stating that patient is having issues with getting her meds filled at [facility]. Since pt is under Hospice care now, they are requesting that all meds/refills be transferred to [facility] for delivery.

## 2022-06-10 DIAGNOSIS — Z8739 Personal history of other diseases of the musculoskeletal system and connective tissue: Secondary | ICD-10-CM | POA: Diagnosis not present

## 2022-06-10 DIAGNOSIS — Z66 Do not resuscitate: Secondary | ICD-10-CM | POA: Diagnosis not present

## 2022-06-10 DIAGNOSIS — K439 Ventral hernia without obstruction or gangrene: Secondary | ICD-10-CM | POA: Diagnosis not present

## 2022-06-10 DIAGNOSIS — J449 Chronic obstructive pulmonary disease, unspecified: Secondary | ICD-10-CM | POA: Diagnosis not present

## 2022-06-10 DIAGNOSIS — C7951 Secondary malignant neoplasm of bone: Secondary | ICD-10-CM | POA: Diagnosis not present

## 2022-06-10 DIAGNOSIS — K209 Esophagitis, unspecified without bleeding: Secondary | ICD-10-CM | POA: Diagnosis not present

## 2022-06-10 DIAGNOSIS — R112 Nausea with vomiting, unspecified: Secondary | ICD-10-CM | POA: Diagnosis not present

## 2022-06-10 DIAGNOSIS — Z885 Allergy status to narcotic agent status: Secondary | ICD-10-CM | POA: Diagnosis not present

## 2022-06-10 DIAGNOSIS — E876 Hypokalemia: Secondary | ICD-10-CM | POA: Diagnosis not present

## 2022-06-10 DIAGNOSIS — R7402 Elevation of levels of lactic acid dehydrogenase (LDH): Secondary | ICD-10-CM | POA: Diagnosis not present

## 2022-06-10 DIAGNOSIS — C50919 Malignant neoplasm of unspecified site of unspecified female breast: Secondary | ICD-10-CM | POA: Diagnosis not present

## 2022-06-10 DIAGNOSIS — F17200 Nicotine dependence, unspecified, uncomplicated: Secondary | ICD-10-CM | POA: Diagnosis not present

## 2022-06-10 DIAGNOSIS — Z7951 Long term (current) use of inhaled steroids: Secondary | ICD-10-CM | POA: Diagnosis not present

## 2022-06-10 DIAGNOSIS — R1111 Vomiting without nausea: Secondary | ICD-10-CM | POA: Diagnosis not present

## 2022-06-10 DIAGNOSIS — C7800 Secondary malignant neoplasm of unspecified lung: Secondary | ICD-10-CM | POA: Diagnosis not present

## 2022-06-10 DIAGNOSIS — R7989 Other specified abnormal findings of blood chemistry: Secondary | ICD-10-CM | POA: Diagnosis not present

## 2022-06-10 DIAGNOSIS — C7931 Secondary malignant neoplasm of brain: Secondary | ICD-10-CM | POA: Diagnosis not present

## 2022-06-10 DIAGNOSIS — Z87891 Personal history of nicotine dependence: Secondary | ICD-10-CM | POA: Diagnosis not present

## 2022-06-10 DIAGNOSIS — Z515 Encounter for palliative care: Secondary | ICD-10-CM | POA: Diagnosis not present

## 2022-06-10 DIAGNOSIS — Z7952 Long term (current) use of systemic steroids: Secondary | ICD-10-CM | POA: Diagnosis not present

## 2022-06-10 DIAGNOSIS — Z9981 Dependence on supplemental oxygen: Secondary | ICD-10-CM | POA: Diagnosis not present

## 2022-06-10 DIAGNOSIS — J441 Chronic obstructive pulmonary disease with (acute) exacerbation: Secondary | ICD-10-CM | POA: Diagnosis not present

## 2022-06-10 DIAGNOSIS — A419 Sepsis, unspecified organism: Secondary | ICD-10-CM | POA: Diagnosis not present

## 2022-06-10 DIAGNOSIS — R0689 Other abnormalities of breathing: Secondary | ICD-10-CM | POA: Diagnosis not present

## 2022-06-10 DIAGNOSIS — R6889 Other general symptoms and signs: Secondary | ICD-10-CM | POA: Diagnosis not present

## 2022-06-10 DIAGNOSIS — Z9221 Personal history of antineoplastic chemotherapy: Secondary | ICD-10-CM | POA: Diagnosis not present

## 2022-06-10 DIAGNOSIS — R1084 Generalized abdominal pain: Secondary | ICD-10-CM | POA: Diagnosis not present

## 2022-06-10 DIAGNOSIS — Z9049 Acquired absence of other specified parts of digestive tract: Secondary | ICD-10-CM | POA: Diagnosis not present

## 2022-06-10 DIAGNOSIS — R0602 Shortness of breath: Secondary | ICD-10-CM | POA: Diagnosis not present

## 2022-06-10 DIAGNOSIS — Z7901 Long term (current) use of anticoagulants: Secondary | ICD-10-CM | POA: Diagnosis not present

## 2022-06-10 DIAGNOSIS — I2699 Other pulmonary embolism without acute cor pulmonale: Secondary | ICD-10-CM | POA: Diagnosis not present

## 2022-06-10 DIAGNOSIS — Z791 Long term (current) use of non-steroidal anti-inflammatories (NSAID): Secondary | ICD-10-CM | POA: Diagnosis not present

## 2022-06-10 DIAGNOSIS — I517 Cardiomegaly: Secondary | ICD-10-CM | POA: Diagnosis not present

## 2022-06-10 DIAGNOSIS — Z792 Long term (current) use of antibiotics: Secondary | ICD-10-CM | POA: Diagnosis not present

## 2022-06-10 DIAGNOSIS — Z743 Need for continuous supervision: Secondary | ICD-10-CM | POA: Diagnosis not present

## 2022-06-10 DIAGNOSIS — R7401 Elevation of levels of liver transaminase levels: Secondary | ICD-10-CM | POA: Diagnosis not present

## 2022-06-10 DIAGNOSIS — I499 Cardiac arrhythmia, unspecified: Secondary | ICD-10-CM | POA: Diagnosis not present

## 2022-06-10 DIAGNOSIS — R519 Headache, unspecified: Secondary | ICD-10-CM | POA: Diagnosis not present

## 2022-06-10 DIAGNOSIS — M8580 Other specified disorders of bone density and structure, unspecified site: Secondary | ICD-10-CM | POA: Diagnosis not present

## 2022-06-10 DIAGNOSIS — D72829 Elevated white blood cell count, unspecified: Secondary | ICD-10-CM | POA: Diagnosis not present

## 2022-06-10 DIAGNOSIS — Z20822 Contact with and (suspected) exposure to covid-19: Secondary | ICD-10-CM | POA: Diagnosis not present

## 2022-06-14 ENCOUNTER — Telehealth: Payer: Self-pay | Admitting: Family Medicine

## 2022-06-14 NOTE — Telephone Encounter (Signed)
Tabitha from Hospice called back and I read the message from Dr. Louanne Skye.  She said the patient was under [facility] and that she could write the medication this time, but needs to know for future if she is to continue to do that.  Please call.

## 2022-06-14 NOTE — Telephone Encounter (Signed)
Per Tabitha, Hospice will continue to prescribe Oxy 15 for the patient. Will make Dr. Louanne Skye aware.

## 2022-06-14 NOTE — Telephone Encounter (Signed)
  Prescription Request  06/14/2022  Is this a "Controlled Substance" medicine? yes  Have you seen your PCP in the last 2 weeks? Pt last seen 03/24/22 told to f/u in 6 months she was getting this medication from palliative care but she is now in hospice care wants to know if Dr. Algis Downs. Can prescribe this for her, if he will not please call hospice back and they will prescribe for her   If YES, route message to pool  -  If NO, patient needs to be scheduled for appointment.  What is the name of the medication or equipment? Oxycodone 15mg  every 4 hours as needed  Have you contacted your pharmacy to request a refill? no   Which pharmacy would you like this sent to? [facility]   Patient notified that their request is being sent to the clinical staff for review and that they should receive a response within 2 business days.

## 2022-06-14 NOTE — Telephone Encounter (Signed)
If she is hospice hospice send "orders to [place] for managing care.  If she is on hospice and we are managing then we can prescribe this but I will need the hospice paperwork showing that were not the managing provider or if she has a hospice doctor then they can manage.  I do not know what company she went through for hospice but sometimes we do manage and sometimes they manage.

## 2022-06-14 NOTE — Telephone Encounter (Signed)
That depends on their program and how they want to do it but it is a possibility that we can write for it or they can write for it, they just have to let us know what they prefer, I am good with continuing this medicine for the patient with her cancer

## 2022-06-23 ENCOUNTER — Telehealth: Payer: Self-pay | Admitting: Family Medicine

## 2022-06-23 MED ORDER — PROMETHAZINE HCL 25 MG RE SUPP: 25.0000 mg | Freq: Four times a day (QID) | RECTAL | 1 refills | Status: DC | PRN

## 2022-06-23 NOTE — Telephone Encounter (Signed)
Tabitha with Hospice made aware.

## 2022-06-23 NOTE — Addendum Note (Signed)
Addended by: [place] on: 06/23/2022 10:48 AM   Modules accepted: Orders

## 2022-06-23 NOTE — Telephone Encounter (Signed)
Please advise 

## 2022-06-23 NOTE — Telephone Encounter (Signed)
I sent Phenergan suppository to the pharmacy for her

## 2022-07-17 DEATH — deceased

## 2023-07-09 ENCOUNTER — Other Ambulatory Visit: Payer: Self-pay
